# Patient Record
Sex: Female | Born: 1985 | Race: Black or African American | Hispanic: No | Marital: Married | Smoking: Never smoker
Health system: Southern US, Community
[De-identification: ages and names within clinical notes are randomized; demographics above are authoritative.]

## PROBLEM LIST (undated history)

## (undated) ENCOUNTER — Inpatient Hospital Stay (HOSPITAL_COMMUNITY): Payer: Self-pay

## (undated) DIAGNOSIS — D649 Anemia, unspecified: Secondary | ICD-10-CM

## (undated) DIAGNOSIS — E039 Hypothyroidism, unspecified: Secondary | ICD-10-CM

## (undated) DIAGNOSIS — E669 Obesity, unspecified: Secondary | ICD-10-CM

## (undated) DIAGNOSIS — Z832 Family history of diseases of the blood and blood-forming organs and certain disorders involving the immune mechanism: Secondary | ICD-10-CM

## (undated) DIAGNOSIS — Z531 Procedure and treatment not carried out because of patient's decision for reasons of belief and group pressure: Secondary | ICD-10-CM

## (undated) DIAGNOSIS — I10 Essential (primary) hypertension: Secondary | ICD-10-CM

## (undated) DIAGNOSIS — I2699 Other pulmonary embolism without acute cor pulmonale: Principal | ICD-10-CM

## (undated) DIAGNOSIS — R51 Headache: Secondary | ICD-10-CM

## (undated) DIAGNOSIS — Z8249 Family history of ischemic heart disease and other diseases of the circulatory system: Secondary | ICD-10-CM

## (undated) DIAGNOSIS — T7840XA Allergy, unspecified, initial encounter: Secondary | ICD-10-CM

## (undated) DIAGNOSIS — D689 Coagulation defect, unspecified: Secondary | ICD-10-CM

## (undated) DIAGNOSIS — O139 Gestational [pregnancy-induced] hypertension without significant proteinuria, unspecified trimester: Secondary | ICD-10-CM

## (undated) HISTORY — DX: Family history of ischemic heart disease and other diseases of the circulatory system: Z82.49

## (undated) HISTORY — DX: Procedure and treatment not carried out because of patient's decision for reasons of belief and group pressure: Z53.1

## (undated) HISTORY — DX: Other pulmonary embolism without acute cor pulmonale: I26.99

## (undated) HISTORY — PX: WRIST SURGERY: SHX841

## (undated) HISTORY — DX: Obesity, unspecified: E66.9

## (undated) HISTORY — DX: Allergy, unspecified, initial encounter: T78.40XA

## (undated) HISTORY — DX: Anemia, unspecified: D64.9

## (undated) HISTORY — DX: Coagulation defect, unspecified: D68.9

## (undated) HISTORY — PX: WISDOM TOOTH EXTRACTION: SHX21

## (undated) HISTORY — DX: Hypothyroidism, unspecified: E03.9

## (undated) HISTORY — DX: Family history of diseases of the blood and blood-forming organs and certain disorders involving the immune mechanism: Z83.2

---
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
## 2006-01-20 ENCOUNTER — Encounter: Admission: RE | Admit: 2006-01-20 | Discharge: 2006-01-20 | Payer: Self-pay | Admitting: Orthopedic Surgery

## 2006-08-10 ENCOUNTER — Other Ambulatory Visit: Admission: RE | Admit: 2006-08-10 | Discharge: 2006-08-10 | Payer: Self-pay | Admitting: Obstetrics & Gynecology

## 2006-10-19 ENCOUNTER — Other Ambulatory Visit: Admission: RE | Admit: 2006-10-19 | Discharge: 2006-10-19 | Payer: Self-pay | Admitting: Obstetrics & Gynecology

## 2008-01-14 ENCOUNTER — Other Ambulatory Visit: Admission: RE | Admit: 2008-01-14 | Discharge: 2008-01-14 | Payer: Self-pay | Admitting: Obstetrics & Gynecology

## 2008-04-04 ENCOUNTER — Emergency Department (HOSPITAL_COMMUNITY): Admission: EM | Admit: 2008-04-04 | Discharge: 2008-04-05 | Payer: Self-pay | Admitting: Emergency Medicine

## 2008-04-09 ENCOUNTER — Ambulatory Visit (HOSPITAL_COMMUNITY): Admission: RE | Admit: 2008-04-09 | Discharge: 2008-04-09 | Payer: Self-pay | Admitting: Family Medicine

## 2009-02-09 ENCOUNTER — Other Ambulatory Visit: Admission: RE | Admit: 2009-02-09 | Discharge: 2009-02-09 | Payer: Self-pay | Admitting: Obstetrics and Gynecology

## 2009-03-30 ENCOUNTER — Ambulatory Visit (HOSPITAL_COMMUNITY): Admission: RE | Admit: 2009-03-30 | Discharge: 2009-03-30 | Payer: Self-pay | Admitting: Family Medicine

## 2010-10-31 ENCOUNTER — Encounter: Payer: Self-pay | Admitting: Family Medicine

## 2011-02-02 ENCOUNTER — Inpatient Hospital Stay (HOSPITAL_COMMUNITY)
Admission: EM | Admit: 2011-02-02 | Discharge: 2011-02-05 | DRG: 078 | Disposition: A | Discharge status: Home / Self Care | Payer: BC Managed Care – PPO | Attending: Otolaryngology | Admitting: Otolaryngology

## 2011-02-02 ENCOUNTER — Emergency Department (HOSPITAL_COMMUNITY): Payer: BC Managed Care – PPO

## 2011-02-02 DIAGNOSIS — Z309 Encounter for contraceptive management, unspecified: Secondary | ICD-10-CM

## 2011-02-02 DIAGNOSIS — Z975 Presence of (intrauterine) contraceptive device: Secondary | ICD-10-CM

## 2011-02-02 DIAGNOSIS — E669 Obesity, unspecified: Secondary | ICD-10-CM | POA: Diagnosis present

## 2011-02-02 DIAGNOSIS — I2699 Other pulmonary embolism without acute cor pulmonale: Principal | ICD-10-CM | POA: Diagnosis present

## 2011-02-02 DIAGNOSIS — E039 Hypothyroidism, unspecified: Secondary | ICD-10-CM | POA: Diagnosis present

## 2011-02-02 LAB — BASIC METABOLIC PANEL
BUN: 8 mg/dL (ref 6–23)
CO2: 24 mEq/L (ref 19–32)
Calcium: 9.6 mg/dL (ref 8.4–10.5)
Chloride: 103 mEq/L (ref 96–112)
Creatinine, Ser: 0.81 mg/dL (ref 0.4–1.2)
GFR calc Af Amer: >60 mL/min (ref >60)
GFR calc non Af Amer: >60 mL/min (ref >60)
Glucose, Bld: 81 mg/dL (ref 70–99)
Potassium: 4 mEq/L (ref 3.5–5.1)
Sodium: 137 mEq/L (ref 135–145)

## 2011-02-02 LAB — DIFFERENTIAL
Basophils Absolute: 0 10*3/uL (ref 0.0–0.1)
Basophils Relative: 0 % (ref 0–1)
Eosinophils Absolute: 0.1 10*3/uL (ref 0.0–0.7)
Eosinophils Relative: 1 % (ref 0–5)
Lymphocytes Relative: 23 % (ref 12–46)
Lymphs Abs: 2.8 10*3/uL (ref 0.7–4.0)
Monocytes Absolute: 0.7 10*3/uL (ref 0.1–1.0)
Monocytes Relative: 6 % (ref 3–12)
Neutro Abs: 8.4 10*3/uL — ABNORMAL HIGH (ref 1.7–7.7)
Neutrophils Relative %: 70 % (ref 43–77)

## 2011-02-02 LAB — CARDIAC PANEL(CRET KIN+CKTOT+MB+TROPI)
CK, MB: 0.5 ng/mL (ref 0.3–4.0)
Relative Index: INVALID (ref 0.0–2.5)
Total CK: 53 U/L (ref 7–177)
Troponin I: 0.01 ng/mL (ref 0.00–0.06)

## 2011-02-02 LAB — CBC
HCT: 42.1 % (ref 36.0–46.0)
Hemoglobin: 13.7 g/dL (ref 12.0–15.0)
MCH: 27.2 pg (ref 26.0–34.0)
MCHC: 32.5 g/dL (ref 30.0–36.0)
MCV: 83.5 fL (ref 78.0–100.0)
Platelets: 192 10*3/uL (ref 150–400)
RBC: 5.04 MIL/uL (ref 3.87–5.11)
RDW: 14.4 % (ref 11.5–15.5)
WBC: 12 10*3/uL — ABNORMAL HIGH (ref 4.0–10.5)

## 2011-02-02 LAB — D-DIMER, QUANTITATIVE: D-Dimer, Quant: 8.65 ug/mL-FEU — ABNORMAL HIGH (ref 0.00–0.48)

## 2011-02-02 LAB — POCT PREGNANCY, URINE: Preg Test, Ur: NEGATIVE

## 2011-02-02 MED ORDER — IOHEXOL 350 MG/ML SOLN
100.0000 mL | Freq: Once | INTRAVENOUS | Status: AC | PRN
  Administered 2011-02-02: 100 mL via INTRAVENOUS

## 2011-02-03 DIAGNOSIS — I369 Nonrheumatic tricuspid valve disorder, unspecified: Secondary | ICD-10-CM

## 2011-02-03 LAB — TSH: TSH: 4.023 u[IU]/mL (ref 0.350–4.500)

## 2011-02-03 LAB — HEPATIC FUNCTION PANEL
ALT: 17 U/L (ref 0–35)
AST: 13 U/L (ref 0–37)
Albumin: 2.9 g/dL — ABNORMAL LOW (ref 3.5–5.2)
Alkaline Phosphatase: 66 U/L (ref 39–117)
Bilirubin, Direct: <0.1 mg/dL (ref 0.0–0.3)
Total Bilirubin: 0.2 mg/dL — ABNORMAL LOW (ref 0.3–1.2)
Total Protein: 6.8 g/dL (ref 6.0–8.3)

## 2011-02-03 LAB — CBC
HCT: 38.3 % (ref 36.0–46.0)
Hemoglobin: 12.4 g/dL (ref 12.0–15.0)
MCH: 26.8 pg (ref 26.0–34.0)
MCHC: 32.4 g/dL (ref 30.0–36.0)
MCV: 82.7 fL (ref 78.0–100.0)
Platelets: 184 10*3/uL (ref 150–400)
RBC: 4.63 MIL/uL (ref 3.87–5.11)
RDW: 14.1 % (ref 11.5–15.5)
WBC: 10.1 10*3/uL (ref 4.0–10.5)

## 2011-02-03 LAB — CARDIAC PANEL(CRET KIN+CKTOT+MB+TROPI)
CK, MB: 0.9 ng/mL (ref 0.3–4.0)
Relative Index: INVALID (ref 0.0–2.5)
Total CK: 44 U/L (ref 7–177)
Troponin I: 0.02 ng/mL (ref 0.00–0.06)

## 2011-02-03 LAB — PROTIME-INR
INR: 1.07 (ref 0.00–1.49)
Prothrombin Time: 14.1 seconds (ref 11.6–15.2)

## 2011-02-03 LAB — MRSA PCR SCREENING: MRSA by PCR: NEGATIVE

## 2011-02-03 LAB — DIFFERENTIAL
Basophils Absolute: 0 10*3/uL (ref 0.0–0.1)
Basophils Relative: 0 % (ref 0–1)
Eosinophils Absolute: 0.1 10*3/uL (ref 0.0–0.7)
Eosinophils Relative: 1 % (ref 0–5)
Lymphocytes Relative: 33 % (ref 12–46)
Lymphs Abs: 3.3 10*3/uL (ref 0.7–4.0)
Monocytes Absolute: 0.6 10*3/uL (ref 0.1–1.0)
Monocytes Relative: 6 % (ref 3–12)
Neutro Abs: 6.1 10*3/uL (ref 1.7–7.7)
Neutrophils Relative %: 60 % (ref 43–77)

## 2011-02-03 LAB — T4, FREE: Free T4: 1.18 ng/dL (ref 0.80–1.80)

## 2011-02-04 LAB — HOMOCYSTEINE: Homocysteine: 6.5 umol/L (ref 4.0–15.4)

## 2011-02-04 LAB — CBC
HCT: 37.8 % (ref 36.0–46.0)
Hemoglobin: 12.1 g/dL (ref 12.0–15.0)
MCH: 26.5 pg (ref 26.0–34.0)
MCHC: 32 g/dL (ref 30.0–36.0)
MCV: 82.9 fL (ref 78.0–100.0)
Platelets: 208 10*3/uL (ref 150–400)
RBC: 4.56 MIL/uL (ref 3.87–5.11)
RDW: 14.2 % (ref 11.5–15.5)
WBC: 9.6 10*3/uL (ref 4.0–10.5)

## 2011-02-04 LAB — DIFFERENTIAL
Basophils Absolute: 0 10*3/uL (ref 0.0–0.1)
Basophils Relative: 0 % (ref 0–1)
Eosinophils Absolute: 0.1 10*3/uL (ref 0.0–0.7)
Eosinophils Relative: 1 % (ref 0–5)
Lymphocytes Relative: 37 % (ref 12–46)
Lymphs Abs: 3.6 10*3/uL (ref 0.7–4.0)
Monocytes Absolute: 0.6 10*3/uL (ref 0.1–1.0)
Monocytes Relative: 6 % (ref 3–12)
Neutro Abs: 5.3 10*3/uL (ref 1.7–7.7)
Neutrophils Relative %: 55 % (ref 43–77)

## 2011-02-04 LAB — COMPREHENSIVE METABOLIC PANEL
ALT: 16 U/L (ref 0–35)
AST: 12 U/L (ref 0–37)
Albumin: 3 g/dL — ABNORMAL LOW (ref 3.5–5.2)
Alkaline Phosphatase: 63 U/L (ref 39–117)
BUN: 5 mg/dL — ABNORMAL LOW (ref 6–23)
CO2: 24 mEq/L (ref 19–32)
Calcium: 9.1 mg/dL (ref 8.4–10.5)
Chloride: 103 mEq/L (ref 96–112)
Creatinine, Ser: 0.71 mg/dL (ref 0.4–1.2)
GFR calc Af Amer: >60 mL/min (ref >60)
GFR calc non Af Amer: >60 mL/min (ref >60)
Glucose, Bld: 98 mg/dL (ref 70–99)
Potassium: 3.4 mEq/L — ABNORMAL LOW (ref 3.5–5.1)
Sodium: 135 mEq/L (ref 135–145)
Total Bilirubin: 0.2 mg/dL — ABNORMAL LOW (ref 0.3–1.2)
Total Protein: 7 g/dL (ref 6.0–8.3)

## 2011-02-04 LAB — LUPUS ANTICOAGULANT PANEL
DRVVT: 29.7 secs — ABNORMAL LOW (ref 36.2–44.3)
Lupus Anticoagulant: NOT DETECTED
PTT Lupus Anticoagulant: 40.6 secs (ref 30.0–45.6)

## 2011-02-04 LAB — CARDIAC PANEL(CRET KIN+CKTOT+MB+TROPI)
CK, MB: 0.3 ng/mL (ref 0.3–4.0)
Relative Index: INVALID (ref 0.0–2.5)
Total CK: 39 U/L (ref 7–177)
Troponin I: 0.01 ng/mL (ref 0.00–0.06)

## 2011-02-04 LAB — PROTIME-INR
INR: 1.23 (ref 0.00–1.49)
Prothrombin Time: 15.7 seconds — ABNORMAL HIGH (ref 11.6–15.2)

## 2011-02-05 LAB — PROTIME-INR
INR: 1.44 (ref 0.00–1.49)
Prothrombin Time: 17.7 seconds — ABNORMAL HIGH (ref 11.6–15.2)

## 2011-02-05 NOTE — H&P (Signed)
NAME:  ASTRID, VIDES             ACCOUNT NO.:  [id number]  MEDICAL RECORD NO.:  [id number]           PATIENT TYPE:  I  LOCATION:  IC07                          FACILITY:  [facility]  PHYSICIAN:  Tarry Kos, MD       DATE OF BIRTH:  1986-02-01  DATE OF ADMISSION:  02/02/2011 DATE OF DISCHARGE:  LH                             HISTORY & PHYSICAL   CHIEF COMPLAINT:  Right-sided pleuritic chest pain.  HISTORY OF PRESENT ILLNESS:  Ms. Maves is a pleasant 25 year old African American female who presented to the emergency department after starting to experience some right-sided pleuritic chest pain this morning when she woke up.  She said the chest pain is in the right side of the lower chest, radiates from the back to the front of the chest. She has been having some shortness of breath and came to the emergency department for further evaluation.  She herself does not have any history of blood clots or cardiac history in her past.  She denies any recent trauma, any recent traveling.  She is on NuraRing right now and she has been on estrogen birth control pills since she was around 40 years old.  Her mother does have a history of a blood clots in her 36s due to birth control pills.  Ms. Holzheimer came to the emergency department and was found to have bilateral pulmonary emboli.  She denies any fevers.  Again, she denies any recent trauma.  There is no provoking factors that I can find in her history.  REVIEW OF SYSTEMS:  Otherwise, negative.  PAST MEDICAL HISTORY:  Hypothyroidism.  MEDICATIONS:  NuraRing and Synthroid 88 mcg a day.  FAMILY HISTORY:  She does not smoke no, alcohol and no IV drug abuse. Her mother had a blood clot in her 61s.  She was told that it was due to birth control.  She also has 2 aunts that had blood clots in their 20s and 30s.  There is no coagulable disorders that she knows of in her family.  Her primary care physician when she was 25 years old did some sort  of hypercoagulable workup on her before starting her on a birth control and she says it was normal.  ALLERGIES:  None.  PHYSICAL EXAMINATION:  VITALS:  Temperature is 98.6, blood pressure 133/80, pulse 86, respirations 24 and 99% O2 sats on room air. GENERAL:  Alert and oriented x4.  No apparent distress, cooperative and friendly. HEART:  Regular rhythm without murmurs, rubs or gallops. CHEST:  Clear to auscultation bilaterally.  No wheeze, rhonchi, or rales. ABDOMEN:  Soft, nontender and nondistended.  Positive bowel sounds.  No hepatosplenomegaly. EXTREMITIES:  No clubbing, cyanosis, or edema. PSYCH:  Normal affect. NEUROLOGIC:  No focal neurologic deficits. SKIN:  No rashes.  LABORATORY DATA:  White count is 12, hemoglobin is normal.  Urine pregnancy test is negative.  D-dimer was 8.65.  BMP was normal.  CT of her chest shows extensive bilateral pulmonary emboli greater on the right.  Peripheral right lower lobe airspace disease is more worrisome for infarct.  She has been given Lovenox in the  emergency department.  ASSESSMENT AND PLAN:  This is a 25 year old female with acute bilateral pulmonary emboli. Acute bilateral pulmonary emboli extensive.  I am going to place her on Lovenox.  We will make subcu q.12 h. and load her with Coumadin 10 mg p.o. now and in the morning.  Obtain daily INRs and also check an echo for any right heart strain and also serial her cardiac enzymes.  I have asked her to remove her NuraRing and told her that she could be not be on estrogen again in the future.  Also, have gone over the risks and benefits of Coumadin treatment.  She will need at least 6 months to a year of Coumadin treatment to treat this pulmonary emboli and would certainly need followup with a hematologist once she is off of the Lovenox and Coumadin.  When they stop her treatment, I would definitely do an extensive hypercoagulable workup on her as she likely does have an underlying  disorder particularly with her strong family history.  I have also instructed whether she needs to use other forms of birth control as Coumadin does cause the birth defects in pregnancy, she is aware of that.  She has been placed in ICU for very close observation.  She is currently hemodynamically stable with normal vital signs.  Further recommendations depending on overall hospital course.                                           ______________________________ Tarry Kos, MD     RD/MEDQ  D:  02/02/2011  T:  02/03/2011  Job:  [id number]  Electronically Signed by Tarry Kos MD on 02/05/2011 09:47:54 PM

## 2011-02-07 LAB — FACTOR 5 LEIDEN

## 2011-02-08 LAB — CARDIOLIPIN ANTIBODIES, IGG, IGM, IGA
Anticardiolipin IgA: 3 APL U/mL — ABNORMAL LOW (ref <22)
Anticardiolipin IgG: 2 GPL U/mL — ABNORMAL LOW (ref <23)
Anticardiolipin IgM: 4 MPL U/mL — ABNORMAL LOW (ref <11)

## 2011-02-14 NOTE — Discharge Summary (Signed)
NAME:  Morgan Nielsen, Morgan Nielsen             ACCOUNT NO.:  [id number]  MEDICAL RECORD NO.:  [id number]           PATIENT TYPE:  I  LOCATION:  A211                          FACILITY:  [facility]  PHYSICIAN:  Kailiana Granquist L. Lendell Caprice, MDDATE OF BIRTH:  06/02/1986  DATE OF ADMISSION:  02/02/2011 DATE OF DISCHARGE:  LH                              DISCHARGE SUMMARY   DISCHARGE DIAGNOSES: 1. Bilateral pulmonary emboli. 2. Obesity. 3. Hypothyroidism.  DISCHARGE MEDICATIONS: 1. Stop NuvaRing. 2. Stop ibuprofen. 3. Coumadin 5 mg p.o. daily or as directed to keep INR between 2 and     3. 4. Dilaudid 2 mg 1 p.o. q.4 h. p.r.n. pain. 5. Lovenox 120 mg subcutaneously b.i.d. for a minimum of 4 more doses     or until INR is greater than or equal to 2.0. 6. Cetirizine 10 mg a day. 7. Synthroid 88 mcg a day. 8. Xopenex inhaler 2 puffs every 4 h. as needed for shortness of     breath. 9. Tylenol 500 mg p.o. q.8 h. p.r.n. pain. 10.Multivitamin a day.  CONDITION:  Stable.  ACTIVITY:  Increased slowly.  She may return to work next week and follow up with [facility] on Monday for INR check. Please adjust Coumadin as needed to keep INR between 2 and 3.  Follow up with [facility] next week, ward secretary to arrange her anticardiolipin antibody and factor V Leiden are pending.  After 6 months of Coumadin, she will need a full hypercoagulable workup including protein C, protein S and antithrombin III.  CONSULTATIONS:  None.  PROCEDURES:  None.  DIET:  Should be warfarin compatible.  LABORATORY DATA:  Homocystine is 6.  Lupus anticoagulant negative.  CBC on admission; significant for a white blood cell count of 12,000 which normalized.  D-dimer is 8.65, INR on admission was 1.07 at discharge 1.44.  Complete metabolic panel significant for an albumin of 2.9. Cardiac enzymes negative.  TSH 4.023.  Urine pregnancy negative.  DIAGNOSTICS:  CT angiogram of the chest showed  extensive bilateral pulmonary emboli, extensive air space opacity in the right lower lobe which could be infarct pneumonia or aspiration.  Rib films, no fracture. Echocardiogram showed no right heart strain and was normal.  HISTORY AND HOSPITAL COURSE:  Please see H and P for details.  Ms. Morgan Nielsen is a pleasant 25 year old white female with history of hypothyroidism who presented with pleuritic right-sided chest pain.  She was on a NuvaRing prior to admission.  She has no personal history of thromboembolism, but her mother and aunts had blood clots.  She was found to have pulmonary emboli and was started on Lovenox and Coumadin. Her hypercoagulable workup thus far is negative.  Protein C, protein S and antithrombin III were not done because she had already received anticoagulation.  Her pain improved.  Her shortness of breath improved. She was able to ambulate without hypoxia.  She did have some problems with nausea which were pain medication related.  After changing to Dilaudid, she had no further nausea.  She will stop her NuvaRing and use an alternate non hormonal form of birth control.  She also has a followup appointment with her OB/GYN next month..  Total time on the day of discharge is greater than 30 minutes.     Jayquan Bradsher L. Lendell Caprice, MD     CLS/MEDQ  D:  02/05/2011  T:  02/05/2011  Job:  [id number]  cc:   Corrie Mckusick, M.D. Fax: [phone]  Electronically Signed by Crista Curb MD on 02/14/2011 08:06:10 AM

## 2011-04-01 ENCOUNTER — Ambulatory Visit (HOSPITAL_COMMUNITY): Payer: BC Managed Care – PPO

## 2011-04-04 ENCOUNTER — Ambulatory Visit (HOSPITAL_COMMUNITY): Payer: BC Managed Care – PPO | Admitting: Oncology

## 2011-04-18 ENCOUNTER — Emergency Department: Payer: Self-pay | Admitting: Emergency Medicine

## 2011-05-09 ENCOUNTER — Encounter (HOSPITAL_COMMUNITY): Payer: Self-pay

## 2011-05-09 ENCOUNTER — Encounter (HOSPITAL_COMMUNITY): Payer: BC Managed Care – PPO | Attending: Oncology

## 2011-05-09 VITALS — BP 120/81 | HR 69 | Temp 99.3°F | Ht 69.5 in | Wt 252.6 lb

## 2011-05-09 DIAGNOSIS — D649 Anemia, unspecified: Secondary | ICD-10-CM

## 2011-05-09 DIAGNOSIS — I2699 Other pulmonary embolism without acute cor pulmonale: Secondary | ICD-10-CM

## 2011-05-09 HISTORY — DX: Other pulmonary embolism without acute cor pulmonale: I26.99

## 2011-05-09 NOTE — Patient Instructions (Signed)
[facility]  Discharge Instructions  RECOMMENDATIONS MADE BY THE CONSULTANT AND ANY TEST RESULTS WILL BE SENT TO YOUR REFERRING DOCTOR.         SPECIAL INSTRUCTIONS/FOLLOW-UP:Return to clinic Wed 8/1 at 9:20 for lab work. We will schedule CT Scans prior to MD appt on 8/13.   I acknowledge that I have been informed and understand all the instructions given to me and received a copy. I do not have any more questions at this time, but understand that I may call the Specialty Clinic at [facility] at [phone] during business hours should I have any further questions or need assistance in obtaining follow-up care.    __________________________________________  _____________  __________ Signature of Patient or Authorized Representative            Date                   Time    __________________________________________ Nurse's Signature

## 2011-05-09 NOTE — Progress Notes (Signed)
Notes dictated

## 2011-05-10 ENCOUNTER — Other Ambulatory Visit (HOSPITAL_COMMUNITY): Payer: Self-pay | Admitting: *Deleted

## 2011-05-10 DIAGNOSIS — I2699 Other pulmonary embolism without acute cor pulmonale: Secondary | ICD-10-CM

## 2011-05-10 NOTE — Progress Notes (Signed)
CC:   Morgan Nielsen, M.D.  REFERRING PHYSICIAN:  Dr. Phillips Odor  IDENTIFYING STATEMENT:  The patient is a 25 year old woman seen at the request of Dr. Phillips Odor with pulmonary embolism.  HISTORY OF PRESENT ILLNESS:  The patient came to medical attention on January 31 2009.  She had presented with a right-sided pleuritic-type chest pain of sudden onset, radiating from the back to the her chest. This was associated with shortness of breath.  She went on to receive a CT angio on 02/02/2011 that showed a pulmonary embolism in both main pulmonary arteries associated with cardiomegaly.  There was also extensive bilateral pulmonary embolus, greater on the right, and small bilateral pleural infusions.  The patient was admitted and anticoagulated with Lovenox which was eventually bridged to Coumadin. Her INR levels are monitored through at Dr. Lamar Blinks is office.  The patient was had been on oral contraceptive pill for 3 years prior to her diagnosis.  She has since discontinued.  The patient also reports that once she was anticoagulated with Coumadin she had noted a very heavy menses recent, so severe that she stopped her Coumadin for 2 weeks.  She does give a history of having menorrhagia in the past thus was placed on the oral contraceptive pill to control her bleeding better.  She recalls a gynecology evaluation and denies having a history of fibroids.  As  result of this she has anemia.  When she was 25 years old, her primary care physician had performed a hypercoagulable workup at that time prior to starting her on birth control.  The patient reports that that this was normal.  Her family history is pertinent in that she has several family members with blood clots.  Her mother has 6 siblings who have had a clotting disorder of some sort.  Her mother and herself has also had a blood clot.  However, no one carries a genetic diagnosis.  PAST MEDICAL HISTORY: 1. Anemia. 2.  Hypothyroidism.  ALLERGIES:  None.  MEDICATIONS:  Iron 66 mg daily, melatonin 1 mg daily, cetirizine 10 mg daily, Benadryl 25 mg daily, levothyroxine 88 mcg daily, multivitamins 1 tablet daily, and Coumadin 5 mg currently.  Grapeseed 50 mg daily.  SOCIAL HISTORY:  The patient is married and has no children.  She works as a Engineer, civil (consulting) in [employer].  She denies alcohol or tobacco use.  FAMILY HISTORY:  Noted above.  Her mother including all her 7 siblings have had blood clots found.  Denies family history for oncologic malignancies.  Denies family history for von Willebrand disease.  The patient's maternal grandfather had prostate cancer.  HEALTH MAINTENANCE:  Receives healthcare through Dr. Lamar Blinks office.  REVIEW OF SYSTEMS:  Notes shortness of breath on extreme exertion.  Also notes some mild abdominal pain.  Has positive history for heavy menses whilst on Coumadin.  Her last Pap smear, per patient, was unremarkable. Constitutional:  Denies fever, chills, night sweats, anorexia, or weight loss.  Cardiovascular:  Denies chest pain, PND, orthopnea, or ankle swelling.  Respirations:  Denies cough, hemoptysis, wheeze, or shortness of breath.  GI:  Denies nausea, vomiting, abdominal pain, diarrhea, melena, or hematochezia.  GU:  Denies dysuria, hematuria, nocturia, or frequency.  Skin:  No bruising or bleeding.  Neurologic:  Denies headaches, vision change, or extremity weakness.  PHYSICAL EXAMINATION:  General:  The patient is alert and oriented x3. Vitals:  Pulse 69, blood pressure 120/81, temperature 99.3, respirations 16, and weight 252 pounds.  HEENT:  Head is atraumatic,  normocephalic. Extraocular muscles intact.  Sclerae anicteric.  Pupils are equal, round, and reactive to light.  Mouth moist without ulcerations or thrush or lesions.  Neck:  Supple without adenopathy and trachea center. Chest:  Demonstrates good air entry bilaterally, clear to both auscultation.  Cardiovascular  exam:  Reveals first and heart sounds to be present.  No added sounds or murmurs.  Abdomen:  Obese, soft, and nontender.  Bowel sounds present.  Extremities:  No edema.  Pulses present and symmetrical.  CNS:  There is no focal neurological deficit.  IMPRESSION AND PLAN:  Mrs. Morgan Nielsen is a pleasant 25 year old woman who was initially diagnosed with bilateral pulmonary emboli and is currently Anticoagulated. Her blood clots were likely provoked by the oral contraceptive pill which she has since discontinued.  She also has risk factors that also includes a family history; a number of her aunts and Uncles, and her mother, had clotting disorders.  However, genetic testing had been negative thus far.  The patient's INR is currently monitored through Dr. Lamar Blinks office, and the patient states that her INRs have been therapeutic.  She notes in particular, very heavy menses whilst on Coumadin.  The patient was told that because she was on Coumadin hypercoagulable workup will be limited as some of the clotting factors can be effected by Coumadin.  However, we will proceed and perform a limited hypercoagulable panel.  We will have her obtain a factor V Leiden and prothrombin gene mutation, lupus anticoagulant, Cardiolipin, IgG, IgA, and IgM antibodies, beta 2 glycoprotein IgG, IgA and IgM antibodies, and a factor VIII level.  Because of heavy menses, we will also have her obtain von Willebrand panel.  She has history of anemia, and we will have her obtain iron studies.  She also notes shortness of breath and some chest discomfort, so will repeat a CT angiogram.  It also makes sense to obtain CT scans of the abdomen and pelvis.  The patient will return to discuss results.    ______________________________ Laurice Record, M.D. LIO/MEDQ  D:  05/09/2011  T:  05/09/2011  Job:  [id number]

## 2011-05-11 ENCOUNTER — Encounter (HOSPITAL_COMMUNITY): Payer: BC Managed Care – PPO | Attending: Oncology

## 2011-05-11 DIAGNOSIS — I2699 Other pulmonary embolism without acute cor pulmonale: Secondary | ICD-10-CM

## 2011-05-11 LAB — CBC
HCT: 39.7 % (ref 36.0–46.0)
Hemoglobin: 12.7 g/dL (ref 12.0–15.0)
MCH: 26.5 pg (ref 26.0–34.0)
MCHC: 32 g/dL (ref 30.0–36.0)
MCV: 82.9 fL (ref 78.0–100.0)
Platelets: 221 10*3/uL (ref 150–400)
RBC: 4.79 MIL/uL (ref 3.87–5.11)
RDW: 15.2 % (ref 11.5–15.5)
WBC: 6.3 10*3/uL (ref 4.0–10.5)

## 2011-05-11 LAB — COMPREHENSIVE METABOLIC PANEL
ALT: 15 U/L (ref 0–35)
AST: 16 U/L (ref 0–37)
Albumin: 3.6 g/dL (ref 3.5–5.2)
Alkaline Phosphatase: 78 U/L (ref 39–117)
BUN: 8 mg/dL (ref 6–23)
CO2: 23 mEq/L (ref 19–32)
Calcium: 9.8 mg/dL (ref 8.4–10.5)
Chloride: 104 mEq/L (ref 96–112)
Creatinine, Ser: 0.68 mg/dL (ref 0.50–1.10)
GFR calc Af Amer: >60 mL/min (ref >60)
GFR calc non Af Amer: >60 mL/min (ref >60)
Glucose, Bld: 85 mg/dL (ref 70–99)
Potassium: 3.8 mEq/L (ref 3.5–5.1)
Sodium: 139 mEq/L (ref 135–145)
Total Bilirubin: 0.2 mg/dL — ABNORMAL LOW (ref 0.3–1.2)
Total Protein: 7.8 g/dL (ref 6.0–8.3)

## 2011-05-11 LAB — IRON AND TIBC
Iron: 49 ug/dL (ref 42–135)
Saturation Ratios: 18 % — ABNORMAL LOW (ref 20–55)
TIBC: 271 ug/dL (ref 250–470)
UIBC: 222 ug/dL

## 2011-05-11 LAB — DIFFERENTIAL
Basophils Absolute: 0 10*3/uL (ref 0.0–0.1)
Basophils Relative: 1 % (ref 0–1)
Eosinophils Absolute: 0.1 10*3/uL (ref 0.0–0.7)
Eosinophils Relative: 1 % (ref 0–5)
Lymphocytes Relative: 47 % — ABNORMAL HIGH (ref 12–46)
Lymphs Abs: 2.9 10*3/uL (ref 0.7–4.0)
Monocytes Absolute: 0.3 10*3/uL (ref 0.1–1.0)
Monocytes Relative: 5 % (ref 3–12)
Neutro Abs: 3 10*3/uL (ref 1.7–7.7)
Neutrophils Relative %: 47 % (ref 43–77)

## 2011-05-11 LAB — FERRITIN: Ferritin: 51 ng/mL (ref 10–291)

## 2011-05-11 LAB — LACTATE DEHYDROGENASE: LDH: 102 U/L (ref 94–250)

## 2011-05-11 LAB — PROTIME-INR
INR: 1.27 (ref 0.00–1.49)
Prothrombin Time: 16.2 seconds — ABNORMAL HIGH (ref 11.6–15.2)

## 2011-05-11 NOTE — Progress Notes (Signed)
Labs drawn today for cbc/diff,pt,cmp,ldh,lupus anticoag,cardio. Antibody, Beta 2 glycoprotein, Factor 5 Leiden, Prothrombin Gene, Von Willebrand, Iron and IBC, Ferr

## 2011-05-12 LAB — BETA-2-GLYCOPROTEIN I ABS, IGG/M/A
Beta-2 Glyco I IgG: 0 G Units (ref <20)
Beta-2-Glycoprotein I IgA: 1 A Units (ref <20)
Beta-2-Glycoprotein I IgM: 0 M Units (ref <20)

## 2011-05-12 LAB — LUPUS ANTICOAGULANT PANEL
DRVVT: 38.5 secs (ref 36.2–44.3)
Lupus Anticoagulant: NOT DETECTED
PTT Lupus Anticoagulant: 46.3 secs — ABNORMAL HIGH (ref 30.0–45.6)
PTTLA 4:1 Mix: 40.7 secs (ref 30.0–45.6)

## 2011-05-12 LAB — CARDIOLIPIN ANTIBODIES, IGG, IGM, IGA
Anticardiolipin IgA: 1 APL U/mL — ABNORMAL LOW (ref <22)
Anticardiolipin IgG: 2 GPL U/mL — ABNORMAL LOW (ref <23)
Anticardiolipin IgM: 4 MPL U/mL — ABNORMAL LOW (ref <11)

## 2011-05-16 LAB — FACTOR 5 LEIDEN

## 2011-05-17 ENCOUNTER — Other Ambulatory Visit (HOSPITAL_COMMUNITY): Payer: Self-pay | Admitting: *Deleted

## 2011-05-17 ENCOUNTER — Other Ambulatory Visit (HOSPITAL_COMMUNITY): Payer: Self-pay | Admitting: Hematology and Oncology

## 2011-05-17 ENCOUNTER — Telehealth (HOSPITAL_COMMUNITY): Payer: Self-pay | Admitting: Oncology

## 2011-05-17 DIAGNOSIS — I2699 Other pulmonary embolism without acute cor pulmonale: Secondary | ICD-10-CM

## 2011-05-17 LAB — PROTHROMBIN GENE MUTATION

## 2011-05-18 ENCOUNTER — Ambulatory Visit (HOSPITAL_COMMUNITY): Payer: BC Managed Care – PPO

## 2011-05-18 ENCOUNTER — Ambulatory Visit (HOSPITAL_COMMUNITY)
Admission: RE | Admit: 2011-05-18 | Discharge: 2011-05-18 | Disposition: A | Discharge status: Home / Self Care | Payer: BC Managed Care – PPO | Source: Ambulatory Visit | Attending: Hematology and Oncology | Admitting: Hematology and Oncology

## 2011-05-18 ENCOUNTER — Telehealth (HOSPITAL_COMMUNITY): Payer: Self-pay | Admitting: *Deleted

## 2011-05-18 DIAGNOSIS — R109 Unspecified abdominal pain: Secondary | ICD-10-CM | POA: Insufficient documentation

## 2011-05-18 DIAGNOSIS — Z86711 Personal history of pulmonary embolism: Secondary | ICD-10-CM | POA: Insufficient documentation

## 2011-05-18 DIAGNOSIS — I2699 Other pulmonary embolism without acute cor pulmonale: Secondary | ICD-10-CM

## 2011-05-18 DIAGNOSIS — R0602 Shortness of breath: Secondary | ICD-10-CM | POA: Insufficient documentation

## 2011-05-18 LAB — VON WILLEBRAND FACTOR MULTIMER
Factor-VIII Activity: 173 % (ref 50–180)
Ristocetin Co-Factor: 197 % (ref 42–200)
Von Willebrand Factor Ag: 229 % — ABNORMAL HIGH (ref 50–217)

## 2011-05-18 MED ORDER — IOHEXOL 350 MG/ML SOLN
120.0000 mL | Freq: Once | INTRAVENOUS | Status: AC | PRN
  Administered 2011-05-18: 120 mL via INTRAVENOUS

## 2011-05-23 ENCOUNTER — Encounter (HOSPITAL_COMMUNITY): Payer: BC Managed Care – PPO

## 2011-05-23 ENCOUNTER — Ambulatory Visit (HOSPITAL_COMMUNITY): Payer: BC Managed Care – PPO

## 2011-05-26 ENCOUNTER — Encounter (HOSPITAL_COMMUNITY): Payer: Self-pay | Admitting: Oncology

## 2011-05-26 ENCOUNTER — Encounter (HOSPITAL_BASED_OUTPATIENT_CLINIC_OR_DEPARTMENT_OTHER): Payer: BC Managed Care – PPO | Admitting: Oncology

## 2011-05-26 VITALS — BP 120/76 | HR 76 | Temp 98.2°F | Wt 257.2 lb

## 2011-05-26 DIAGNOSIS — I2699 Other pulmonary embolism without acute cor pulmonale: Secondary | ICD-10-CM

## 2011-05-26 NOTE — Patient Instructions (Signed)
[facility]  Discharge Instructions  RECOMMENDATIONS MADE BY THE CONSULTANT AND ANY TEST RESULTS WILL BE SENT TO YOUR REFERRING DOCTOR.   EXAM FINDINGS BY MD TODAY AND SIGNS AND SYMPTOMS TO REPORT TO CLINIC OR PRIMARY MD:   Exam good. Return in 3 to 4 months to see PA. Please follow up with primary care md regarding the management of your coumadin. Please call us if you have any questions/problems.   I acknowledge that I have been informed and understand all the instructions given to me and received a copy. I do not have any more questions at this time, but understand that I may call the Specialty Clinic at [facility] at [phone] during business hours should I have any further questions or need assistance in obtaining follow-up care.    __________________________________________  _____________  __________ Signature of Patient or Authorized Representative            Date                   Time    __________________________________________ Nurse's Signature

## 2011-05-26 NOTE — Progress Notes (Signed)
Morgan Ribas, MD [address] [place] [address]  1. Pulmonary embolism     CURRENT THERAPY: On Coumadin anticoagulation  INTERVAL HISTORY: Morgan Nielsen 25 y.o. female returns for  regular  visit for followup of pulmonary embolism.  The patient is here for follow-up of the lab work and radiographic studies that were performed recently.  The patient was seen by Dr. Dalene Nielsen on 05/09/11.    The patient does not have any complaints presently.  The patient denies any shortness of breath, pleuritic chest pain, bowel or urinary difficulties.  The patient explains that her PCP, Dr. Genia Nielsen, is managing her Coumadin dose.  Her last INR in [facility] was not therapeutic.  However, that was from 05/11/2011.     The patient reports some short-term memory loss associated with the initiation of Coumadin.  I told her that that is not a typical side effect of coumadin.  She reports that sometimes she has a difficult time remembering someone's name that she should know.  She says that she forgets where she is going when she is driving.  I have asked her to follow-up with her PCP regarding this.  She denies any headaches, double vision, difficulty finding words, speech slurring, or other neurological symptoms.  Past Medical History  Diagnosis Date  . Allergy   . Anemia   . Asthma   . Clotting disorder   . Thyroid disease   . Pulmonary embolism 05/09/2011    has Pulmonary embolism on her problem list.     is allergic to shellfish allergy.  Morgan Nielsen does not currently have medications on file.  Past Surgical History  Procedure Date  . Wrist surgery     left torn cartilage 2007     denies any headaches, dizziness, double vision, fevers, chills, night sweats, nausea, vomiting, diarrhea, constipation, chest pain, heart palpitations, shortness of breath, blood in stool, black tarry stool, urinary pain, urinary burning, urinary frequency, hematuria.   PHYSICAL  EXAMINATION  Filed Vitals:   05/26/11 1535  BP: 120/76  Pulse: 76  Temp: 98.2 F (36.8 C)    GENERAL:alert, healthy, well nourished, well developed, comfortable, cooperative, obese and smiling SKIN: skin color, texture, turgor are normal, no rashes or significant lesions HEAD: Normocephalic, No masses, lesions, tenderness or abnormalities EYES: normal EARS: External ears normal OROPHARYNX:mucous membranes are moist  NECK: trachea midline LYMPH:  not examined BREAST:not examined LUNGS: clear to auscultation and percussion HEART: regular rate & rhythm, no murmurs, no gallops, S1 normal and S2 normal ABDOMEN:abdomen soft, non-tender and normal bowel sounds BACK: Back symmetric, no curvature., No CVA tenderness EXTREMITIES:less then 2 second capillary refill, no joint deformities, effusion, or inflammation, no edema, no skin discoloration, no clubbing, no cyanosis, negative homan's sign B/L.  No LE tenderness.  NEURO: alert & oriented x 3 with fluent speech, no focal motor/sensory deficits, gait normal    LABORATORY DATA: CBC    Component Value Date/Time   WBC 6.3 05/11/2011 1000   RBC 4.79 05/11/2011 1000   HGB 12.7 05/11/2011 1000   HCT 39.7 05/11/2011 1000   PLT 221 05/11/2011 1000   MCV 82.9 05/11/2011 1000   MCH 26.5 05/11/2011 1000   MCHC 32.0 05/11/2011 1000   RDW 15.2 05/11/2011 1000   LYMPHSABS 2.9 05/11/2011 1000   MONOABS 0.3 05/11/2011 1000   EOSABS 0.1 05/11/2011 1000   BASOSABS 0.0 05/11/2011 1000    Lab Results  Component Value Date   INR  1.27 05/11/2011   INR 1.44 02/05/2011   INR 1.23 02/04/2011   RADIOGRAPHIC STUDIES:  05/18/2011  *RADIOLOGY REPORT*  Clinical Data: Shortness of breath with abdominal pain and anemia.  History of pulmonary emboli with a clotting disorder.  CT ANGIOGRAPHY CHEST WITH CONTRAST  Technique: Multidetector CT imaging of the chest was performed  using the standard protocol during bolus administration of  intravenous contrast. Multiplanar CT image  reconstructions  including MIPs were obtained to evaluate the vascular anatomy.  Contrast: 120 ml Omnipaque 3 feet date.  Comparison: CT chest 02/02/2011.  Findings: No pulmonary embolus. Heart size normal. No pericardial  effusion. No pathologically enlarged mediastinal, hilar or  axillary lymph nodes. No pleural fluid.  Mild patchy ground-glass seen predominantly in both lower lobes  shows interval clearing on the the abdomen pelvis portion of the  exam. No pleural fluid. Airway is unremarkable.  Review of the MIP images confirm see above findings.  IMPRESSION:  No pulmonary embolus.  CT ABDOMEN AND PELVIS WITH CONTRAST  Technique: Multidetector CT imaging of the abdomen and pelvis was  performed using the standard protocol following bolus  administration of intravenous contrast.  Findings: Liver, gallbladder, adrenal glands, kidneys, spleen,  pancreas, stomach and small bowel are unremarkable. A fair amount  of stool is seen in the colon. Scattered lymph nodes are not  enlarged by CT size criteria. Uterus and ovaries are visualized.  No free fluid. No worrisome lytic or sclerotic lesions.  IMPRESSION:  1. No acute findings.  2. Suspect constipation.  Original Report Authenticated By: Reyes Ivan, M.D.    ASSESSMENT:  1. Pulmonary Embolism, on Coumadin anticoagulation managed by PCP 2. Hypothyroidism   PLAN:  1. Continue Coumadin therapy as directed by PCP.  The patient understands that she will be on anticoagulation for one years time. 2. I personally reviewed and went over laboratory results with the patient. 3. I personally reviewed and went over radiographic studies with the patient. 4. Return in 3-4 months time for follow-up. 5. A full discussion of the nature of anticoagulants has been carried out.  A benefit risk analysis has been presented to the patient, so that they understand the justification for choosing anticoagulation at this time. The need for frequent  and regular monitoring, precise dosage adjustment and compliance is stressed.  Side effects of potential bleeding are discussed.  The patient should avoid any OTC items containing aspirin or ibuprofen, and should avoid great swings in general diet.  Avoid alcohol consumption.  Call if any signs of abnormal bleeding, shortness of breath, or chest pain; or present to the ER if office is closed.   All questions were answered. The patient knows to call the clinic with any problems, questions or concerns. We can certainly see the patient much sooner if necessary.  The patient and plan discussed with Glenford Peers, MD and he is in agreement with the aforementioned.   KEFALAS,THOMAS

## 2011-07-26 NOTE — Telephone Encounter (Signed)
Just trying to close the encounter. 

## 2011-09-13 ENCOUNTER — Encounter (HOSPITAL_COMMUNITY): Payer: Self-pay | Admitting: Oncology

## 2011-09-13 ENCOUNTER — Encounter (HOSPITAL_COMMUNITY): Payer: BC Managed Care – PPO | Attending: Oncology | Admitting: Oncology

## 2011-09-13 VITALS — BP 124/72 | HR 79 | Temp 98.3°F | Ht 69.0 in | Wt 243.2 lb

## 2011-09-13 DIAGNOSIS — Z531 Procedure and treatment not carried out because of patient's decision for reasons of belief and group pressure: Secondary | ICD-10-CM

## 2011-09-13 DIAGNOSIS — Z8249 Family history of ischemic heart disease and other diseases of the circulatory system: Secondary | ICD-10-CM

## 2011-09-13 DIAGNOSIS — Z86718 Personal history of other venous thrombosis and embolism: Secondary | ICD-10-CM

## 2011-09-13 DIAGNOSIS — I2699 Other pulmonary embolism without acute cor pulmonale: Secondary | ICD-10-CM

## 2011-09-13 DIAGNOSIS — E039 Hypothyroidism, unspecified: Secondary | ICD-10-CM

## 2011-09-13 DIAGNOSIS — E669 Obesity, unspecified: Secondary | ICD-10-CM

## 2011-09-13 DIAGNOSIS — Z832 Family history of diseases of the blood and blood-forming organs and certain disorders involving the immune mechanism: Secondary | ICD-10-CM

## 2011-09-13 HISTORY — DX: Family history of diseases of the blood and blood-forming organs and certain disorders involving the immune mechanism: Z83.2

## 2011-09-13 HISTORY — DX: Obesity, unspecified: E66.9

## 2011-09-13 HISTORY — DX: Procedure and treatment not carried out because of patient's decision for reasons of belief and group pressure: Z53.1

## 2011-09-13 HISTORY — DX: Hypothyroidism, unspecified: E03.9

## 2011-09-13 HISTORY — DX: Family history of ischemic heart disease and other diseases of the circulatory system: Z82.49

## 2011-09-13 NOTE — Progress Notes (Signed)
Morgan Ribas, MD [address] [place] [address]  1. Pulmonary embolism  Antithrombin III, Protein C activity, Protein C, total, Protein S activity, Protein S, total, Lupus anticoagulant panel, Beta-2-glycoprotein i abs, IgG/M/A, Homocysteine, serum, Factor 5 leiden, Prothrombin gene mutation, Cardiolipin antibodies, IgG, IgM, IgA  2. Religious or spiritual beliefs affecting medical care    3. Hypothyroidism    4. Obese    5. Family history of blood clots    6. Family history of blood disorder      CURRENT THERAPY: Anticoagulation with Coumadin followed by PCP  INTERVAL HISTORY: Morgan Nielsen 25 y.o. female returns for  regular  visit for followup of H/O PE likely secondary to birth control.  She has stopped her birth control.  Interestingly, she does have a family history of blood clots/blood dyscrasias.  Her hypercoaguable panel was negative, however, she was on Coumadin therapy at the time.  A repeat will be performed in the future when Coumadin is discontinued.  She is very interested in discontinuing her Coumadin anticoagulation because she and her husband wish to start a family.   Her CT angio of chest performed in 05/2011 revealed resolution of blood clot.  She was initially diagnosed in April 2012 wit extensive PE.  She has completed 6 months worth of anticoagulation.  At this time, it was decided to stop Coumadin anticoagulation.  We will repeat her hypercoaguable panel after the Holiday season.  When I asked the patient if she has plan for Christmas, she reported that she does not celebrate Christmas.  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  I have asked the patient to refrain from unprotected sex until all results are in so she has all information that may affect her offspring.  She has agreed to this.    We spent some time going over signs and symptoms of PE and DVT.  These are medical emergencies and she is to report to the ER if she  experiences any unilateral edema, heat, pain, shortness of breath, chest pain.  She understands this.  Past Medical History  Diagnosis Date  . Allergy   . Anemia   . Asthma   . Clotting disorder   . Thyroid disease   . Pulmonary embolism 05/09/2011  . Religious or spiritual beliefs affecting medical care 09/13/2011  . Hypothyroidism 09/13/2011  . Obese 09/13/2011  . Family history of blood clots 09/13/2011  . Family history of blood disorder 09/13/2011    has Pulmonary embolism; Religious or spiritual beliefs affecting medical care; Hypothyroidism; Obese; Family history of blood clots; and Family history of blood disorder on her problem list.     is allergic to shellfish allergy.  [name] does not currently have medications on file.  Past Surgical History  Procedure Date  . Wrist surgery     left torn cartilage 2007    Denies any headaches, dizziness, double vision, fevers, chills, night sweats, nausea, vomiting, diarrhea, constipation, chest pain, heart palpitations, shortness of breath, blood in stool, black tarry stool, urinary pain, urinary burning, urinary frequency, hematuria.   PHYSICAL EXAMINATION  ECOG PERFORMANCE STATUS: 0 - Asymptomatic  Filed Vitals:   09/13/11 0947  BP: 124/72  Pulse: 79  Temp: 98.3 F (36.8 C)    GENERAL:alert, no distress, well nourished, well developed, comfortable, cooperative, obese and smiling SKIN: skin color, texture, turgor are normal HEAD: Normocephalic EYES: normal EARS: External ears normal OROPHARYNX:mucous membranes are moist  NECK: supple,  trachea midline LYMPH:  no palpable lymphadenopathy, no hepatosplenomegaly BREAST:not examined LUNGS: clear to auscultation and percussion HEART: regular rate & rhythm, no murmurs, no gallops, S1 normal and S2 normal ABDOMEN:abdomen soft, non-tender, obese, normal bowel sounds and no hepatosplenomegaly BACK: Back symmetric, no curvature., No CVA tenderness EXTREMITIES:less then 2  second capillary refill, no joint deformities, effusion, or inflammation, no edema, no skin discoloration, no clubbing, no cyanosis  NEURO: alert & oriented x 3 with fluent speech, no focal motor/sensory deficits, gait normal   RADIOGRAPHIC STUDIES:  05/18/2011  *RADIOLOGY REPORT*  Clinical Data: Shortness of breath with abdominal pain and anemia.  History of pulmonary emboli with a clotting disorder.  CT ANGIOGRAPHY CHEST WITH CONTRAST  Technique: Multidetector CT imaging of the chest was performed  using the standard protocol during bolus administration of  intravenous contrast. Multiplanar CT image reconstructions  including MIPs were obtained to evaluate the vascular anatomy.  Contrast: 120 ml Omnipaque 3 feet date.  Comparison: CT chest 02/02/2011.  Findings: No pulmonary embolus. Heart size normal. No pericardial  effusion. No pathologically enlarged mediastinal, hilar or  axillary lymph nodes. No pleural fluid.  Mild patchy ground-glass seen predominantly in both lower lobes  shows interval clearing on the the abdomen pelvis portion of the  exam. No pleural fluid. Airway is unremarkable.  Review of the MIP images confirm see above findings.  IMPRESSION:  No pulmonary embolus.  CT ABDOMEN AND PELVIS WITH CONTRAST  Technique: Multidetector CT imaging of the abdomen and pelvis was  performed using the standard protocol following bolus  administration of intravenous contrast.  Findings: Liver, gallbladder, adrenal glands, kidneys, spleen,  pancreas, stomach and small bowel are unremarkable. A fair amount  of stool is seen in the colon. Scattered lymph nodes are not  enlarged by CT size criteria. Uterus and ovaries are visualized.  No free fluid. No worrisome lytic or sclerotic lesions.  IMPRESSION:  1. No acute findings.  2. Suspect constipation.  Original Report Authenticated By: Morgan Nielsen, M.D.   02/02/2011  *RADIOLOGY REPORT*  Clinical Data: Right side chest  pain. Cough. Elevated D-dimer.  CT ANGIOGRAPHY CHEST WITH CONTRAST  Technique: Multidetector CT imaging of the chest was performed  using the standard protocol during bolus administration of  intravenous contrast. Multiplanar CT image reconstructions  including MIPs were obtained to evaluate the vascular anatomy.  Contrast: 100 ml Omnipaque-300.  Comparison: Plain film of the chest earlier this same date.  Findings: Study is positive for pulmonary embolus with clot  identified in both main pulmonary arteries. Large volume of clot  is seen in both descending interlobar arteries, greater on the  right. There is cardiomegaly. No axillary, hilar or mediastinal  lymphadenopathy. Small bilateral pleural effusions noted. No  pericardial effusion. Lungs demonstrate extensive airspace opacity  in the right lower lobe which could be due to infarct, pneumonia or  possibly aspiration. Mild dependent atelectasis is seen in the  left lung base.  Incidentally imaged upper abdomen is unremarkable. No focal bony  abnormality.  Review of the MIP images confirms the above findings.  IMPRESSION:  1. Extensive bilateral pulmonary embolus, greater on the right.  2. Peripheral right lower lobe airspace disease is most worrisome  for infarct given high clot burden on the right. Aspiration and  pneumonia are within the differential.  3. Critical test results telephoned to Dr. Bebe Shaggy at the time of  interpretation on 02/02/2011 at 6:40 p.m.  Original Report Authenticated By: [id number]    ASSESSMENT:  1. PE, S/P 6 months of Coumadin anticoagulation with CT angio of chest revealing resolution of PE in 05/2011.  This is likely secondary to birth control medication 2. Jehovah's Witness 3. Obese 4. Family history of blood clots/dyscrasias 5. Hypothyroidism   PLAN:  1. Patient has completed 6 months of anticoagulation.  The patient will stop anticoagulation at this time.  She had resolution of the blood clot  noted on follow-up CT angio of chest in August 2012.  She wishes to start a family.   2. Lab work in 1 month: Hypercoaguable panel. 3. Return end of Jan 2013 4. I have asked the patient to refrain from unprotected sex until she is seen at the end of Jan 2013. 5. Discussed signs and symptoms of DVT and PE so she can recognize the symptoms.  She is to report to ER if she experiences any of the symptoms. 6. I personally reviewed and went over radiographic studies with the patient. 7. I personally reviewed and went over laboratory results with the patient.  All questions were answered. The patient knows to call the clinic with any problems, questions or concerns. We can certainly see the patient much sooner if necessary.   Morgan Nielsen

## 2011-09-13 NOTE — Patient Instructions (Signed)
Morgan Nielsen  [id number] 02-May-1986 [facility]  Discharge Instructions  RECOMMENDATIONS MADE BY THE CONSULTANT AND ANY TEST RESULTS WILL BE SENT TO YOUR REFERRING DOCTOR.   EXAM FINDINGS BY MD TODAY AND SIGNS AND SYMPTOMS TO REPORT TO CLINIC OR PRIMARY MD: Will stop your coumadin, you have been on it longer than 6 months.  Need to check a hypercoaguable panel in January.  Use birth control until after your next visit here.  MEDICATIONS PRESCRIBED: none    INSTRUCTIONS GIVEN AND DISCUSSED: Other :  Report shortness of breath, extremity swelling, etc.  SPECIAL INSTRUCTIONS/FOLLOW-UP: Lab work Needed: in January  Other (Referral/Appointments) after labs in January.   I acknowledge that I have been informed and understand all the instructions given to me and received a copy. I do not have any more questions at this time, but understand that I may call the Specialty Clinic at [facility] at [phone] during business hours should I have any further questions or need assistance in obtaining follow-up care.    __________________________________________  _____________  __________ Signature of Patient or Authorized Representative            Date                   Time    __________________________________________ Nurse's Signature

## 2011-09-16 ENCOUNTER — Other Ambulatory Visit (HOSPITAL_COMMUNITY): Payer: Self-pay | Admitting: Family Medicine

## 2011-09-16 DIAGNOSIS — E039 Hypothyroidism, unspecified: Secondary | ICD-10-CM

## 2011-09-19 ENCOUNTER — Other Ambulatory Visit (HOSPITAL_COMMUNITY): Payer: BC Managed Care – PPO

## 2011-09-20 ENCOUNTER — Ambulatory Visit (HOSPITAL_COMMUNITY)
Admission: RE | Admit: 2011-09-20 | Discharge: 2011-09-20 | Disposition: A | Discharge status: Home / Self Care | Payer: BC Managed Care – PPO | Source: Ambulatory Visit | Attending: Family Medicine | Admitting: Family Medicine

## 2011-09-20 DIAGNOSIS — R599 Enlarged lymph nodes, unspecified: Secondary | ICD-10-CM | POA: Insufficient documentation

## 2011-09-20 DIAGNOSIS — E039 Hypothyroidism, unspecified: Secondary | ICD-10-CM | POA: Insufficient documentation

## 2011-10-17 ENCOUNTER — Other Ambulatory Visit (HOSPITAL_COMMUNITY): Payer: BC Managed Care – PPO

## 2011-10-18 ENCOUNTER — Encounter (HOSPITAL_COMMUNITY): Payer: BC Managed Care – PPO | Attending: Oncology

## 2011-10-18 DIAGNOSIS — I2699 Other pulmonary embolism without acute cor pulmonale: Secondary | ICD-10-CM

## 2011-10-18 LAB — ANTITHROMBIN III: AntiThromb III Func: 91 % (ref 75–120)

## 2011-10-18 NOTE — Progress Notes (Signed)
Labs drawn today for Hypercoag. panel

## 2011-10-19 LAB — HOMOCYSTEINE: Homocysteine: 6.7 umol/L (ref 4.0–15.4)

## 2011-10-19 LAB — LUPUS ANTICOAGULANT PANEL
DRVVT: 35.2 secs (ref 34.1–42.2)
Lupus Anticoagulant: NOT DETECTED
PTT Lupus Anticoagulant: 36.2 secs (ref 28.0–43.0)

## 2011-10-19 LAB — PROTEIN S, TOTAL: Protein S Ag, Total: 71 % (ref 60–150)

## 2011-10-19 LAB — PROTEIN C ACTIVITY: Protein C Activity: 134 % — ABNORMAL HIGH (ref 75–133)

## 2011-10-19 LAB — PROTEIN C, TOTAL: Protein C, Total: 68 % — ABNORMAL LOW (ref 72–160)

## 2011-10-19 LAB — PROTEIN S ACTIVITY: Protein S Activity: 58 % — ABNORMAL LOW (ref 69–129)

## 2011-10-20 LAB — CARDIOLIPIN ANTIBODIES, IGG, IGM, IGA
Anticardiolipin IgA: 2 APL U/mL — ABNORMAL LOW (ref <22)
Anticardiolipin IgG: 2 GPL U/mL — ABNORMAL LOW (ref <23)
Anticardiolipin IgM: 5 MPL U/mL — ABNORMAL LOW (ref <11)

## 2011-10-20 LAB — BETA-2-GLYCOPROTEIN I ABS, IGG/M/A
Beta-2 Glyco I IgG: 0 G Units (ref <20)
Beta-2-Glycoprotein I IgA: 0 A Units (ref <20)
Beta-2-Glycoprotein I IgM: 3 M Units (ref <20)

## 2011-10-20 LAB — FACTOR 5 LEIDEN

## 2011-10-21 ENCOUNTER — Other Ambulatory Visit (HOSPITAL_COMMUNITY): Payer: Self-pay | Admitting: Oncology

## 2011-10-21 DIAGNOSIS — I2699 Other pulmonary embolism without acute cor pulmonale: Secondary | ICD-10-CM

## 2011-10-21 LAB — PROTHROMBIN GENE MUTATION

## 2011-11-08 ENCOUNTER — Ambulatory Visit (HOSPITAL_COMMUNITY): Payer: BC Managed Care – PPO | Admitting: Oncology

## 2011-11-08 ENCOUNTER — Encounter (HOSPITAL_COMMUNITY): Payer: Self-pay | Admitting: Oncology

## 2011-11-08 ENCOUNTER — Encounter (HOSPITAL_BASED_OUTPATIENT_CLINIC_OR_DEPARTMENT_OTHER): Payer: BC Managed Care – PPO | Admitting: Oncology

## 2011-11-08 VITALS — BP 120/81 | HR 67 | Temp 98.0°F | Ht 69.0 in | Wt 239.0 lb

## 2011-11-08 DIAGNOSIS — Z8249 Family history of ischemic heart disease and other diseases of the circulatory system: Secondary | ICD-10-CM

## 2011-11-08 DIAGNOSIS — D6859 Other primary thrombophilia: Secondary | ICD-10-CM

## 2011-11-08 DIAGNOSIS — I2699 Other pulmonary embolism without acute cor pulmonale: Secondary | ICD-10-CM

## 2011-11-08 DIAGNOSIS — Z832 Family history of diseases of the blood and blood-forming organs and certain disorders involving the immune mechanism: Secondary | ICD-10-CM

## 2011-11-08 NOTE — Progress Notes (Signed)
Morgan Ribas, MD, MD [address] [place] [address]  1. Pulmonary embolism  CHASTE TREE PO, ECHINACEA PO, Protein S activity, Protein S, total, Protein C activity, Protein C, total, Protein C activity, Protein C activity, Protein C, total, Protein C, total, Protein S activity, Protein S activity, Protein S, total, Protein S, total  2. Protein S deficiency    3. Family history of blood clots      CURRENT THERAPY:S/P 6 months of Coumadin anticoagulation for extensive B/L PE on 02/02/2011 in the setting of birth control with NuvaRing.   INTERVAL HISTORY: Morgan Nielsen 26 y.o. female returns for  regular  visit for followup of PE in the setting of birth control (NuvaRing) and 6 months worth of anticoagulation.  Following completion of her 6 months worth of anticoagulation with Coumadin, a hypercoagulable panel was performed (one month from completion of Coumadin).  The results follow, but of note, the patient's Protein S activity was decreased, and Protein C total was low with an elevated activity.  We spent some time going over patient education regarding protein S deficiency and its implications. I personally reviewed and went over laboratory results with the patient.  The patient reports that when she presented with the PE she had severe shortness of breath, despite a normal pulse Oximetry level, and pleuritic chest pain.    It should be noted again that the patient has a strong family history of PE and DVT.  The patient's mother has 6 siblings and out of the 7 children total, 6 have had a DVT or PE.  One out of the two men had a thrombus.  The patient's mother had a PE while on birth control.  The patient is young and of course has aspirations to create a family.  We do not believe the patient requires anticoagulation at this time, but when she pursues the creation of family and pregnancy, she may require anticoagulation.  In light of the patient's age, we  will set-up a referral for the patient to be seen at [facility] for a second opinion.  Otherwise, the patient denies any complaints.  ROS questioning is negative.      Past Medical History  Diagnosis Date  . Allergy   . Anemia   . Asthma   . Clotting disorder   . Thyroid disease   . Pulmonary embolism 05/09/2011  . Religious or spiritual beliefs affecting medical care 09/13/2011  . Hypothyroidism 09/13/2011  . Obese 09/13/2011  . Family history of blood clots 09/13/2011  . Family history of blood disorder 09/13/2011  . Protein S deficiency 11/08/2011    has Pulmonary embolism; Religious or spiritual beliefs affecting medical care; Hypothyroidism; Obese; Family history of blood clots; Family history of blood disorder; and Protein S deficiency on her problem list.     is allergic to shellfish allergy.  Ms. Denson does not currently have medications on file.  Past Surgical History  Procedure Date  . Wrist surgery     left torn cartilage 2007    Denies any headaches, dizziness, double vision, fevers, chills, night sweats, nausea, vomiting, diarrhea, constipation, chest pain, heart palpitations, shortness of breath, blood in stool, black tarry stool, urinary pain, urinary burning, urinary frequency, hematuria.   PHYSICAL EXAMINATION  ECOG PERFORMANCE STATUS: 0 - Asymptomatic  Filed Vitals:   11/08/11 0900  BP: 120/81  Pulse: 67  Temp: 98 F (36.7 C)    GENERAL:alert, healthy, no distress, well  nourished, well developed, comfortable, cooperative, obese and smiling SKIN: skin color, texture, turgor are normal, no rashes or significant lesions HEAD: Normocephalic, No masses, lesions, tenderness or abnormalities EYES: normal EARS: External ears normal OROPHARYNX:mucous membranes are moist  NECK: supple, no adenopathy, no bruits, thyroid normal size, non-tender, without nodularity, no stridor, non-tender, trachea midline LYMPH:  no palpable lymphadenopathy, no  hepatosplenomegaly BREAST:not examined LUNGS: clear to auscultation and percussion HEART: regular rate & rhythm, no murmurs, no gallops, S1 normal and S2 normal ABDOMEN:abdomen soft, non-tender, obese, normal bowel sounds, no masses or organomegaly and no hepatosplenomegaly BACK: Back symmetric, no curvature., No CVA tenderness EXTREMITIES:less then 2 second capillary refill, no joint deformities, effusion, or inflammation, no edema, no skin discoloration, no clubbing, no cyanosis  NEURO: alert & oriented x 3 with fluent speech, no focal motor/sensory deficits, gait normal   LABORATORY DATA:  Results for ANEA, FODERA (MRN [id number]) as of 11/08/2011 12:57  Ref. Range 05/11/2011 10:00 05/18/2011 07:43 05/18/2011 08:47 09/20/2011 16:01 10/18/2011 10:26  Anticardiolipin IgA Latest Range: <22 APL U/mL 1 (L)    2 (L)  Anticardiolipin IgG Latest Range: <23 GPL U/mL 2 (L)    2 (L)  Anticardiolipin IgM Latest Range: <11 MPL U/mL 4 (L)    5 (L)  PTT Lupus Anticoagulant Latest Range: 28.0-43.0 secs 46.3 (H)    36.2  PTTLA Confirmation Latest Range: <8.0 secs NOT APPL    NOT APPL  PTTLA 4:1 Mix Latest Range: 28.0-43.0 secs 40.7    NOT APPL  DRVVT Latest Range: 34.1-42.2 secs 38.5    35.2  Drvvt confirmation Latest Range: <1.16 Ratio NOT APPL    NOT APPL  dRVVT Incubated 1:1 Mix Latest Range: 34.1-42.2 secs NOT APPL    NOT APPL  Lupus Anticoagulant Latest Range: NOT DETECTED  NOT DETECTED    NOT DETECTED  Beta-2 Glyco I IgG Latest Range: <20 G Units 0    0  Beta-2-Glycoprotein I IgA Latest Range: <20 A Units 1    0  Beta-2-Glycoprotein I IgM Latest Range: <20 M Units 0    3  Factor-VIII Activity Latest Range: 50-180 % 173      Von Willebrand Factor Ag Latest Range: 50-217 % 229 (H)      Von Willebrand Multimers No range found REPORT      Interpretation No range found REPORT      AntiThromb III Func Latest Range: 75-120 %     91  Ristocetin Co-Factor Latest Range: 42-200 % 197        Recommendations-F5LEID: No range found (NOTE)    (NOTE)  Recommendations-PTGENE: No range found (NOTE)    (NOTE)  Protein C Activity Latest Range: 75-133 %     134 (H)  Protein C, Total Latest Range: 72-160 %     68 (L)  Protein S Activity Latest Range: 69-129 %     58 (L)  Protein S Ag, Total Latest Range: 60-150 %     71     RADIOGRAPHIC STUDIES:  05/18/2011  *RADIOLOGY REPORT*  Clinical Data: Shortness of breath with abdominal pain and anemia.  History of pulmonary emboli with a clotting disorder.  CT ANGIOGRAPHY CHEST WITH CONTRAST  Technique: Multidetector CT imaging of the chest was performed  using the standard protocol during bolus administration of  intravenous contrast. Multiplanar CT image reconstructions  including MIPs were obtained to evaluate the vascular anatomy.  Contrast: 120 ml Omnipaque 3 feet date.  Comparison: CT chest 02/02/2011.  Findings:  No pulmonary embolus. Heart size normal. No pericardial  effusion. No pathologically enlarged mediastinal, hilar or  axillary lymph nodes. No pleural fluid.  Mild patchy ground-glass seen predominantly in both lower lobes  shows interval clearing on the the abdomen pelvis portion of the  exam. No pleural fluid. Airway is unremarkable.  Review of the MIP images confirm see above findings.  IMPRESSION:  No pulmonary embolus.  CT ABDOMEN AND PELVIS WITH CONTRAST  Technique: Multidetector CT imaging of the abdomen and pelvis was  performed using the standard protocol following bolus  administration of intravenous contrast.  Findings: Liver, gallbladder, adrenal glands, kidneys, spleen,  pancreas, stomach and small bowel are unremarkable. A fair amount  of stool is seen in the colon. Scattered lymph nodes are not  enlarged by CT size criteria. Uterus and ovaries are visualized.  No free fluid. No worrisome lytic or sclerotic lesions.  IMPRESSION:  1. No acute findings.  2. Suspect constipation.  Original Report  Authenticated By: Reyes Ivan, M.D.  02/02/2011  *RADIOLOGY REPORT*  Clinical Data: Right side chest pain. Cough. Elevated D-dimer.  CT ANGIOGRAPHY CHEST WITH CONTRAST  Technique: Multidetector CT imaging of the chest was performed  using the standard protocol during bolus administration of  intravenous contrast. Multiplanar CT image reconstructions  including MIPs were obtained to evaluate the vascular anatomy.  Contrast: 100 ml Omnipaque-300.  Comparison: Plain film of the chest earlier this same date.  Findings: Study is positive for pulmonary embolus with clot  identified in both main pulmonary arteries. Large volume of clot  is seen in both descending interlobar arteries, greater on the  right. There is cardiomegaly. No axillary, hilar or mediastinal  lymphadenopathy. Small bilateral pleural effusions noted. No  pericardial effusion. Lungs demonstrate extensive airspace opacity  in the right lower lobe which could be due to infarct, pneumonia or  possibly aspiration. Mild dependent atelectasis is seen in the  left lung base.  Incidentally imaged upper abdomen is unremarkable. No focal bony  abnormality.  Review of the MIP images confirms the above findings.  IMPRESSION:  1. Extensive bilateral pulmonary embolus, greater on the right.  2. Peripheral right lower lobe airspace disease is most worrisome  for infarct given high clot burden on the right. Aspiration and  pneumonia are within the differential.  3. Critical test results telephoned to Dr. Bebe Shaggy at the time of  interpretation on 02/02/2011 at 6:40 p.m.  Original Report Authenticated By: [id number]         ASSESSMENT:  1. PE in setting of birth control, namely NuvaRing, diagnosed on CT angio in April 2012 when she presented with severe SOB and pleuritic pain.  S/P 6 months of Coumadin anticoagulation with CT angio of chest revealing resolution of PE in 05/2011. This is likely secondary to birth control medication    2. Jehovah's Witness  3. Obesity  4. Family history of blood clots/dyscrasias on mother's side of family.  Mother had PE while on birth control and 5 out of 6 of mother's sibling's had DVT or PE, even one brother.   5. Hypothyroidism    PLAN:  1. Lab work today: Protein C total and activity, Protein S total and activity  2. Avoid birth control 3. Avoid food products with estrogen including soy products 4. Recommend protected intercourse at this time. 5. Will review CT angio of chest from April 2012 and August 2012 with Dr. Mariel Sleet. 6. Will ascertain a second opinion from [facility] in light of  her young age, her desire to have children, and recommendations regarding long-term management.  7. Return in 6 months for follow-up.    All questions were answered. The patient knows to call the clinic with any problems, questions or concerns. We can certainly see the patient much sooner if necessary.  The patient and plan discussed with Glenford Peers, MD and he is in agreement with the aforementioned. Patient seen and examined by Dr. Glenford Peers.   I spent 25 minutes counseling the patient face to face. The total time spent in the appointment was 30 minutes.  KEFALAS,THOMAS

## 2011-11-08 NOTE — Patient Instructions (Signed)
Morgan Nielsen  [id number] 1986/08/16   [facility]  Discharge Instructions  RECOMMENDATIONS MADE BY THE CONSULTANT AND ANY TEST RESULTS WILL BE SENT TO YOUR REFERRING DOCTOR.   EXAM FINDINGS BY MD TODAY AND SIGNS AND SYMPTOMS TO REPORT TO CLINIC OR PRIMARY MD: Will check some labs today and will make a referral to [facility].  If you have not heard anything by Friday call us Tobie Lords, RN [phone]).  Report any clots or other problems.  MEDICATIONS PRESCRIBED: none      SPECIAL INSTRUCTIONS/FOLLOW-UP: Return to Clinic:  In 6 months.   I acknowledge that I have been informed and understand all the instructions given to me and received a copy. I do not have any more questions at this time, but understand that I may call the Specialty Clinic at [facility] at [phone] during business hours should I have any further questions or need assistance in obtaining follow-up care.    __________________________________________  _____________  __________ Signature of Patient or Authorized Representative            Date                   Time    __________________________________________ Nurse's Signature

## 2011-11-09 ENCOUNTER — Other Ambulatory Visit (HOSPITAL_COMMUNITY): Payer: BC Managed Care – PPO

## 2011-11-09 LAB — PROTEIN C ACTIVITY: Protein C Activity: 162 % — ABNORMAL HIGH (ref 75–133)

## 2011-11-09 LAB — PROTEIN C, TOTAL: Protein C, Total: 135 % (ref 72–160)

## 2011-11-09 LAB — PROTEIN S ACTIVITY: Protein S Activity: 70 % (ref 69–129)

## 2011-11-09 LAB — PROTEIN S, TOTAL: Protein S Ag, Total: 109 % (ref 60–150)

## 2011-11-11 ENCOUNTER — Other Ambulatory Visit (HOSPITAL_COMMUNITY): Payer: Self-pay | Admitting: Oncology

## 2011-11-11 DIAGNOSIS — I2699 Other pulmonary embolism without acute cor pulmonale: Secondary | ICD-10-CM

## 2011-11-11 DIAGNOSIS — D6859 Other primary thrombophilia: Secondary | ICD-10-CM

## 2011-11-15 ENCOUNTER — Ambulatory Visit (HOSPITAL_COMMUNITY): Payer: BC Managed Care – PPO | Admitting: Oncology

## 2012-01-04 ENCOUNTER — Other Ambulatory Visit (HOSPITAL_COMMUNITY): Payer: BC Managed Care – PPO

## 2012-04-30 ENCOUNTER — Encounter (HOSPITAL_COMMUNITY): Payer: BC Managed Care – PPO | Attending: Oncology

## 2012-04-30 DIAGNOSIS — E669 Obesity, unspecified: Secondary | ICD-10-CM | POA: Insufficient documentation

## 2012-04-30 DIAGNOSIS — J45909 Unspecified asthma, uncomplicated: Secondary | ICD-10-CM | POA: Insufficient documentation

## 2012-04-30 DIAGNOSIS — E039 Hypothyroidism, unspecified: Secondary | ICD-10-CM | POA: Insufficient documentation

## 2012-04-30 DIAGNOSIS — D6859 Other primary thrombophilia: Secondary | ICD-10-CM | POA: Insufficient documentation

## 2012-04-30 DIAGNOSIS — Z09 Encounter for follow-up examination after completed treatment for conditions other than malignant neoplasm: Secondary | ICD-10-CM | POA: Insufficient documentation

## 2012-04-30 DIAGNOSIS — I2699 Other pulmonary embolism without acute cor pulmonale: Secondary | ICD-10-CM

## 2012-04-30 DIAGNOSIS — Z86711 Personal history of pulmonary embolism: Secondary | ICD-10-CM | POA: Insufficient documentation

## 2012-04-30 LAB — COMPREHENSIVE METABOLIC PANEL
ALT: 13 U/L (ref 0–35)
AST: 17 U/L (ref 0–37)
Albumin: 3.5 g/dL (ref 3.5–5.2)
Alkaline Phosphatase: 60 U/L (ref 39–117)
BUN: 14 mg/dL (ref 6–23)
CO2: 27 mEq/L (ref 19–32)
Calcium: 9.8 mg/dL (ref 8.4–10.5)
Chloride: 104 mEq/L (ref 96–112)
Creatinine, Ser: 0.7 mg/dL (ref 0.50–1.10)
GFR calc Af Amer: >90 mL/min (ref >90)
GFR calc non Af Amer: >90 mL/min (ref >90)
Glucose, Bld: 83 mg/dL (ref 70–99)
Potassium: 3.7 mEq/L (ref 3.5–5.1)
Sodium: 139 mEq/L (ref 135–145)
Total Bilirubin: 0.2 mg/dL — ABNORMAL LOW (ref 0.3–1.2)
Total Protein: 7.2 g/dL (ref 6.0–8.3)

## 2012-04-30 LAB — DIFFERENTIAL
Basophils Absolute: 0 10*3/uL (ref 0.0–0.1)
Basophils Relative: 0 % (ref 0–1)
Eosinophils Absolute: 0.1 10*3/uL (ref 0.0–0.7)
Eosinophils Relative: 2 % (ref 0–5)
Lymphocytes Relative: 32 % (ref 12–46)
Lymphs Abs: 2.1 10*3/uL (ref 0.7–4.0)
Monocytes Absolute: 0.3 10*3/uL (ref 0.1–1.0)
Monocytes Relative: 5 % (ref 3–12)
Neutro Abs: 4 10*3/uL (ref 1.7–7.7)
Neutrophils Relative %: 61 % (ref 43–77)

## 2012-04-30 LAB — CBC
HCT: 39.1 % (ref 36.0–46.0)
Hemoglobin: 12.7 g/dL (ref 12.0–15.0)
MCH: 27.4 pg (ref 26.0–34.0)
MCHC: 32.5 g/dL (ref 30.0–36.0)
MCV: 84.4 fL (ref 78.0–100.0)
Platelets: 217 10*3/uL (ref 150–400)
RBC: 4.63 MIL/uL (ref 3.87–5.11)
RDW: 14.2 % (ref 11.5–15.5)
WBC: 6.6 10*3/uL (ref 4.0–10.5)

## 2012-04-30 NOTE — Progress Notes (Signed)
Labs drawn today for cbc/diff,cmp Protein C and S

## 2012-05-01 LAB — PROTEIN S ACTIVITY: Protein S Activity: 89 % (ref 69–129)

## 2012-05-01 LAB — PROTEIN C ACTIVITY: Protein C Activity: 175 % — ABNORMAL HIGH (ref 75–133)

## 2012-05-02 LAB — PROTEIN S, TOTAL: Protein S Ag, Total: 83 % (ref 60–150)

## 2012-05-02 LAB — PROTEIN C, TOTAL: Protein C, Total: 93 % (ref 72–160)

## 2012-05-02 IMAGING — CR DG RIBS W/ CHEST 3+V*R*
5 series · 5 of 5 positions shown · non-contrast
Comparison: None

CLINICAL DATA: Chest pain and right rib pain.

RIGHT RIBS AND CHEST - 3+ VIEW

[view not recorded (1 of 5)]
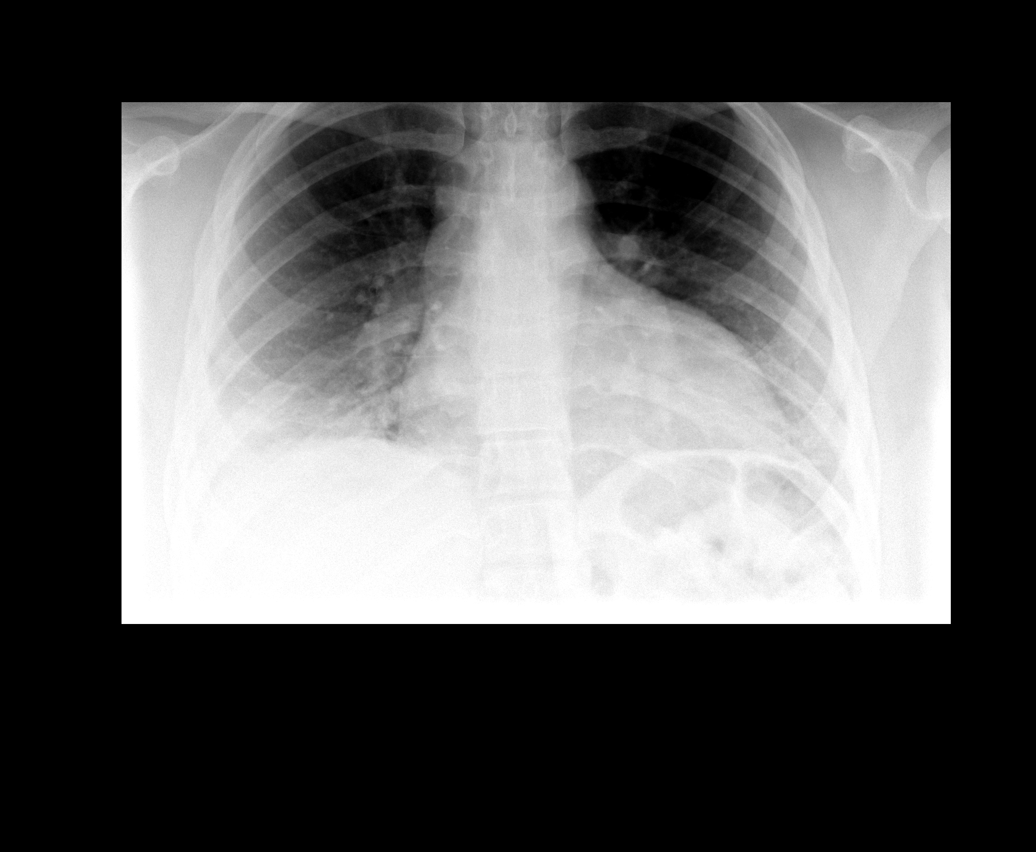

[view not recorded (2 of 5)]
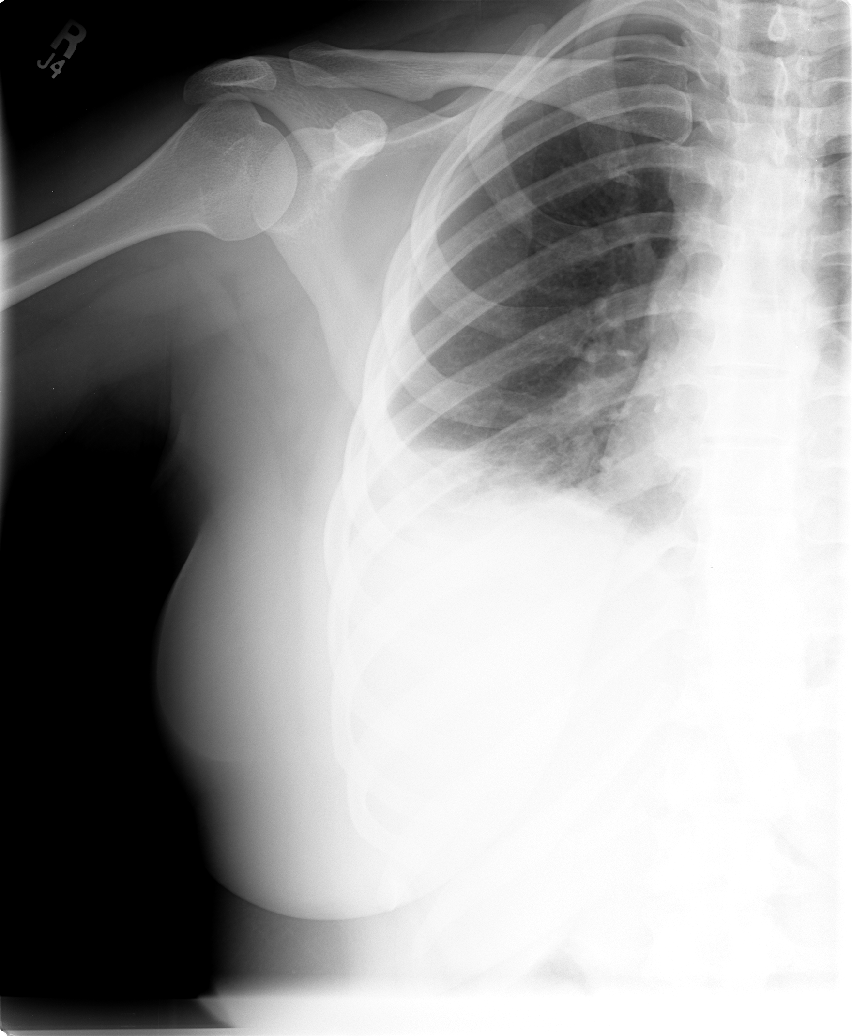

[view not recorded (3 of 5)]
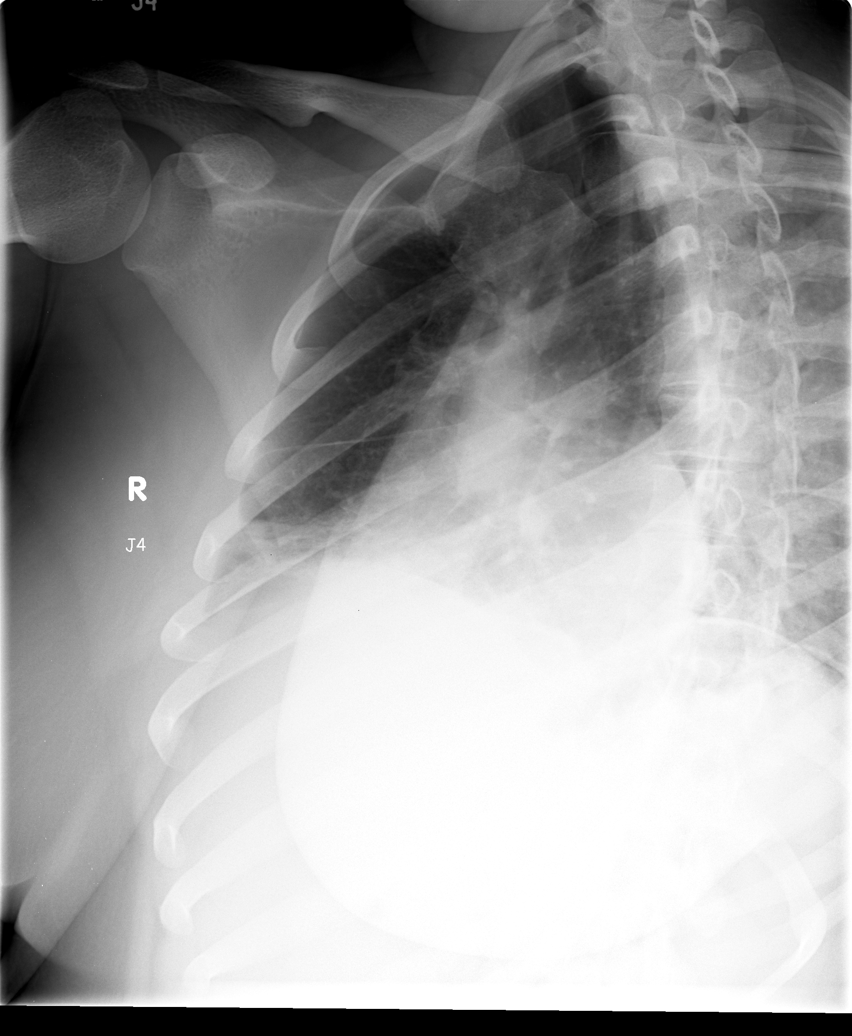

[view not recorded (4 of 5)]
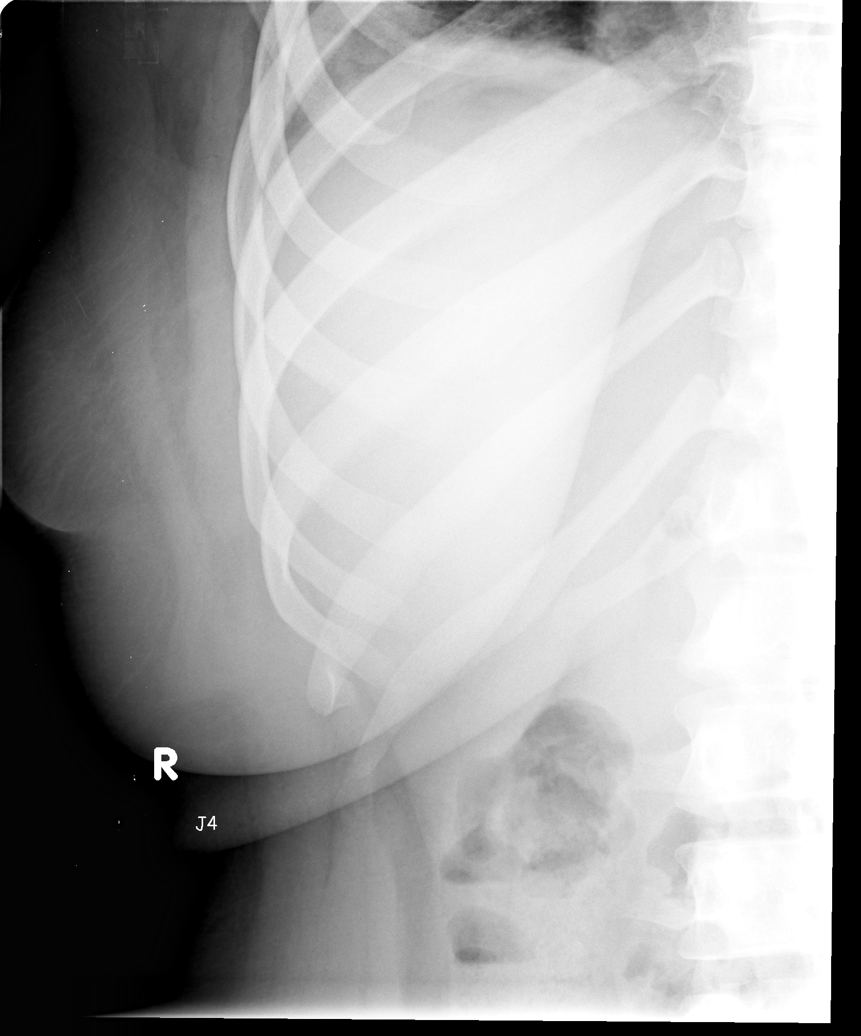

[view not recorded (5 of 5)]
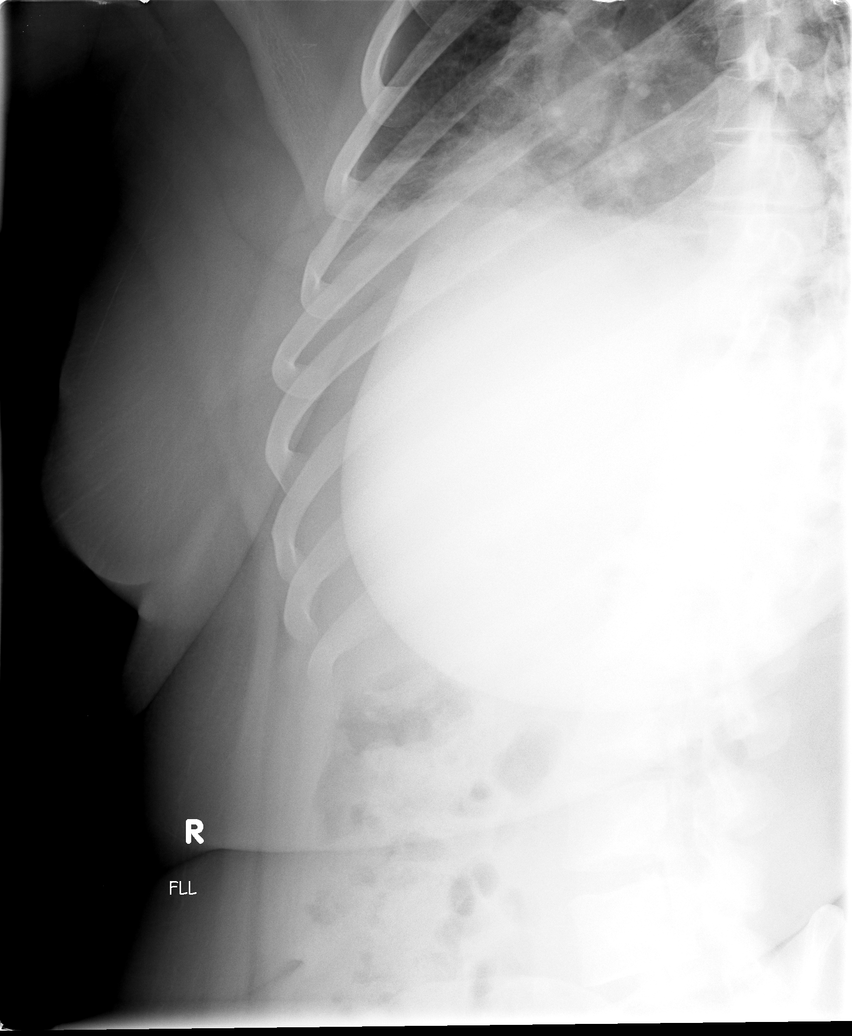

[5 of 5 positions shown; findings below may reference images not displayed]

FINDINGS: Low lung volumes are noted with vascular crowding and
bibasilar atelectasis, right greater than left.  Heart size is
accentuated.  There may be a small right pleural effusion also.  No
definite right-sided rib fractures are identified.  No
pneumothorax.
IMPRESSION: 1.  Low lung volumes with vascular crowding and bibasilar
atelectasis, right greater than left.
2.  No definite acute right-sided rib fractures.

## 2012-05-07 ENCOUNTER — Encounter (HOSPITAL_BASED_OUTPATIENT_CLINIC_OR_DEPARTMENT_OTHER): Payer: BC Managed Care – PPO | Admitting: Oncology

## 2012-05-07 VITALS — BP 138/87 | HR 76 | Temp 98.4°F | Wt 258.2 lb

## 2012-05-07 DIAGNOSIS — E039 Hypothyroidism, unspecified: Secondary | ICD-10-CM

## 2012-05-07 DIAGNOSIS — I2699 Other pulmonary embolism without acute cor pulmonale: Secondary | ICD-10-CM

## 2012-05-07 DIAGNOSIS — Z86718 Personal history of other venous thrombosis and embolism: Secondary | ICD-10-CM

## 2012-05-07 DIAGNOSIS — Z832 Family history of diseases of the blood and blood-forming organs and certain disorders involving the immune mechanism: Secondary | ICD-10-CM

## 2012-05-07 DIAGNOSIS — Z309 Encounter for contraceptive management, unspecified: Secondary | ICD-10-CM

## 2012-05-07 NOTE — Patient Instructions (Addendum)
BURMA KETCHER  [id number] 1986-06-05 Dr. Glenford Peers   [facility]  Discharge Instructions  RECOMMENDATIONS MADE BY THE CONSULTANT AND ANY TEST RESULTS WILL BE SENT TO YOUR REFERRING DOCTOR.   EXAM FINDINGS BY MD TODAY AND SIGNS AND SYMPTOMS TO REPORT TO CLINIC OR PRIMARY MD: Discussion per PA.  We will discharge you from the clinic.  We are available if needed in the future.  MEDICATIONS PRESCRIBED: none   INSTRUCTIONS GIVEN AND DISCUSSED: Report any unusual  Shortness of breath or chest pain.  Go to ED immediately.  SPECIAL INSTRUCTIONS/FOLLOW-UP: Return to Clinic only as needed.   I acknowledge that I have been informed and understand all the instructions given to me and received a copy. I do not have any more questions at this time, but understand that I may call the Specialty Clinic at [facility] at [phone] during business hours should I have any further questions or need assistance in obtaining follow-up care.    __________________________________________  _____________  __________ Signature of Patient or Authorized Representative            Date                   Time    __________________________________________ Nurse's Signature

## 2012-05-07 NOTE — Progress Notes (Signed)
Morgan Ribas, MD [address] [place] [address]  1. Pulmonary embolism     CURRENT THERAPY: Observation  INTERVAL HISTORY: Morgan Nielsen 26 y.o. female returns for  regular  visit for followup of PE in the setting of birth control (NuvaRing) and 6 months worth of anticoagulation. S/P 6 months of Coumadin anticoagulation for extensive B/L PE on 02/02/2011 in the setting of birth control with NuvaRing.   Now that she is out over 6 months of anticoagulation a repeat of her protein S and C was performed.  Both total and activity of both have normalized and are within normal limits except for the Protein C activity which is elevated.  I personally reviewed and went over laboratory results with the patient.  Morgan Nielsen denies any complaints today.  On our last encounter, the patient expressed her desire to start a family.  From our standpoint, she is now free to do so.  She was encouraged to follow-up closely with OB/GYN if she were to become pregnant.  Prophylactic anticoagulation is not indicated at this time.   Past Medical History  Diagnosis Date  . Allergy   . Anemia   . Asthma   . Clotting disorder   . Thyroid disease   . Pulmonary embolism 05/09/2011  . Religious or spiritual beliefs affecting medical care 09/13/2011  . Hypothyroidism 09/13/2011  . Obese 09/13/2011  . Family history of blood clots 09/13/2011  . Family history of blood disorder 09/13/2011  . 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     is allergic to shellfish allergy.  Ms. Nott had no medications administered during this visit.  Past Surgical History  Procedure Date  . Wrist surgery     left torn cartilage 2007    Denies any headaches, dizziness, double vision, fevers, chills, night sweats,  nausea, vomiting, diarrhea, constipation, chest pain, heart palpitations, shortness of breath, blood in stool, black tarry stool, urinary pain, urinary burning, urinary frequency, hematuria.   PHYSICAL EXAMINATION  ECOG PERFORMANCE STATUS: 0 - Asymptomatic  Filed Vitals:   05/07/12 1024  BP: 138/87  Pulse: 76  Temp: 98.4 F (36.9 C)    GENERAL:alert, no distress, well nourished, well developed, comfortable, cooperative, obese and smiling SKIN: skin color, texture, turgor are normal, no rashes or significant lesions HEAD: Normocephalic, No masses, lesions, tenderness or abnormalities EYES: normal, Conjunctiva are pink and non-injected EARS: External ears normal OROPHARYNX:lips, buccal mucosa, and tongue normal and mucous membranes are moist  NECK: supple, trachea midline LYMPH:  no palpable lymphadenopathy, not examined BREAST:not examined LUNGS: clear to auscultation and percussion HEART: regular rate & rhythm, no murmurs, no gallops, S1 normal and S2 normal ABDOMEN:abdomen soft, non-tender, obese and normal bowel sounds BACK: Back symmetric, no curvature. EXTREMITIES:less then 2 second capillary refill, no joint deformities, effusion, or inflammation, no edema, no skin discoloration, no clubbing, no cyanosis  NEURO: alert & oriented x 3 with fluent speech, no focal motor/sensory deficits, gait normal   LABORATORY DATA: CBC    Component Value Date/Time   WBC 6.6 04/30/2012 1033   RBC 4.63 04/30/2012 1033   HGB 12.7 04/30/2012 1033   HCT 39.1 04/30/2012 1033   PLT 217 04/30/2012 1033   MCV 84.4 04/30/2012 1033   MCH 27.4 04/30/2012 1033   MCHC 32.5 04/30/2012 1033  RDW 14.2 04/30/2012 1033   LYMPHSABS 2.1 04/30/2012 1033   MONOABS 0.3 04/30/2012 1033   EOSABS 0.1 04/30/2012 1033   BASOSABS 0.0 04/30/2012 1033      Chemistry      Component Value Date/Time   NA 139 04/30/2012 1033   K 3.7 04/30/2012 1033   CL 104 04/30/2012 1033   CO2 27 04/30/2012 1033   BUN 14 04/30/2012 1033     CREATININE 0.70 04/30/2012 1033      Component Value Date/Time   CALCIUM 9.8 04/30/2012 1033   ALKPHOS 60 04/30/2012 1033   AST 17 04/30/2012 1033   ALT 13 04/30/2012 1033   BILITOT 0.2* 04/30/2012 1033     Results for Morgan Nielsen, Morgan Nielsen (MRN [id number]) as of 05/07/2012 10:33  Ref. Range 10/18/2011 10:26 11/08/2011 10:54 04/30/2012 10:33  Protein C Activity Latest Range: 75-133 % 134 (H) 162 (H) 175 (H)  Protein C, Total Latest Range: 72-160 % 68 (L) 135 93  Protein S Activity Latest Range: 69-129 % 58 (L) 70 89  Protein S Ag, Total Latest Range: 60-150 % 71 109 83   Results for Morgan Nielsen, Morgan Nielsen (MRN [id number]) as of 05/07/2012 10:33  Ref. Range 10/18/2011 10:26  Anticardiolipin IgA Latest Range: <22 APL U/mL 2 (L)  Anticardiolipin IgG Latest Range: <23 GPL U/mL 2 (L)  Anticardiolipin IgM Latest Range: <11 MPL U/mL 5 (L)  PTT Lupus Anticoagulant Latest Range: 28.0-43.0 secs 36.2  PTTLA Confirmation Latest Range: <8.0 secs NOT APPL  PTTLA 4:1 Mix Latest Range: 28.0-43.0 secs NOT APPL  DRVVT Latest Range: 34.1-42.2 secs 35.2  Drvvt confirmation Latest Range: <1.16 Ratio NOT APPL  dRVVT Incubated 1:1 Mix Latest Range: 34.1-42.2 secs NOT APPL  Lupus Anticoagulant Latest Range: NOT DETECTED  NOT DETECTED  Beta-2 Glyco I IgG Latest Range: <20 G Units 0  Beta-2-Glycoprotein I IgA Latest Range: <20 A Units 0  Beta-2-Glycoprotein I IgM Latest Range: <20 M Units 3  AntiThromb III Func Latest Range: 75-120 % 91  Recommendations-F5LEID: No range found (NOTE)  Recommendations-PTGENE: No range found (NOTE)  Protein C Activity Latest Range: 75-133 % 134 (H)  Protein C, Total Latest Range: 72-160 % 68 (L)  Protein S Activity Latest Range: 69-129 % 58 (L)  Protein S Ag, Total Latest Range: 60-150 % 71      RADIOGRAPHIC STUDIES:  09/20/2011  *RADIOLOGY REPORT*  Clinical Data: Hypothyroidism  THYROID ULTRASOUND  Technique: Ultrasound examination of the thyroid gland and adjacent  soft tissues  was performed.  Comparison: None  Findings:  Right thyroid lobe: 4.4 x 1.1 x 1.6 cm  Left thyroid lobe: 4.6 x 1.2 x 1.1 cm  Isthmus: 3 mm thick, normal.  Focal nodules: Homogeneous parenchymal echogenicity within both  lobes. No discrete thyroid nodule, cyst, mass or calcification.  Lymphadenopathy: None identified  IMPRESSION:  Normal thyroid ultrasound.  Original Report Authenticated By: Lollie Marrow, M.D.    05/18/2011  *RADIOLOGY REPORT*  Clinical Data: Shortness of breath with abdominal pain and anemia.  History of pulmonary emboli with a clotting disorder.  CT ANGIOGRAPHY CHEST WITH CONTRAST  Technique: Multidetector CT imaging of the chest was performed  using the standard protocol during bolus administration of  intravenous contrast. Multiplanar CT image reconstructions  including MIPs were obtained to evaluate the vascular anatomy.  Contrast: 120 ml Omnipaque 3 feet date.  Comparison: CT chest 02/02/2011.  Findings: No pulmonary embolus. Heart size normal. No pericardial  effusion. No pathologically enlarged mediastinal, hilar or  axillary lymph nodes. No pleural fluid.  Mild patchy ground-glass seen predominantly in both lower lobes  shows interval clearing on the the abdomen pelvis portion of the  exam. No pleural fluid. Airway is unremarkable.  Review of the MIP images confirm see above findings.  IMPRESSION:  No pulmonary embolus.  CT ABDOMEN AND PELVIS WITH CONTRAST  Technique: Multidetector CT imaging of the abdomen and pelvis was  performed using the standard protocol following bolus  administration of intravenous contrast.  Findings: Liver, gallbladder, adrenal glands, kidneys, spleen,  pancreas, stomach and small bowel are unremarkable. A fair amount  of stool is seen in the colon. Scattered lymph nodes are not  enlarged by CT size criteria. Uterus and ovaries are visualized.  No free fluid. No worrisome lytic or sclerotic lesions.  IMPRESSION:  1. No  acute findings.  2. Suspect constipation.  Original Report Authenticated By: Reyes Ivan, M.D.   02/02/2011  *RADIOLOGY REPORT*  Clinical Data: Right side chest pain. Cough. Elevated D-dimer.  CT ANGIOGRAPHY CHEST WITH CONTRAST  Technique: Multidetector CT imaging of the chest was performed  using the standard protocol during bolus administration of  intravenous contrast. Multiplanar CT image reconstructions  including MIPs were obtained to evaluate the vascular anatomy.  Contrast: 100 ml Omnipaque-300.  Comparison: Plain film of the chest earlier this same date.  Findings: Study is positive for pulmonary embolus with clot  identified in both main pulmonary arteries. Large volume of clot  is seen in both descending interlobar arteries, greater on the  right. There is cardiomegaly. No axillary, hilar or mediastinal  lymphadenopathy. Small bilateral pleural effusions noted. No  pericardial effusion. Lungs demonstrate extensive airspace opacity  in the right lower lobe which could be due to infarct, pneumonia or  possibly aspiration. Mild dependent atelectasis is seen in the  left lung base.  Incidentally imaged upper abdomen is unremarkable. No focal bony  abnormality.  Review of the MIP images confirms the above findings.  IMPRESSION:  1. Extensive bilateral pulmonary embolus, greater on the right.  2. Peripheral right lower lobe airspace disease is most worrisome  for infarct given high clot burden on the right. Aspiration and  pneumonia are within the differential.  3. Critical test results telephoned to Dr. Bebe Shaggy at the time of  interpretation on 02/02/2011 at 6:40 p.m.  Original Report Authenticated By: [id number]     ASSESSMENT:  1. PE in setting of birth control, namely NuvaRing, diagnosed on CT angio in April 2012 when she presented with severe SOB and pleuritic pain. S/P 6 months of Coumadin anticoagulation with CT angio of chest revealing resolution of PE in  05/2011. This is likely secondary to birth control medication  2. Jehovah's Witness  3. Obesity  4. Family history of blood clots/dyscrasias on mother's side of family. Mother had PE while on birth control and 5 out of 6 of mother's sibling's had DVT or PE, even one brother.  5. Hypothyroidism    PLAN:  1. I personally reviewed and went over laboratory results with the patient. 2. The patient is free to start a family from our standpoint if she so desires.  3. Would not anticoagulate prophylactically at this time.  4. Patient should follow-up with OB/GYN closely if she were to become pregnant.  5. Patient should follow-up with PCP. 6. Release from clinic today.  We would be happy to see the patient in the future if needed.    All questions were answered. The patient knows to  call the clinic with any problems, questions or concerns. We can certainly see the patient much sooner if necessary.  The patient and plan discussed with Glenford Peers, MD and he is in agreement with the aforementioned.  Lorimer Tiberio

## 2013-01-16 ENCOUNTER — Emergency Department: Payer: Self-pay | Admitting: Emergency Medicine

## 2013-02-21 ENCOUNTER — Encounter: Payer: Self-pay | Admitting: *Deleted

## 2013-02-22 ENCOUNTER — Encounter: Payer: Self-pay | Admitting: Nurse Practitioner

## 2013-02-22 ENCOUNTER — Ambulatory Visit (INDEPENDENT_AMBULATORY_CARE_PROVIDER_SITE_OTHER): Payer: BC Managed Care – PPO | Admitting: Nurse Practitioner

## 2013-02-22 VITALS — BP 120/80 | Ht 69.25 in | Wt 282.0 lb

## 2013-02-22 DIAGNOSIS — Z0000 Encounter for general adult medical examination without abnormal findings: Secondary | ICD-10-CM

## 2013-02-22 DIAGNOSIS — Z01419 Encounter for gynecological examination (general) (routine) without abnormal findings: Secondary | ICD-10-CM

## 2013-02-22 LAB — HEMOGLOBIN, FINGERSTICK: Hemoglobin, fingerstick: 13.2 g/dL (ref 12.0–16.0)

## 2013-02-22 NOTE — Progress Notes (Signed)
27 y.o. G0P0 Married African American Fe here for annual exam.  No method including rhythm for birth control since February 2013.  Since going off Nuva Ring menses have been irregular 35 days to 93 days.  PCP sent her to endocrinologist  For further treatment of hypothyroid since not responding well to Synthroid with continued high TSH levels.  Current Synthroid dose at 100 mcg.   She was then diagnosed also as having PCOS.  To have fasting labs next week.  Maybe starting on medication therapy. No FMH of PCOS and denies hirsutism.   Normally  Menses are  5-7 days, some cramps, some PMS.  They have been married for 6 years and are now considering pregnancy. If occurs now that is OK.  Patient's last menstrual period was 02/07/2013.          Sexually active: yes  The current method of family planning is none.  History of PE on Nuva Ring 02/02/11  Exercising: yes  strength training Smoker:  no  Health Maintenance: Pap:  02-21-12 neg TDaP:  7/08 Labs: Hgb- 13.2 Self breast exam: not done   reports that she has never smoked. She has never used smokeless tobacco. She reports that she drinks about 0.6 ounces of alcohol per week. She reports that she does not use illicit drugs.  Past Medical History  Diagnosis Date  . Allergy   . Anemia   . Asthma   . Clotting disorder   . Thyroid disease   . Pulmonary embolism 05/09/2011  . Religious or spiritual beliefs affecting medical care 09/13/2011  . Hypothyroidism 09/13/2011  . Obese 09/13/2011  . Family history of blood clots 09/13/2011  . Family history of blood disorder 09/13/2011  . 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 Wrist surgery      left torn cartilage 2007    Current Outpatient Prescriptions  Medication Sig Dispense Refill  . acetaminophen (TYLENOL) 500 MG tablet Take 500 mg by mouth every 6 (six) hours as needed.        Marland Kitchen aspirin 81 MG tablet Take 81 mg by mouth daily.      Marland Kitchen b complex  vitamins tablet Take 1 tablet by mouth daily.      . cetirizine (ZYRTEC) 10 MG tablet Take 10 mg by mouth at bedtime.        . diphenhydrAMINE (BENADRYL) 25 MG tablet Take 25 mg by mouth at bedtime.        . fish oil-omega-3 fatty acids 1000 MG capsule Take 1,200 mg by mouth daily.      . Iron 66 MG TABS Take 65 mg by mouth daily. OTC Equivalent to FeSo4 325mg        . levothyroxine (SYNTHROID, LEVOTHROID) 88 MCG tablet Take 88 mcg by mouth daily.        . Melatonin 1 MG TABS Take 1 mg by mouth at bedtime as needed.        . multivitamin (THERAGRAN) per tablet Take 1 tablet by mouth daily.         No current facility-administered medications for this visit.    Family History  Problem Relation Age of Onset  . Hyperlipidemia Mother   . Diabetes Father   . Hyperlipidemia Father   . Cancer Maternal Grandfather     prostate    ROS:  Pertinent items are noted in HPI.  Otherwise, a comprehensive ROS was negative.  Exam:   Ht  5' 9.25" (1.759 m)  Wt 282 lb (127.914 kg)  BMI 41.34 kg/m2  LMP 02/07/2013 Height: 5' 9.25" (175.9 cm)  Ht Readings from Last 3 Encounters:  02/22/13 5' 9.25" (1.759 m)  11/08/11 5\' 9"  (1.753 m)  09/13/11 5\' 9"  (1.753 m)    General appearance: alert, cooperative and appears stated age Head: Normocephalic, without obvious abnormality, atraumatic Neck: no adenopathy, supple, symmetrical, trachea midline and thyroid normal to inspection and palpation.  She does have a darker discoloration around her neck that the endocrinologist says maybe related to PCOS and / thyroid dysfunction Lungs: clear to auscultation bilaterally Breasts: normal appearance, no masses or tenderness Heart: regular rate and rhythm Abdomen: soft, non-tender; no masses,  no organomegaly Extremities: extremities normal, atraumatic, no cyanosis or edema Skin: Skin color, texture, turgor normal. No rashes or lesions Lymph nodes: Cervical, supraclavicular, and axillary nodes normal. No abnormal  inguinal nodes palpated Neurologic: Grossly normal   Pelvic: External genitalia:  no lesions              Urethra:  normal appearing urethra with no masses, tenderness or lesions              Bartholin's and Skene's: normal                 Vagina: normal appearing vagina with normal color and discharge, no lesions              Cervix: anteverted              Pap taken: no Bimanual Exam:  Uterus:  normal size, contour, position, consistency, mobility, non-tender exam is limited  because of body habitus              Adnexa: no mass, fullness, tenderness               Rectovaginal: Confirms               Anus:  normal sphincter tone, no lesions  A:  Well Woman with normal exam  History of PE on Nuva Ring  No current method of Birth Control Ok if pregnancy  Hypothyroid with current elevated TSH  Irregular menses and most likely has PCOS  Now seeing Endocrinologist  P:   Pap smear as per guidelines  After Endocrinologist sees patients labs -he may want a PUS to evaluate ovaries - patient will let us know  After TSH is normal and still irregular cycles to let us know  If desires pregnancy and unable to conceive may be a candidate for Clomid  Advised to get prenatal MVI and start taking   counseled on adequate intake of calcium and vitamin D, diet and exercise  return annually or prn  An After Visit Summary was printed and given to the patient.

## 2013-02-22 NOTE — Patient Instructions (Addendum)
General topics  Next pap or exam is  due in 1 year Take a Women's multivitamin Take 1200 mg. of calcium daily - prefer dietary If any concerns in interim to call back  Breast Self-Awareness Practicing breast self-awareness may pick up problems early, prevent significant medical complications, and possibly save your life. By practicing breast self-awareness, you can become familiar with how your breasts look and feel and if your breasts are changing. This allows you to notice changes early. It can also offer you some reassurance that your breast health is good. One way to learn what is normal for your breasts and whether your breasts are changing is to do a breast self-exam. If you find a lump or something that was not present in the past, it is best to contact your caregiver right away. Other findings that should be evaluated by your caregiver include nipple discharge, especially if it is bloody; skin changes or reddening; areas where the skin seems to be pulled in (retracted); or new lumps and bumps. Breast pain is seldom associated with cancer (malignancy), but should also be evaluated by a caregiver. BREAST SELF-EXAM The best time to examine your breasts is 5 7 days after your menstrual period is over.  ExitCare Patient Information 2013 [employer].   Exercise to Stay Healthy Exercise helps you become and stay healthy. EXERCISE IDEAS AND TIPS Choose exercises that:  You enjoy.  Fit into your day. You do not need to exercise really hard to be healthy. You can do exercises at a slow or medium level and stay healthy. You can:  Stretch before and after working out.  Try yoga, Pilates, or tai chi.  Lift weights.  Walk fast, swim, jog, run, climb stairs, bicycle, dance, or rollerskate.  Take aerobic classes. Exercises that burn about 150 calories:  Running 1  miles in 15 minutes.  Playing volleyball for 45 to 60 minutes.  Washing and waxing a car for 45 to 60  minutes.  Playing touch football for 45 minutes.  Walking 1  miles in 35 minutes.  Pushing a stroller 1  miles in 30 minutes.  Playing basketball for 30 minutes.  Raking leaves for 30 minutes.  Bicycling 5 miles in 30 minutes.  Walking 2 miles in 30 minutes.  Dancing for 30 minutes.  Shoveling snow for 15 minutes.  Swimming laps for 20 minutes.  Walking up stairs for 15 minutes.  Bicycling 4 miles in 15 minutes.  Gardening for 30 to 45 minutes.  Jumping rope for 15 minutes.  Washing windows or floors for 45 to 60 minutes. Document Released: 10/29/2010 Document Revised: 12/19/2011 Document Reviewed: 10/29/2010 ExitCare Patient Information 2013 [employer].   Other topics ( that may be useful information):    Sexually Transmitted Disease Sexually transmitted disease (STD) refers to any infection that is passed from person to person during sexual activity. This may happen by way of saliva, semen, blood, vaginal mucus, or urine. Common STDs include:  Gonorrhea.  Chlamydia.  Syphilis.  HIV/AIDS.  Genital herpes.  Hepatitis B and C.  Trichomonas.  Human papillomavirus (HPV).  Pubic lice. CAUSES  An STD may be spread by bacteria, virus, or parasite. A person can get an STD by:  Sexual intercourse with an infected person.  Sharing sex toys with an infected person.  Sharing needles with an infected person.  Having intimate contact with the genitals, mouth, or rectal areas of an infected person. SYMPTOMS  Some people may not have any symptoms, but   they can still pass the infection to others. Different STDs have different symptoms. Symptoms include:  Painful or bloody urination.  Pain in the pelvis, abdomen, vagina, anus, throat, or eyes.  Skin rash, itching, irritation, growths, or sores (lesions). These usually occur in the genital or anal area.  Abnormal vaginal discharge.  Penile discharge in men.  Soft, flesh-colored skin growths in the  genital or anal area.  Fever.  Pain or bleeding during sexual intercourse.  Swollen glands in the groin area.  Yellow skin and eyes (jaundice). This is seen with hepatitis. DIAGNOSIS  To make a diagnosis, your caregiver may:  Take a medical history.  Perform a physical exam.  Take a specimen (culture) to be examined.  Examine a sample of discharge under a microscope.  Perform blood test TREATMENT   Chlamydia, gonorrhea, trichomonas, and syphilis can be cured with antibiotic medicine.  Genital herpes, hepatitis, and HIV can be treated, but not cured, with prescribed medicines. The medicines will lessen the symptoms.  Genital warts from HPV can be treated with medicine or by freezing, burning (electrocautery), or surgery. Warts may come back.  HPV is a virus and cannot be cured with medicine or surgery.However, abnormal areas may be followed very closely by your caregiver and may be removed from the cervix, vagina, or vulva through office procedures or surgery. If your diagnosis is confirmed, your recent sexual partners need treatment. This is true even if they are symptom-free or have a negative culture or evaluation. They should not have sex until their caregiver says it is okay. HOME CARE INSTRUCTIONS  All sexual partners should be informed, tested, and treated for all STDs.  Take your antibiotics as directed. Finish them even if you start to feel better.  Only take over-the-counter or prescription medicines for pain, discomfort, or fever as directed by your caregiver.  Rest.  Eat a balanced diet and drink enough fluids to keep your urine clear or pale yellow.  Do not have sex until treatment is completed and you have followed up with your caregiver. STDs should be checked after treatment.  Keep all follow-up appointments, Pap tests, and blood tests as directed by your caregiver.  Only use latex condoms and water-soluble lubricants during sexual activity. Do not use  petroleum jelly or oils.  Avoid alcohol and illegal drugs.  Get vaccinated for HPV and hepatitis. If you have not received these vaccines in the past, talk to your caregiver about whether one or both might be right for you.  Avoid risky sex practices that can break the skin. The only way to avoid getting an STD is to avoid all sexual activity.Latex condoms and dental dams (for oral sex) will help lessen the risk of getting an STD, but will not completely eliminate the risk. SEEK MEDICAL CARE IF:   You have a fever.  You have any new or worsening symptoms. Document Released: 12/17/2002 Document Revised: 12/19/2011 Document Reviewed: 12/24/2010 ExitCare Patient Information 2013 [employer]Care[employer].    Domestic Abuse You are being battered or abused if someone close to you hits, pushes, or physically hurts you in any way. You also are being abused if you are forced into activities. You are being sexually abused if you are forced to have sexual contact of any kind. You are being emotionally abused if you are made to feel worthless or if you are constantly threatened. It is important to remember that help is available. No one has the right to abuse you. PREVENTION OF FURTHER   ABUSE  Learn the warning signs of danger. This varies with situations but may include: the use of alcohol, threats, isolation from friends and family, or forced sexual contact. Leave if you feel that violence is going to occur.  If you are attacked or beaten, report it to the police so the abuse is documented. You do not have to press charges. The police can protect you while you or the attackers are leaving. Get the officer's name and badge number and a copy of the report.  Find someone you can trust and tell them what is happening to you: your caregiver, a nurse, clergy member, close friend or family member. Feeling ashamed is natural, but remember that you have done nothing wrong. No one deserves abuse. Document Released:  09/23/2000 Document Revised: 12/19/2011 Document Reviewed: 12/02/2010 ExitCare Patient Information 2013 [employer].    How Much is Too Much Alcohol? Drinking too much alcohol can cause injury, accidents, and health problems. These types of problems can include:   Car crashes.  Falls.  Family fighting (domestic violence).  Drowning.  Fights.  Injuries.  Burns.  Damage to certain organs.  Having a baby with birth defects. ONE DRINK CAN BE TOO MUCH WHEN YOU ARE:  Working.  Pregnant or breastfeeding.  Taking medicines. Ask your doctor.  Driving or planning to drive. If you or someone you know has a drinking problem, get help from a doctor.  Document Released: 07/23/2009 Document Revised: 12/19/2011 Document Reviewed: 07/23/2009 ExitCare Patient Information 2013 [employer].   Smoking Hazards Smoking cigarettes is extremely bad for your health. Tobacco smoke has over 200 known poisons in it. There are over 60 chemicals in tobacco smoke that cause cancer. Some of the chemicals found in cigarette smoke include:   Cyanide.  Benzene.  Formaldehyde.  Methanol (wood alcohol).  Acetylene (fuel used in welding torches).  Ammonia. Cigarette smoke also contains the poisonous gases nitrogen oxide and carbon monoxide.  Cigarette smokers have an increased risk of many serious medical problems and Smoking causes approximately:  90% of all lung cancer deaths in men.  80% of all lung cancer deaths in women.  90% of deaths from chronic obstructive lung disease. Compared with nonsmokers, smoking increases the risk of:  Coronary heart disease by 2 to 4 times.  Stroke by 2 to 4 times.  Men developing lung cancer by 23 times.  Women developing lung cancer by 13 times.  Dying from chronic obstructive lung diseases by 12 times.  . Smoking is the most preventable cause of death and disease in our society.  WHY IS SMOKING ADDICTIVE?  Nicotine is the chemical  agent in tobacco that is capable of causing addiction or dependence.  When you smoke and inhale, nicotine is absorbed rapidly into the bloodstream through your lungs. Nicotine absorbed through the lungs is capable of creating a powerful addiction. Both inhaled and non-inhaled nicotine may be addictive.  Addiction studies of cigarettes and spit tobacco show that addiction to nicotine occurs mainly during the teen years, when young people begin using tobacco products. WHAT ARE THE BENEFITS OF QUITTING?  There are many health benefits to quitting smoking.   Likelihood of developing cancer and heart disease decreases. Health improvements are seen almost immediately.  Blood pressure, pulse rate, and breathing patterns start returning to normal soon after quitting. QUITTING SMOKING   American Lung Association - 1-800-LUNGUSA  American Cancer Society - 1-800-ACS-2345 Document Released: 11/03/2004 Document Revised: 12/19/2011 Document Reviewed: 07/08/2009 ExitCare Patient Information 2013 ExitCare,   LLC.   Stress Management Stress is a state of physical or mental tension that often results from changes in your life or normal routine. Some common causes of stress are:  Death of a loved one.  Injuries or severe illnesses.  Getting fired or changing jobs.  Moving into a new home. Other causes may be:  Sexual problems.  Business or financial losses.  Taking on a large debt.  Regular conflict with someone at home or at work.  Constant tiredness from lack of sleep. It is not just bad things that are stressful. It may be stressful to:  Win the lottery.  Get married.  Buy a new car. The amount of stress that can be easily tolerated varies from person to person. Changes generally cause stress, regardless of the types of change. Too much stress can affect your health. It may lead to physical or emotional problems. Too little stress (boredom) may also become stressful. SUGGESTIONS TO  REDUCE STRESS:  Talk things over with your family and friends. It often is helpful to share your concerns and worries. If you feel your problem is serious, you may want to get help from a professional counselor.  Consider your problems one at a time instead of lumping them all together. Trying to take care of everything at once may seem impossible. List all the things you need to do and then start with the most important one. Set a goal to accomplish 2 or 3 things each day. If you expect to do too many in a single day you will naturally fail, causing you to feel even more stressed.  Do not use alcohol or drugs to relieve stress. Although you may feel better for a short time, they do not remove the problems that caused the stress. They can also be habit forming.  Exercise regularly - at least 3 times per week. Physical exercise can help to relieve that "uptight" feeling and will relax you.  The shortest distance between despair and hope is often a good night's sleep.  Go to bed and get up on time allowing yourself time for appointments without being rushed.  Take a short "time-out" period from any stressful situation that occurs during the day. Close your eyes and take some deep breaths. Starting with the muscles in your face, tense them, hold it for a few seconds, then relax. Repeat this with the muscles in your neck, shoulders, hand, stomach, back and legs.  Take good care of yourself. Eat a balanced diet and get plenty of rest.  Schedule time for having fun. Take a break from your daily routine to relax. HOME CARE INSTRUCTIONS   Call if you feel overwhelmed by your problems and feel you can no longer manage them on your own.  Return immediately if you feel like hurting yourself or someone else. Document Released: 03/22/2001 Document Revised: 12/19/2011 Document Reviewed: 11/12/2007 ExitCare Patient Information 2013 [employer]Care[employer].   

## 2013-02-27 NOTE — Progress Notes (Signed)
Encounter reviewed.  I recommend MFM consultation to discuss thromboembolic history and how this affects pregnancy.  Conley Simmonds, M.D.

## 2013-02-28 ENCOUNTER — Telehealth: Payer: Self-pay | Admitting: Nurse Practitioner

## 2013-02-28 DIAGNOSIS — I2699 Other pulmonary embolism without acute cor pulmonale: Secondary | ICD-10-CM

## 2013-02-28 NOTE — Telephone Encounter (Signed)
Patient has been notified that we do need to refer her to Maternal fetal medicine at [facility] with her history of PE on birth control before she gets pregnant in case she needs anticoagulations treatment.

## 2013-03-07 ENCOUNTER — Telehealth: Payer: Self-pay | Admitting: *Deleted

## 2013-03-07 NOTE — Telephone Encounter (Signed)
Left message on pateint CB# of need to call office for appointment date at [facility] Maternal Fetal Medicine for June 3rd @ 10;00am. sue

## 2013-03-07 NOTE — Telephone Encounter (Signed)
LEFT MESSAGE TO CALL OFFICE FOR APPOINTMENT DATE AT MATERNAL FETAL MEDICINE @ [facility].

## 2013-03-08 ENCOUNTER — Telehealth: Payer: Self-pay | Admitting: *Deleted

## 2013-03-08 NOTE — Telephone Encounter (Signed)
PATIENT CALLED AND NOTIFIED OF APPT. @ [facility] MATERNAL RETAL MEDICAINE FOR June 3RD,2014 @ 10:00am Morgan Nielsen

## 2013-03-12 ENCOUNTER — Ambulatory Visit (HOSPITAL_COMMUNITY)
Admission: RE | Admit: 2013-03-12 | Discharge: 2013-03-12 | Disposition: A | Discharge status: Home / Self Care | Payer: BC Managed Care – PPO | Source: Ambulatory Visit | Attending: Obstetrics and Gynecology | Admitting: Obstetrics and Gynecology

## 2013-03-12 DIAGNOSIS — Z86711 Personal history of pulmonary embolism: Secondary | ICD-10-CM | POA: Insufficient documentation

## 2013-03-12 DIAGNOSIS — Z3169 Encounter for other general counseling and advice on procreation: Secondary | ICD-10-CM | POA: Insufficient documentation

## 2013-03-12 DIAGNOSIS — E039 Hypothyroidism, unspecified: Secondary | ICD-10-CM | POA: Insufficient documentation

## 2013-03-12 DIAGNOSIS — D6859 Other primary thrombophilia: Secondary | ICD-10-CM | POA: Insufficient documentation

## 2013-03-12 DIAGNOSIS — E669 Obesity, unspecified: Secondary | ICD-10-CM

## 2013-03-12 NOTE — Consult Note (Signed)
MFM consult   HPI: 27 yr old G0 with history of pulmonary embolism on nuvaring presents for preconception counseling.  PMH: pulmonary emoblism 2012; on nuvaring; hypothyroidism  PSH: wrist surgery  Past Ob hx: none  Past Gyn hx: irregular menses  Social history: - tobacco, - illicit drug use; occasional alcohol  Meds: MVI; fish oil, low dose aspirin, synthroid, zyrtec  Allergies: shellfish  Family history: multiple family members on her mother's side with thromboembolic events; no diagnosis of thrombophilia   I counseled the patient as follows:  1. History of pulmonary emoblism: Patient has been treated by Hematology. She has tested negative for inherited and acquired thrombophilias. It is thought that the risk factor was hormonal contraceptives- patient was on nuvaring at the time.  Discussed increased risk of thromboembolic event during pregnancy as patient has a history and pregnancy is a prothrombotic state.  Would recommend the patient be started on prophylactic anticoagulation with lovenox 40mg  daily with a positive pregnancy test. Discussed increased risks of bleeding although risk of significant bleeding episode is low <3%. Discussed coumadin is contraindicated in pregnancy. Lovenox or heparin are the preferred choice and are safe to use in pregnancy.  Would recommend check anti-Xa level after 3 doses to ensure proper prophylactic dose- anti-Xa should be maintained at 0.3-0.5. May need to adjust dose throughout pregnancy.  Discussed can stay on lovenox and set up induction for 39 weeks; discontinue lovenox 24 hours prior to induction. Would recommend SCDs during labor and restart lovenox 6 hours after vaginal delivery or 12 hours after C section and continue for 6-8 weeks postpartum.  Alternatively can switch to heparin at 36 weeks and await spontaneous labor.  Would recommend continue prophylactic anticoagulation until 6-8 weeks postpartum as this is a high risk period as  well.  Would monitor closely for signs/symptoms of thromboembolism.  Discussed options for contraception are limited given she had a pulmonary embolism on hormonal contraceptives. Would not recommend oral contraceptives, or systemic hormonal contraceptives. Discussed IUD may be a good choice for her. Discussed mirena has progesterone but I feel risk of thromboembolic event would be low given minimal systemic hormonal effect. Alternatively paragard is hormone free and would be safe.  2. Hypothyroidism: I discussed that poorly controlled hypothyroidism is associated with the following risks in pregnancy: preeclampsia, abruption, preterm delivery, fetal growth restriction, and in severe cases neonatal neuropsychological and cognitive impairment Discussed that well controlled hypothyroidism is usually associated with good pregnancy outcomes. Would recommend increasing synthroid dose by 2 doses per week with positive pregnancy test and check TSH in 4 weeks. Follow TSH at least once a trimester during pregnancy and adjust synthroid as needed to keep TSH <2.5. Patient is followed by Endocrinology  3. Obesity: - discussed that losing weight prior to pregnancy could improve fertility and pregnancy outcomes - would recommend referral to a nutritionist - discussed the following increased risks associated with obesity in pregnancy: macrosomia, gestational diabetes, fetal growth restriction, preeclampsia, stillbirth, C section, wound complications, and venous thromboembolism - patient is considering bariatric surgery- discussed if she proceeds with this would recommend delaying pregnancy for 12-18 months after surgery and wait until she is at goal weight and ensure nutritional status is appropriate - recommend following fetal growth in pregnancy  I spent 30 minutes in face to face consultation with the patient discussing the above.  Please call with questions.  Eulis Foster, MD

## 2013-03-12 NOTE — ED Notes (Signed)
BP 139/88, P-77, 283.5lb

## 2013-03-13 NOTE — Addendum Note (Signed)
Encounter addended by: Ty Hilts, RN on: 03/13/2013  8:40 AM<BR>     Documentation filed: Charges VN

## 2013-04-16 ENCOUNTER — Ambulatory Visit: Payer: Self-pay | Admitting: Specialist

## 2013-04-16 LAB — PROTIME-INR
INR: 1
Prothrombin Time: 13.1 secs (ref 11.5–14.7)

## 2013-04-16 LAB — COMPREHENSIVE METABOLIC PANEL
Albumin: 3.2 g/dL — ABNORMAL LOW (ref 3.4–5.0)
Alkaline Phosphatase: 83 U/L (ref 50–136)
Anion Gap: 9 (ref 7–16)
BUN: 10 mg/dL (ref 7–18)
Bilirubin,Total: 0.2 mg/dL (ref 0.2–1.0)
Calcium, Total: 9 mg/dL (ref 8.5–10.1)
Chloride: 106 mmol/L (ref 98–107)
Co2: 22 mmol/L (ref 21–32)
Creatinine: 0.71 mg/dL (ref 0.60–1.30)
EGFR (African American): >60
EGFR (Non-African Amer.): >60
Glucose: 96 mg/dL (ref 65–99)
Osmolality: 273 (ref 275–301)
Potassium: 3.7 mmol/L (ref 3.5–5.1)
SGOT(AST): 9 U/L — ABNORMAL LOW (ref 15–37)
SGPT (ALT): 22 U/L (ref 12–78)
Sodium: 137 mmol/L (ref 136–145)
Total Protein: 7.4 g/dL (ref 6.4–8.2)

## 2013-04-16 LAB — HEPATIC FUNCTION PANEL A (ARMC): Bilirubin, Direct: <0.1 mg/dL (ref 0.00–0.20)

## 2013-04-16 LAB — CBC WITH DIFFERENTIAL/PLATELET
Basophil #: 0.1 10*3/uL (ref 0.0–0.1)
Basophil %: 0.7 %
Eosinophil #: 0.1 10*3/uL (ref 0.0–0.7)
Eosinophil %: 1 %
HCT: 31.7 % — ABNORMAL LOW (ref 35.0–47.0)
HGB: 10.5 g/dL — ABNORMAL LOW (ref 12.0–16.0)
Lymphocyte #: 2.5 10*3/uL (ref 1.0–3.6)
Lymphocyte %: 25 %
MCH: 26.7 pg (ref 26.0–34.0)
MCHC: 33 g/dL (ref 32.0–36.0)
MCV: 81 fL (ref 80–100)
Monocyte #: 0.5 x10 3/mm (ref 0.2–0.9)
Monocyte %: 4.6 %
Neutrophil #: 6.8 10*3/uL — ABNORMAL HIGH (ref 1.4–6.5)
Neutrophil %: 68.7 %
Platelet: 233 10*3/uL (ref 150–440)
RBC: 3.92 10*6/uL (ref 3.80–5.20)
RDW: 15.8 % — ABNORMAL HIGH (ref 11.5–14.5)
WBC: 9.8 10*3/uL (ref 3.6–11.0)

## 2013-04-16 LAB — APTT: Activated PTT: 28.1 secs (ref 23.6–35.9)

## 2013-04-16 LAB — IRON AND TIBC
Iron Bind.Cap.(Total): 300 ug/dL (ref 250–450)
Iron Saturation: 11 %
Iron: 33 ug/dL — ABNORMAL LOW (ref 50–170)
Unbound Iron-Bind.Cap.: 267 ug/dL

## 2013-04-16 LAB — AMYLASE: Amylase: 45 U/L (ref 25–115)

## 2013-04-16 LAB — HEMOGLOBIN A1C: Hemoglobin A1C: 6.1 % (ref 4.2–6.3)

## 2013-04-16 LAB — PHOSPHORUS: Phosphorus: 3.7 mg/dL (ref 2.5–4.9)

## 2013-04-16 LAB — FERRITIN: Ferritin (ARMC): 23 ng/mL (ref 8–388)

## 2013-04-16 LAB — MAGNESIUM: Magnesium: 1.8 mg/dL

## 2013-04-16 LAB — TSH: Thyroid Stimulating Horm: 3.46 u[IU]/mL

## 2013-04-16 LAB — LIPASE, BLOOD: Lipase: 123 U/L (ref 73–393)

## 2013-04-16 LAB — FOLATE: Folic Acid: 17.2 ng/mL (ref 3.1–100.0)

## 2013-05-09 ENCOUNTER — Telehealth: Payer: Self-pay | Admitting: Nurse Practitioner

## 2013-05-09 NOTE — Telephone Encounter (Signed)
Patient was seen at [facility]. Found out that she is pregnant and has been told in the past that she needs to take Lovenox  If she was to ever become pregnant. Due to history of PE.  Please contact Dwyane Luo . [phone]

## 2013-05-10 NOTE — Telephone Encounter (Signed)
Please see incoming phone note below. Patient LOV with P.Grubb,FNP See note / letter from [facility]  On visit 03/12/2013 Please advise.

## 2013-05-10 NOTE — Telephone Encounter (Signed)
Hi Morgan Nielsen,  We discussed having the PCP start the patient on Lovenox and refer her to an Obstetric office of choice.    If they are not comfortable starting the Lovenox, she can be seen by her Obstetric office next week and begin it then.  [employer]

## 2013-05-10 NOTE — Telephone Encounter (Signed)
Spoke with Dr. Dwyane Luo. Informed of response from Dr. Edward Jolly of patient need for Lovenox Tx. Dr. Loreta Ave appreciated the consult information and will notify patient of this.

## 2013-05-21 LAB — OB RESULTS CONSOLE RPR: RPR: NONREACTIVE

## 2013-05-21 LAB — OB RESULTS CONSOLE RUBELLA ANTIBODY, IGM: Rubella: NON-IMMUNE/NOT IMMUNE

## 2013-05-21 LAB — OB RESULTS CONSOLE ABO/RH: RH Type: NEGATIVE

## 2013-05-21 LAB — OB RESULTS CONSOLE HEPATITIS B SURFACE ANTIGEN: Hepatitis B Surface Ag: NEGATIVE

## 2013-05-21 LAB — OB RESULTS CONSOLE HIV ANTIBODY (ROUTINE TESTING): HIV: NONREACTIVE

## 2013-05-21 LAB — OB RESULTS CONSOLE GC/CHLAMYDIA
Chlamydia: NEGATIVE
Gonorrhea: NEGATIVE

## 2013-05-21 LAB — OB RESULTS CONSOLE ANTIBODY SCREEN: Antibody Screen: NEGATIVE

## 2013-05-28 ENCOUNTER — Ambulatory Visit: Payer: Self-pay | Admitting: Specialist

## 2013-06-10 ENCOUNTER — Ambulatory Visit: Payer: Self-pay | Admitting: Specialist

## 2013-10-02 ENCOUNTER — Other Ambulatory Visit: Payer: Self-pay | Admitting: Obstetrics and Gynecology

## 2013-10-02 DIAGNOSIS — O43109 Malformation of placenta, unspecified, unspecified trimester: Secondary | ICD-10-CM

## 2013-10-16 ENCOUNTER — Other Ambulatory Visit: Payer: Self-pay | Admitting: Obstetrics and Gynecology

## 2013-10-16 ENCOUNTER — Encounter (HOSPITAL_COMMUNITY): Payer: Self-pay

## 2013-10-16 ENCOUNTER — Ambulatory Visit (HOSPITAL_COMMUNITY)
Admission: RE | Admit: 2013-10-16 | Discharge: 2013-10-16 | Disposition: A | Discharge status: Home / Self Care | Payer: BC Managed Care – PPO | Source: Ambulatory Visit | Attending: Obstetrics and Gynecology | Admitting: Obstetrics and Gynecology

## 2013-10-16 DIAGNOSIS — O365990 Maternal care for other known or suspected poor fetal growth, unspecified trimester, not applicable or unspecified: Secondary | ICD-10-CM | POA: Insufficient documentation

## 2013-10-16 DIAGNOSIS — E039 Hypothyroidism, unspecified: Secondary | ICD-10-CM | POA: Insufficient documentation

## 2013-10-16 DIAGNOSIS — O43109 Malformation of placenta, unspecified, unspecified trimester: Secondary | ICD-10-CM

## 2013-10-16 DIAGNOSIS — O99280 Endocrine, nutritional and metabolic diseases complicating pregnancy, unspecified trimester: Secondary | ICD-10-CM

## 2013-10-16 DIAGNOSIS — E079 Disorder of thyroid, unspecified: Secondary | ICD-10-CM | POA: Insufficient documentation

## 2013-10-16 DIAGNOSIS — O43899 Other placental disorders, unspecified trimester: Secondary | ICD-10-CM | POA: Insufficient documentation

## 2013-10-16 DIAGNOSIS — O09299 Supervision of pregnancy with other poor reproductive or obstetric history, unspecified trimester: Secondary | ICD-10-CM | POA: Insufficient documentation

## 2013-11-27 ENCOUNTER — Other Ambulatory Visit: Payer: Self-pay | Admitting: Obstetrics and Gynecology

## 2013-11-28 ENCOUNTER — Other Ambulatory Visit: Payer: Self-pay | Admitting: Obstetrics and Gynecology

## 2013-12-03 ENCOUNTER — Other Ambulatory Visit: Payer: Self-pay | Admitting: Obstetrics and Gynecology

## 2013-12-03 DIAGNOSIS — Z86711 Personal history of pulmonary embolism: Secondary | ICD-10-CM

## 2013-12-06 ENCOUNTER — Other Ambulatory Visit: Payer: Self-pay | Admitting: Obstetrics and Gynecology

## 2013-12-06 ENCOUNTER — Ambulatory Visit (HOSPITAL_COMMUNITY)
Admission: RE | Admit: 2013-12-06 | Discharge: 2013-12-06 | Disposition: A | Discharge status: Home / Self Care | Payer: BC Managed Care – PPO | Source: Ambulatory Visit | Attending: Obstetrics and Gynecology | Admitting: Obstetrics and Gynecology

## 2013-12-06 ENCOUNTER — Encounter (HOSPITAL_COMMUNITY): Payer: Self-pay

## 2013-12-06 DIAGNOSIS — O99284 Endocrine, nutritional and metabolic diseases complicating childbirth: Secondary | ICD-10-CM

## 2013-12-06 DIAGNOSIS — E039 Hypothyroidism, unspecified: Secondary | ICD-10-CM | POA: Diagnosis present

## 2013-12-06 DIAGNOSIS — O139 Gestational [pregnancy-induced] hypertension without significant proteinuria, unspecified trimester: Secondary | ICD-10-CM | POA: Diagnosis present

## 2013-12-06 DIAGNOSIS — K219 Gastro-esophageal reflux disease without esophagitis: Secondary | ICD-10-CM | POA: Diagnosis present

## 2013-12-06 DIAGNOSIS — E669 Obesity, unspecified: Secondary | ICD-10-CM | POA: Diagnosis present

## 2013-12-06 DIAGNOSIS — E079 Disorder of thyroid, unspecified: Secondary | ICD-10-CM | POA: Diagnosis present

## 2013-12-06 DIAGNOSIS — O99214 Obesity complicating childbirth: Secondary | ICD-10-CM

## 2013-12-06 DIAGNOSIS — O360990 Maternal care for other rhesus isoimmunization, unspecified trimester, not applicable or unspecified: Secondary | ICD-10-CM | POA: Diagnosis present

## 2013-12-06 DIAGNOSIS — Z86711 Personal history of pulmonary embolism: Secondary | ICD-10-CM

## 2013-12-06 DIAGNOSIS — Z6841 Body Mass Index (BMI) 40.0 and over, adult: Secondary | ICD-10-CM

## 2013-12-06 DIAGNOSIS — O321XX0 Maternal care for breech presentation, not applicable or unspecified: Principal | ICD-10-CM | POA: Diagnosis present

## 2013-12-06 NOTE — H&P (Addendum)
Morgan CruiseJasmine S Nielsen is a 28 y.o. female, G1P0 at 7538 4/7 weeks, scheduled on 12/07/13 for primary cesarean section due to breech presentation and hx PE, on Lovenox for pregnancy--changed to heparin in last 24 hours pre-op, day of surgery's heparin dose held.  Patient Active Problem List   Diagnosis Date Noted  . Religious or spiritual beliefs affecting medical care 09/13/2011  . Hypothyroidism 09/13/2011  . Obese 09/13/2011  . Family history of blood clots 09/13/2011  . Family history of blood disorder 09/13/2011  . Pulmonary embolism 05/09/2011  BMI 43+ Jehovah's witness, declines blood/blood products Hx PE 2002--on Lovenox during pregnancy, changed to heparin 10,000 u in last 24 hours--LD the am of surgery On Synthroid 112 mcg daily Gestational hypertension--on Labetatol 200 mg po daily Rh negative--received Rhophylac at 30 weeks Thickened placenta noted on US at 29 weeks Lagging growth of BPD--last US showed EFW 16%ile.  History of present pregnancy: Patient entered care at 7 weeks.   EDC of 12/17/13 was established by US at 10 3/7 weeks, due to uncertain LMP.   Anatomy scan:  20 weeks, with limited anatomy and an anterior placenta.   Additional US evaluations:  23 weeks, to complete anatomy:  All WNL 28 weeks:  Thickened placenta noted, with BPD < 2%ile, EFW 42%ile, transverse presentation.  Weekly BPPs implemented. 33 1/7 weeks:  EFW 4+5, 1948 gm, 20%ile, HC and BPD lagging 34 2/7 weeks:  EFW 4+5, 13.7%ile, BPD < 2.3%ile, breech, normal fluid 36 2/7 weeks:  EFW 16%ile, normal fluid, breech Significant prenatal events: Had early US at 13 weeks with dating, due to uncertain LMP.  Received Rhophylac in early pregnancy for bleeding, and again at 32 weeks.  Was on Lovenox from early pregnancy, due to hx PE on Nuvaring.  Switched to Heparin at 36 weeks. Declined genetic testing.  Had normal early glucola and normal testing at 28 weeks.  Had thickened placenta noted at 29 weeks--3 hour GTT done  to completely r/o diabetes.  Lag in head growth noted at 29 weeks, with growth ranging over next several USs between 13 and 20%ile (down from 42%ile).  Fluid remained normal.  Gestational hypertension noted at 34 weeks, with patient placed on Labetalol 200 mg po BID at 36 weeks.  PIH w/u was negative at that time.  Received TDaP 10/01/13 and flu vaccine 07/30/13.  Was followed by endocrinologist during pregnancy for hypothyroidism--maintained on 112 mcg daily throughout pregnancy.  At 36 weeks, when fetus was persistently breech, she elected to schedule a C/S--ECV was not recommended due to anti-coagulant therapy and small HC/BPD. Last evaluation: 11/21/13--Still breech, growth at 16%ile, normal fluid and BPP.  Scheduled for C/S.  OB History   Grav Para Term Preterm Abortions TAB SAB Ect Mult Living   1 0 0 0 0 0 0 0 0 0      Past Medical History  Diagnosis Date  . Allergy   . Anemia   . Asthma   . Thyroid disease   . Pulmonary embolism 05/09/2011  . Religious or spiritual beliefs affecting medical care 09/13/2011    doesnt accept blood  . Hypothyroidism 09/13/2011  . Obese 09/13/2011  . Family history of blood clots 09/13/2011  . Family history of blood disorder 09/13/2011  . 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 Clotting disorder     per bloodwork not clotting disorder  . Hypertension   . Headache(784.0)     migraines   Past Surgical History  Procedure Laterality Date  . Wrist  surgery      left torn cartilage 2007  . Wisdom tooth extraction     Family History: family history includes Cancer in her maternal grandfather and paternal aunt; Deep vein thrombosis in her maternal uncle; Diabetes in her father; Hyperlipidemia in her father and mother; Hypertension in her mother; Pulmonary embolism in her maternal aunt, maternal aunt, maternal aunt, and mother.  Social History:  reports that she has never smoked. She has never used smokeless tobacco. She reports that she drinks about 0.5 ounces of  alcohol per week. She reports that she does not use illicit drugs.  Husband, Ily Denno, is involved and supportive.  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   Prenatal Transfer Tool  Maternal Diabetes: No Genetic Screening: Declined Maternal Ultrasounds/Referrals: Abnormal:  Findings:   Other:  Lag in HC/BPD, growth at 16%ile on last [place] 11/21/13, normal fluid.  Thickened placenta noted on 29 week [place]. Fetal Ultrasounds or other Referrals:  None Maternal Substance Abuse:  No Significant Maternal Medications:  Meds include: Syntroid Other:  112 mcg daily;  Labetalol 200 mg po BID; Lovenox BID during pregnancy, change to Heparin 10,000 daily at 36 weeks due to hx of PE in past.   Significant Maternal Lab Results: Lab values include: Group B Strep negative, Rh negative    ROS:  +FM, occasional contractions.  Allergies  Allergen Reactions  . Shellfish Allergy Swelling       Last menstrual period 04/05/2013.  Chest clear Heart RRR without murmur Abd gravid, NT, FH 38 cm Pelvic:  Deferred Ext: WNL  FHR: 155 on office evaluation UCs:  Occ per patient  Prenatal labs: ABO, Rh:  B- Antibody:  Neg Rubella:   Immune RPR:   NR HBsAg:   Neg HIV:   NR GBS:  Neg Sickle cell/Hgb electrophoresis:  Normal Pap:  02/2013, WNL GC:  Neg 05/21/13 Chlamydia:  05/21/13 Genetic screenings:  Declined Glucola:  Normal early screen, normal at 28 weeks, and normal 3 hour GTT Other:  Normal PIH evaluation at 35 weeks    Assessment/Plan: IUP at 38 4/7 weeks Breech presentation Hx PE--on Lovenox during pregnancy, converted to Heparin at 36 weeks, LD heparin 12/05/13. Gestational hypertension--on Labetalol 200 mg po BID Hypothyroidism--on Synthroid 112 mcg daily Rh negative Morbid obesity Thickened placenta Growth lag  Plan: Admit to [facility] on 12/07/13 for scheduled primary LTCS. Routine [facility] pre-op orders Anticipate restarting Lovenox pp 12 hours after C/S, and continuing  6-8 weeks pp per MFM recommendations. R&B of C/S have been reviewed with the patient in the office and will be reviewed again by Dr. Estanislado Pandy prior to surgery, including risk of bleeding in light of patient as Jehovah's Witness.  Nyra Capes, [place] 12/07/2013, 11:16 AM    Patient was initially scheduled for  C-section on 12/10/13 but call from Dr Otho Perl yesterday with EFW at 10% and all other measurements at 3%. BPP 8/8 and AFI normal. Dopplers upper limit of normal. MFM recommended we proceed with delivery but patient had taken 60 mg Lovenox at 10:00 am that morning. Will proceed with cesarean today > 24 hours after last dose of Lovenox.

## 2013-12-07 ENCOUNTER — Inpatient Hospital Stay (HOSPITAL_COMMUNITY): Payer: BC Managed Care – PPO | Admitting: Anesthesiology

## 2013-12-07 ENCOUNTER — Encounter (HOSPITAL_COMMUNITY)
Admission: AD | Disposition: A | Discharge status: Home / Self Care | Payer: Self-pay | Source: Ambulatory Visit | Attending: Obstetrics and Gynecology

## 2013-12-07 ENCOUNTER — Inpatient Hospital Stay (HOSPITAL_COMMUNITY): Admit: 2013-12-07 | Payer: Self-pay

## 2013-12-07 ENCOUNTER — Inpatient Hospital Stay (HOSPITAL_COMMUNITY)
Admission: AD | Admit: 2013-12-07 | Discharge: 2013-12-07 | Disposition: A | Discharge status: Home / Self Care | Payer: BC Managed Care – PPO | Source: Ambulatory Visit | Attending: Obstetrics and Gynecology | Admitting: Obstetrics and Gynecology

## 2013-12-07 ENCOUNTER — Encounter (HOSPITAL_COMMUNITY): Payer: BC Managed Care – PPO | Admitting: Anesthesiology

## 2013-12-07 ENCOUNTER — Inpatient Hospital Stay (HOSPITAL_COMMUNITY)
Admission: AD | Admit: 2013-12-07 | Discharge: 2013-12-10 | DRG: 765 | Disposition: A | Discharge status: Home / Self Care | Payer: BC Managed Care – PPO | Source: Ambulatory Visit | Attending: Obstetrics and Gynecology | Admitting: Obstetrics and Gynecology

## 2013-12-07 ENCOUNTER — Encounter (HOSPITAL_COMMUNITY): Payer: Self-pay | Admitting: *Deleted

## 2013-12-07 DIAGNOSIS — O321XX0 Maternal care for breech presentation, not applicable or unspecified: Secondary | ICD-10-CM

## 2013-12-07 DIAGNOSIS — Z8759 Personal history of other complications of pregnancy, childbirth and the puerperium: Secondary | ICD-10-CM | POA: Diagnosis present

## 2013-12-07 DIAGNOSIS — O139 Gestational [pregnancy-induced] hypertension without significant proteinuria, unspecified trimester: Secondary | ICD-10-CM

## 2013-12-07 HISTORY — DX: Essential (primary) hypertension: I10

## 2013-12-07 HISTORY — DX: Headache: R51

## 2013-12-07 LAB — RPR: RPR Ser Ql: NONREACTIVE

## 2013-12-07 LAB — CBC
HCT: 36.1 % (ref 36.0–46.0)
Hemoglobin: 12.3 g/dL (ref 12.0–15.0)
MCH: 28 pg (ref 26.0–34.0)
MCHC: 34.1 g/dL (ref 30.0–36.0)
MCV: 82 fL (ref 78.0–100.0)
Platelets: 166 10*3/uL (ref 150–400)
RBC: 4.4 MIL/uL (ref 3.87–5.11)
RDW: 16.2 % — ABNORMAL HIGH (ref 11.5–15.5)
WBC: 11.7 10*3/uL — ABNORMAL HIGH (ref 4.0–10.5)

## 2013-12-07 SURGERY — Surgical Case
Anesthesia: Regional

## 2013-12-07 SURGERY — Surgical Case
Anesthesia: Spinal | Site: Abdomen

## 2013-12-07 MED ORDER — DIPHENHYDRAMINE HCL 50 MG/ML IJ SOLN: 25.0000 mg | INTRAMUSCULAR | Status: DC | PRN

## 2013-12-07 MED ORDER — DIPHENHYDRAMINE HCL 50 MG/ML IJ SOLN: 12.5000 mg | INTRAMUSCULAR | Status: DC | PRN

## 2013-12-07 MED ORDER — SENNOSIDES-DOCUSATE SODIUM 8.6-50 MG PO TABS
2.0000 | ORAL_TABLET | ORAL | Status: DC
  Administered 2013-12-07 – 2013-12-09 (×3): 2 via ORAL
  Filled 2013-12-07 (×3): qty 2

## 2013-12-07 MED ORDER — ONDANSETRON HCL 4 MG/2ML IJ SOLN
INTRAMUSCULAR | Status: DC | PRN
  Administered 2013-12-07: 4 mg via INTRAVENOUS

## 2013-12-07 MED ORDER — PRENATAL MULTIVITAMIN CH
1.0000 | ORAL_TABLET | Freq: Every day | ORAL | Status: DC
  Administered 2013-12-08 – 2013-12-10 (×3): 1 via ORAL
  Filled 2013-12-07 (×3): qty 1

## 2013-12-07 MED ORDER — METHYLERGONOVINE MALEATE 0.2 MG/ML IJ SOLN: 0.2000 mg | INTRAMUSCULAR | Status: DC | PRN

## 2013-12-07 MED ORDER — LANOLIN HYDROUS EX OINT: 1.0000 "application " | TOPICAL_OINTMENT | CUTANEOUS | Status: DC | PRN

## 2013-12-07 MED ORDER — ONDANSETRON HCL 4 MG/2ML IJ SOLN: 4.0000 mg | INTRAMUSCULAR | Status: DC | PRN

## 2013-12-07 MED ORDER — MORPHINE SULFATE (PF) 0.5 MG/ML IJ SOLN
INTRAMUSCULAR | Status: DC | PRN
  Administered 2013-12-07: .15 mg via INTRATHECAL

## 2013-12-07 MED ORDER — NALOXONE HCL 0.4 MG/ML IJ SOLN: 0.4000 mg | INTRAMUSCULAR | Status: DC | PRN

## 2013-12-07 MED ORDER — SIMETHICONE 80 MG PO CHEW
80.0000 mg | CHEWABLE_TABLET | ORAL | Status: DC
  Administered 2013-12-07 – 2013-12-09 (×3): 80 mg via ORAL
  Filled 2013-12-07 (×3): qty 1

## 2013-12-07 MED ORDER — MEPERIDINE HCL 25 MG/ML IJ SOLN: 6.2500 mg | INTRAMUSCULAR | Status: DC | PRN

## 2013-12-07 MED ORDER — PHENYLEPHRINE HCL 10 MG/ML IJ SOLN
INTRAMUSCULAR | Status: AC
  Filled 2013-12-07: qty 1

## 2013-12-07 MED ORDER — OXYTOCIN 10 UNIT/ML IJ SOLN
INTRAMUSCULAR | Status: AC
  Filled 2013-12-07: qty 4

## 2013-12-07 MED ORDER — SCOPOLAMINE 1 MG/3DAYS TD PT72
1.0000 | MEDICATED_PATCH | Freq: Once | TRANSDERMAL | Status: AC
  Administered 2013-12-07: 1.5 mg via TRANSDERMAL

## 2013-12-07 MED ORDER — MEASLES, MUMPS & RUBELLA VAC ~~LOC~~ INJ
0.5000 mL | INJECTION | Freq: Once | SUBCUTANEOUS | Status: DC
  Filled 2013-12-07: qty 0.5

## 2013-12-07 MED ORDER — CEFAZOLIN SODIUM 10 G IJ SOLR
3.0000 g | INTRAMUSCULAR | Status: AC
  Administered 2013-12-07: 3 g via INTRAVENOUS
  Filled 2013-12-07: qty 3000

## 2013-12-07 MED ORDER — BUPIVACAINE HCL (PF) 0.25 % IJ SOLN
INTRAMUSCULAR | Status: AC
  Filled 2013-12-07: qty 20

## 2013-12-07 MED ORDER — LACTATED RINGERS IV SOLN
INTRAVENOUS | Status: DC
  Administered 2013-12-07: 1000 mL/h via INTRAVENOUS

## 2013-12-07 MED ORDER — FENTANYL CITRATE 0.05 MG/ML IJ SOLN
INTRAMUSCULAR | Status: DC | PRN
  Administered 2013-12-07: 25 ug via INTRATHECAL

## 2013-12-07 MED ORDER — ONDANSETRON HCL 4 MG PO TABS: 4.0000 mg | ORAL_TABLET | ORAL | Status: DC | PRN

## 2013-12-07 MED ORDER — TETANUS-DIPHTH-ACELL PERTUSSIS 5-2.5-18.5 LF-MCG/0.5 IM SUSP: 0.5000 mL | Freq: Once | INTRAMUSCULAR | Status: DC

## 2013-12-07 MED ORDER — ONDANSETRON HCL 4 MG/2ML IJ SOLN
INTRAMUSCULAR | Status: AC
  Filled 2013-12-07: qty 2

## 2013-12-07 MED ORDER — FENTANYL CITRATE 0.05 MG/ML IJ SOLN: 25.0000 ug | INTRAMUSCULAR | Status: DC | PRN

## 2013-12-07 MED ORDER — PHENYLEPHRINE 8 MG IN D5W 100 ML (0.08MG/ML) PREMIX OPTIME
INJECTION | INTRAVENOUS | Status: DC | PRN
  Administered 2013-12-07: 60 ug/min via INTRAVENOUS

## 2013-12-07 MED ORDER — LABETALOL HCL 200 MG PO TABS
200.0000 mg | ORAL_TABLET | Freq: Two times a day (BID) | ORAL | Status: DC
  Filled 2013-12-07 (×2): qty 1

## 2013-12-07 MED ORDER — BUPIVACAINE IN DEXTROSE 0.75-8.25 % IT SOLN
INTRATHECAL | Status: DC | PRN
  Administered 2013-12-07: 1.8 mL via INTRATHECAL

## 2013-12-07 MED ORDER — ZOLPIDEM TARTRATE 5 MG PO TABS: 5.0000 mg | ORAL_TABLET | Freq: Every evening | ORAL | Status: DC | PRN

## 2013-12-07 MED ORDER — ASPIRIN 81 MG PO TABS: 81.0000 mg | ORAL_TABLET | Freq: Every day | ORAL | Status: DC

## 2013-12-07 MED ORDER — FENTANYL CITRATE 0.05 MG/ML IJ SOLN
INTRAMUSCULAR | Status: AC
  Filled 2013-12-07: qty 2

## 2013-12-07 MED ORDER — FERROUS SULFATE 325 (65 FE) MG PO TABS
325.0000 mg | ORAL_TABLET | Freq: Two times a day (BID) | ORAL | Status: DC
  Administered 2013-12-08 – 2013-12-10 (×5): 325 mg via ORAL
  Filled 2013-12-07 (×5): qty 1

## 2013-12-07 MED ORDER — DIPHENHYDRAMINE HCL 25 MG PO CAPS
25.0000 mg | ORAL_CAPSULE | ORAL | Status: DC | PRN
  Filled 2013-12-07: qty 1

## 2013-12-07 MED ORDER — WITCH HAZEL-GLYCERIN EX PADS: 1.0000 "application " | MEDICATED_PAD | CUTANEOUS | Status: DC | PRN

## 2013-12-07 MED ORDER — OXYTOCIN 40 UNITS IN LACTATED RINGERS INFUSION - SIMPLE MED: 62.5000 mL/h | INTRAVENOUS | Status: AC

## 2013-12-07 MED ORDER — SODIUM CHLORIDE 0.9 % IJ SOLN: 3.0000 mL | INTRAMUSCULAR | Status: DC | PRN

## 2013-12-07 MED ORDER — LACTATED RINGERS IV SOLN
INTRAVENOUS | Status: DC | PRN
  Administered 2013-12-07 (×3): via INTRAVENOUS

## 2013-12-07 MED ORDER — DIPHENHYDRAMINE HCL 25 MG PO CAPS
25.0000 mg | ORAL_CAPSULE | Freq: Four times a day (QID) | ORAL | Status: DC | PRN
  Administered 2013-12-08 (×2): 25 mg via ORAL
  Filled 2013-12-07: qty 1

## 2013-12-07 MED ORDER — OXYCODONE-ACETAMINOPHEN 5-325 MG PO TABS
1.0000 | ORAL_TABLET | ORAL | Status: DC | PRN
  Administered 2013-12-08 – 2013-12-10 (×4): 1 via ORAL
  Filled 2013-12-07 (×4): qty 1

## 2013-12-07 MED ORDER — DIBUCAINE 1 % RE OINT: 1.0000 "application " | TOPICAL_OINTMENT | RECTAL | Status: DC | PRN

## 2013-12-07 MED ORDER — ONDANSETRON HCL 4 MG/2ML IJ SOLN: 4.0000 mg | Freq: Three times a day (TID) | INTRAMUSCULAR | Status: DC | PRN

## 2013-12-07 MED ORDER — BUPIVACAINE HCL (PF) 0.25 % IJ SOLN
INTRAMUSCULAR | Status: DC | PRN
  Administered 2013-12-07: 20 mL

## 2013-12-07 MED ORDER — LACTATED RINGERS IV SOLN
INTRAVENOUS | Status: DC
  Administered 2013-12-07: 19:00:00 via INTRAVENOUS

## 2013-12-07 MED ORDER — NALBUPHINE HCL 10 MG/ML IJ SOLN: 5.0000 mg | INTRAMUSCULAR | Status: DC | PRN

## 2013-12-07 MED ORDER — METHYLERGONOVINE MALEATE 0.2 MG PO TABS: 0.2000 mg | ORAL_TABLET | ORAL | Status: DC | PRN

## 2013-12-07 MED ORDER — SCOPOLAMINE 1 MG/3DAYS TD PT72
MEDICATED_PATCH | TRANSDERMAL | Status: AC
  Administered 2013-12-07: 1.5 mg via TRANSDERMAL
  Filled 2013-12-07: qty 1

## 2013-12-07 MED ORDER — SIMETHICONE 80 MG PO CHEW
80.0000 mg | CHEWABLE_TABLET | Freq: Three times a day (TID) | ORAL | Status: DC
  Administered 2013-12-08 – 2013-12-10 (×7): 80 mg via ORAL
  Filled 2013-12-07 (×9): qty 1

## 2013-12-07 MED ORDER — LEVOTHYROXINE SODIUM 112 MCG PO TABS
112.0000 ug | ORAL_TABLET | Freq: Every day | ORAL | Status: DC
  Administered 2013-12-08 – 2013-12-10 (×3): 112 ug via ORAL
  Filled 2013-12-07 (×4): qty 1

## 2013-12-07 MED ORDER — SIMETHICONE 80 MG PO CHEW: 80.0000 mg | CHEWABLE_TABLET | ORAL | Status: DC | PRN

## 2013-12-07 MED ORDER — MENTHOL 3 MG MT LOZG: 1.0000 | LOZENGE | OROMUCOSAL | Status: DC | PRN

## 2013-12-07 MED ORDER — ASPIRIN EC 81 MG PO TBEC
81.0000 mg | DELAYED_RELEASE_TABLET | Freq: Every day | ORAL | Status: DC
  Administered 2013-12-07 – 2013-12-10 (×4): 81 mg via ORAL
  Filled 2013-12-07 (×5): qty 1

## 2013-12-07 MED ORDER — MORPHINE SULFATE 0.5 MG/ML IJ SOLN
INTRAMUSCULAR | Status: AC
  Filled 2013-12-07: qty 10

## 2013-12-07 MED ORDER — IBUPROFEN 600 MG PO TABS
600.0000 mg | ORAL_TABLET | Freq: Four times a day (QID) | ORAL | Status: DC
  Administered 2013-12-07 – 2013-12-10 (×13): 600 mg via ORAL
  Filled 2013-12-07 (×13): qty 1

## 2013-12-07 MED ORDER — ENOXAPARIN SODIUM 60 MG/0.6ML ~~LOC~~ SOLN
60.0000 mg | Freq: Two times a day (BID) | SUBCUTANEOUS | Status: DC
  Administered 2013-12-08 – 2013-12-10 (×5): 60 mg via SUBCUTANEOUS
  Filled 2013-12-07 (×7): qty 0.6

## 2013-12-07 MED ORDER — NALOXONE HCL 1 MG/ML IJ SOLN
1.0000 ug/kg/h | INTRAVENOUS | Status: DC | PRN
  Filled 2013-12-07: qty 2

## 2013-12-07 MED ORDER — METOCLOPRAMIDE HCL 5 MG/ML IJ SOLN: 10.0000 mg | Freq: Three times a day (TID) | INTRAMUSCULAR | Status: DC | PRN

## 2013-12-07 SURGICAL SUPPLY — 38 items
APL SKNCLS STERI-STRIP NONHPOA (GAUZE/BANDAGES/DRESSINGS) ×1
BENZOIN TINCTURE PRP APPL 2/3 (GAUZE/BANDAGES/DRESSINGS) ×2 IMPLANT
BOOTIES KNEE HIGH SLOAN (MISCELLANEOUS) ×4 IMPLANT
CLAMP CORD UMBIL (MISCELLANEOUS) IMPLANT
CLOTH BEACON ORANGE TIMEOUT ST (SAFETY) ×2 IMPLANT
DRAIN JACKSON PRT FLT 10 (DRAIN) ×1 IMPLANT
DRAPE LG THREE QUARTER DISP (DRAPES) IMPLANT
DRSG OPSITE POSTOP 4X10 (GAUZE/BANDAGES/DRESSINGS) ×2 IMPLANT
DURAPREP 26ML APPLICATOR (WOUND CARE) ×2 IMPLANT
ELECT REM PT RETURN 9FT ADLT (ELECTROSURGICAL) ×2
ELECTRODE REM PT RTRN 9FT ADLT (ELECTROSURGICAL) ×1 IMPLANT
EVACUATOR SILICONE 100CC (DRAIN) ×1 IMPLANT
EXTRACTOR VACUUM M CUP 4 TUBE (SUCTIONS) IMPLANT
GAUZE SPONGE 4X4 12PLY STRL LF (GAUZE/BANDAGES/DRESSINGS) ×1 IMPLANT
GLOVE BIOGEL PI IND STRL 7.0 (GLOVE) ×1 IMPLANT
GLOVE BIOGEL PI INDICATOR 7.0 (GLOVE) ×1
GLOVE ECLIPSE 6.5 STRL STRAW (GLOVE) ×2 IMPLANT
GOWN STRL REUS W/TWL LRG LVL3 (GOWN DISPOSABLE) ×4 IMPLANT
KIT ABG SYR 3ML LUER SLIP (SYRINGE) IMPLANT
NDL HYPO 25X5/8 SAFETYGLIDE (NEEDLE) IMPLANT
NEEDLE HYPO 22GX1.5 SAFETY (NEEDLE) ×2 IMPLANT
NEEDLE HYPO 25X5/8 SAFETYGLIDE (NEEDLE) IMPLANT
NS IRRIG 1000ML POUR BTL (IV SOLUTION) ×4 IMPLANT
PACK C SECTION WH (CUSTOM PROCEDURE TRAY) ×2 IMPLANT
PAD ABD 7.5X8 STRL (GAUZE/BANDAGES/DRESSINGS) ×1 IMPLANT
PAD OB MATERNITY 4.3X12.25 (PERSONAL CARE ITEMS) ×2 IMPLANT
RTRCTR C-SECT PINK 25CM LRG (MISCELLANEOUS) ×2 IMPLANT
STRIP CLOSURE SKIN 1/2X4 (GAUZE/BANDAGES/DRESSINGS) ×2 IMPLANT
SUT CHROMIC GUT AB #0 18 (SUTURE) IMPLANT
SUT MNCRL AB 3-0 PS2 27 (SUTURE) ×2 IMPLANT
SUT SILK 2 0 FSL 18 (SUTURE) IMPLANT
SUT VIC AB 0 CTX 36 (SUTURE) ×4
SUT VIC AB 0 CTX36XBRD ANBCTRL (SUTURE) ×2 IMPLANT
SUT VIC AB 1 CT1 36 (SUTURE) ×4 IMPLANT
SYR 20CC LL (SYRINGE) ×2 IMPLANT
TOWEL OR 17X24 6PK STRL BLUE (TOWEL DISPOSABLE) ×2 IMPLANT
TRAY FOLEY CATH 14FR (SET/KITS/TRAYS/PACK) ×2 IMPLANT
WATER STERILE IRR 1000ML POUR (IV SOLUTION) ×2 IMPLANT

## 2013-12-07 NOTE — Anesthesia Procedure Notes (Signed)
Spinal  Patient location during procedure: OR Start time: 12/07/2013 12:50 PM Staffing Anesthesiologist: Breyon Blass A. Performed by: anesthesiologist  Preanesthetic Checklist Completed: patient identified, site marked, surgical consent, pre-op evaluation, timeout performed, IV checked, risks and benefits discussed and monitors and equipment checked Spinal Block Patient position: sitting Prep: site prepped and draped and DuraPrep Patient monitoring: heart rate, cardiac monitor, continuous pulse ox and blood pressure Approach: midline Location: L3-4 Injection technique: single-shot Needle Needle type: Sprotte  Needle gauge: 24 G Needle length: 9 cm Needle insertion depth: 7 cm Assessment Sensory level: T4 Additional Notes Patient tolerated procedure well. Adequate sensory level.

## 2013-12-07 NOTE — Anesthesia Postprocedure Evaluation (Signed)
  Anesthesia Post-op Note  Patient: Morgan Nielsen  Procedure(s) Performed: Procedure(s): CESAREAN SECTION (N/A)  Patient Location: PACU  Anesthesia Type:Spinal  Level of Consciousness: awake, alert  and oriented  Airway and Oxygen Therapy: Patient Spontanous Breathing  Post-op Pain: none  Post-op Assessment: Post-op Vital signs reviewed, Patient's Cardiovascular Status Stable, Respiratory Function Stable, Patent Airway, No signs of Nausea or vomiting, Pain level controlled, No headache and No backache  Post-op Vital Signs: Reviewed and stable  Complications: No apparent anesthesia complications

## 2013-12-07 NOTE — Interval H&P Note (Signed)
History and Physical Interval Note:  12/07/2013 12:45 PM  Morgan CruiseJasmine S Botsford  has presented today for surgery, with the diagnosis of breech presentation, primary c/c, history of PE  The various methods of treatment have been discussed with the patient and family. After consideration of risks, benefits and other options for treatment, the patient has consented to  Procedure(s): CESAREAN SECTION (N/A) as a surgical intervention .  The patient's history has been reviewed, patient examined, no change in status, stable for surgery.  I have reviewed the patient's chart and labs.  Patient again confirms that she refuses blood products under all circumstances.Questions were answered to the patient's satisfaction.     Alexa Blish A

## 2013-12-07 NOTE — Transfer of Care (Signed)
Immediate Anesthesia Transfer of Care Note  Patient: Morgan CruiseJasmine S Dunbar  Procedure(s) Performed: Procedure(s): CESAREAN SECTION (N/A)  Patient Location: PACU  Anesthesia Type:Spinal  Level of Consciousness: awake  Airway & Oxygen Therapy: Patient Spontanous Breathing  Post-op Assessment: Report given to PACU RN  Post vital signs: Reviewed and stable  Complications: No apparent anesthesia complications

## 2013-12-07 NOTE — Op Note (Signed)
Preoperative diagnosis: Intrauterine pregnancy at 38 weeks and 4 days, IUGR, breech, CHTN, anticoagulotherapy for previous pulmonary embolism   Post operative diagnosis: Same and partial placenta accreta  Anesthesia: Spinal  Anesthesiologist: Dr. Malen GauzeFoster  Procedure: Primary low transverse cesarean section  Surgeon: Dr. Dois DavenportSandra Shakema Surita  Assistant: Nigel BridgemanVicki Latham CNM  Estimated blood loss: 800 cc  Procedure:  After being informed of the planned procedure and possible complications including bleeding, infection, injury to other organs, informed consent is obtained. The patient is taken to OR #2 and given spinal anesthesia without complication. She is placed in the dorsal decubitus position with the pelvis tilted to the left. She is then prepped and draped in a sterile fashion. A Foley catheter is inserted in her bladder.  After assessing adequate level of anesthesia, we infiltrate the suprapubic area with 20 cc of Marcaine 0.25 and perform a Pfannenstiel incision which is brought down sharply to the fascia. The fascia is entered in a low transverse fashion. Linea alba is dissected. Peritoneum is entered in a midline fashion. An Alexis retractor is easily positioned.   The myometrium is then entered in a low transverse fashion, 2 cm above the vesico-uterine junction ; first with knife and then extended bluntly. Amniotic fluid is clear. We assist the birth of a female  infant in footling breech presentation.The baby is delivered. Mouth and nose are suctioned.  The cord is clamped and sectioned. A nuchal cord as well as a body loop are noted.The baby is given to the neonatologist present in the room.  10cc of blood is drawn from the umbilical vein and 2 cc of blood is drawn from the umbilical artery for cord gas.The placenta is delivered manually and has a 3x3 cm area of partial accreta. It is complete and the cord has 3 vessels. We proceed with curettage of the uterine cavity Uterine revision is now  negative.  We proceed with closure of the myometrium in 2 layers: First with a running locked suture of 0 Vicryl, then with a Lembert suture of 0 Vicryl imbricating the first one. Hemostasis is completed with cauterization on peritoneal edges.  Both paracolic gutters are cleaned. Both tubes and ovaries are assessed and normal. The pelvis is profusely irrigated with warm saline to confirm a satisfactory hemostasis.  Retractors and sponges are removed. Under fascia hemostasis is completed with cauterization. The fascia is then closed with 2 running sutures of 0 Vicryl meeting midline. The wound is irrigated with warm saline and hemostasis is completed with cauterization. The skin is closed with a subcuticular suture of 3-0 Monocryl and Steri-Strips.  Instrument and sponge count is complete x2. Estimated blood loss is 800 cc.  The procedure is well tolerated by the patient who is taken to recovery room in a well and stable condition.  female baby  was born at 13:24 and received an Apgar of 8  at 1 minute and 8 at 5 minutes.    Specimen: Placenta sent to [facility] & [facility]   Azarel Banner A MD 2/28/20152:30 PM

## 2013-12-07 NOTE — Anesthesia Preprocedure Evaluation (Addendum)
Anesthesia Evaluation  Patient identified by MRN, date of birth, ID band Patient awake    Reviewed: Allergy & Precautions, H&P , NPO status , Patient's Chart, lab work & pertinent test results  Airway Mallampati: III TM Distance: >3 FB Neck ROM: Full    Dental no notable dental hx. (+) Teeth Intact   Pulmonary asthma ,  breath sounds clear to auscultation  Pulmonary exam normal       Cardiovascular hypertension, Pt. on medications and Pt. on home beta blockers Rhythm:Regular Rate:Normal  Hx/o PTE  Was on lovenox 60mg  Bid. 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   Anesthesia Other Findings   Reproductive/Obstetrics (+) Pregnancy Breech presentation                          Anesthesia Physical Anesthesia Plan  ASA: III  Anesthesia Plan: Spinal   Post-op Pain Management:    Induction:   Airway Management Planned: Natural Airway  Additional Equipment:   Intra-op Plan:   Post-operative Plan:   Informed Consent: I have reviewed the patients History and Physical, chart, labs and discussed the procedure including the risks, benefits and alternatives for the proposed anesthesia with the patient or authorized representative who has indicated his/her understanding and acceptance.     Plan Discussed with: Anesthesiologist, CRNA and Surgeon  Anesthesia Plan Comments:         Anesthesia Quick Evaluation

## 2013-12-08 LAB — CBC
HCT: 34.9 % — ABNORMAL LOW (ref 36.0–46.0)
Hemoglobin: 11.4 g/dL — ABNORMAL LOW (ref 12.0–15.0)
MCH: 27.1 pg (ref 26.0–34.0)
MCHC: 32.7 g/dL (ref 30.0–36.0)
MCV: 83.1 fL (ref 78.0–100.0)
Platelets: 167 10*3/uL (ref 150–400)
RBC: 4.2 MIL/uL (ref 3.87–5.11)
RDW: 16.5 % — ABNORMAL HIGH (ref 11.5–15.5)
WBC: 11.7 10*3/uL — ABNORMAL HIGH (ref 4.0–10.5)

## 2013-12-08 MED ORDER — LABETALOL HCL 200 MG PO TABS
200.0000 mg | ORAL_TABLET | Freq: Two times a day (BID) | ORAL | Status: DC
  Administered 2013-12-09 – 2013-12-10 (×3): 200 mg via ORAL
  Filled 2013-12-08 (×6): qty 1

## 2013-12-08 NOTE — Progress Notes (Signed)
Subjective: Postpartum Day 1: Cesarean Delivery due to scheduled primary due to breech, on Lovenox for hx DVT, gestational hypertension. Patient up ad lib, reports no syncope or dizziness.  Foley removed around 3am, no void yet Feeding:  Breast Contraceptive plan:  Undecided  Objective: Vital signs in last 24 hours: Temp:  [96.9 F (36.1 C)-98.7 F (37.1 C)] 98.7 F (37.1 C) (03/01 0630) Pulse Rate:  [69-90] 84 (03/01 0630) Resp:  [16-32] 16 (03/01 0630) BP: (102-144)/(59-89) 102/63 mmHg (03/01 0630) SpO2:  [94 %-100 %] 97 % (03/01 0630) Weight:  [298 lb (135.172 kg)] 298 lb (135.172 kg) (02/28 1158)  Filed Vitals:   12/08/13 0030 12/08/13 0230 12/08/13 0630 12/08/13 1013  BP: 115/68 115/71 102/63 117/76  Pulse: 87 90 84 83  Temp: 98.7 F (37.1 C) 98.1 F (36.7 C) 98.7 F (37.1 C) 98.8 F (37.1 C)  TempSrc: Oral Oral Oral Oral  Resp: 16 16 16 16   Height:      Weight:      SpO2: 94% 95% 97% 98%    Physical Exam:  General: alert Lochia: appropriate Uterine Fundus: firm Incision: Honeycomb dressing CDI DVT Evaluation: No evidence of DVT seen on physical exam. Negative Homan's sign. JP drain:   Output overnight not documented, has approx 5 cc this am.   Recent Labs  12/07/13 1140 12/08/13 0549  HGB 12.3 11.4*  HCT 36.1 34.9*    Assessment/Plan: Status post Cesarean section day 1, with partial placenta accreta Hx DVT 2002--to restart Lovenox 60 mg BID today. Gestational hypertension--on Labetalol 200 mg po BID, with decreased BP. Hypothyroidism--on Synthroid 112 mcg daily Rh negative--newborn blood type pending.  Plan: Lovenox 60 mg SQ BID--starting at 2pm Establish parameters for holding Labetalol Encourage voiding attempts q 2 hours.  Nekita Pita 12/08/2013, 10:12 AM

## 2013-12-08 NOTE — Addendum Note (Signed)
Addendum created 12/08/13 0755 by Algis GreenhouseLinda A Havanah Nelms, CRNA   Modules edited: Notes Section   Notes Section:  File: [id number]

## 2013-12-08 NOTE — Anesthesia Postprocedure Evaluation (Signed)
Anesthesia Post Note  Patient: Morgan Nielsen  Procedure(s) Performed: Procedure(s) (LRB): CESAREAN SECTION (N/A)  Anesthesia type: Spinal  Patient location: Mother/Baby  Post pain: Pain level controlled  Post assessment: Post-op Vital signs reviewed  Last Vitals:  Filed Vitals:   12/08/13 0630  BP: 102/63  Pulse: 84  Temp: 37.1 C  Resp: 16    Post vital signs: Reviewed  Level of consciousness: awake  Complications: No apparent anesthesia complications

## 2013-12-09 ENCOUNTER — Inpatient Hospital Stay (HOSPITAL_COMMUNITY): Admission: RE | Admit: 2013-12-09 | Payer: BC Managed Care – PPO | Source: Ambulatory Visit

## 2013-12-09 ENCOUNTER — Encounter (HOSPITAL_COMMUNITY): Payer: Self-pay | Admitting: Obstetrics and Gynecology

## 2013-12-09 NOTE — Lactation Note (Signed)
This note was copied from the chart of Morgan Garfield CorneaJasmine Corporan. Lactation Consultation Note Follow up visit at 50 hours of age.  Baby is latched on left breast with #20 nipple shield with good rhythm and few swallows observed.  Hand expression taught with return demonstration with little colostrum visible.  Reviewed basics including feeding frequency.  8 feedings over the past 24 hours with 4 voids and 4 stools.    Patient Name: Morgan Nielsen: 12/09/2013 Reason for consult: Follow-up assessment   Maternal Data Has patient been taught Hand Expression?: Yes Does the patient have breastfeeding experience prior to this delivery?: No  Feeding Feeding Type: Breast Milk Length of feed: 12 min  LATCH Score/Interventions Latch: Grasps breast easily, tongue down, lips flanged, rhythmical sucking. Intervention(s): Skin to skin;Teach feeding cues;Waking techniques  Audible Swallowing: A few with stimulation Intervention(s): Skin to skin;Hand expression  Type of Nipple: Everted at rest and after stimulation  Comfort (Breast/Nipple): Soft / non-tender     Hold (Positioning): No assistance needed to correctly position infant at breast. Intervention(s): Breastfeeding basics reviewed;Support Pillows;Skin to skin  LATCH Score: 9  Lactation Tools Discussed/Used Tools: Nipple Shields Nipple shield size: 20   Consult Status Consult Status: Follow-up Nielsen: 12/10/13 Follow-up type: In-patient    Jannifer RodneyShoptaw, Jana Lynn 12/09/2013, 4:46 PM

## 2013-12-09 NOTE — Progress Notes (Signed)
Subjective: Postpartum Day 2: Cesarean Delivery Patient reports tolerating PO, + flatus and no problems voiding.  No Cp or SOB  Objective: Vital signs in last 24 hours: Temp:  [98.2 F (36.8 C)-98.6 F (37 C)] 98.2 F (36.8 C) (03/02 0615) Pulse Rate:  [82-93] 88 (03/02 1108) Resp:  [16-18] 18 (03/02 0615) BP: (116-142)/(58-86) 142/78 mmHg (03/02 1108) JP 30 cc seroud drainage Physical Exam:  General: alert and cooperative Lochia: appropriate Uterine Fundus: firm Incision: healing well DVT Evaluation: No evidence of DVT seen on physical exam. CV RRR Lungs CTA B   Recent Labs  12/07/13 1140 12/08/13 0549  HGB 12.3 11.4*  HCT 36.1 34.9*   Recent Results (from the past 2160 hour(s))  CBC     Status: Abnormal   Collection Time    12/07/13 11:40 AM      Result Value Ref Range   WBC 11.7 (*) 4.0 - 10.5 K/uL   RBC 4.40  3.87 - 5.11 MIL/uL   Hemoglobin 12.3  12.0 - 15.0 g/dL   HCT 16.136.1  09.636.0 - 04.546.0 %   MCV 82.0  78.0 - 100.0 fL   MCH 28.0  26.0 - 34.0 pg   MCHC 34.1  30.0 - 36.0 g/dL   RDW 40.916.2 (*) 81.111.5 - 91.415.5 %   Platelets 166  150 - 400 K/uL  RPR     Status: None   Collection Time    12/07/13 11:40 AM      Result Value Ref Range   RPR NON REACTIVE  NON REACTIVE   Comment: Performed at [facility]  CBC     Status: Abnormal   Collection Time    12/08/13  5:49 AM      Result Value Ref Range   WBC 11.7 (*) 4.0 - 10.5 K/uL   RBC 4.20  3.87 - 5.11 MIL/uL   Hemoglobin 11.4 (*) 12.0 - 15.0 g/dL   HCT 78.234.9 (*) 95.636.0 - 21.346.0 %   MCV 83.1  78.0 - 100.0 fL   MCH 27.1  26.0 - 34.0 pg   MCHC 32.7  30.0 - 36.0 g/dL   RDW 08.616.5 (*) 57.811.5 - 46.915.5 %   Platelets 167  150 - 400 K/uL    Assessment/Plan: Status post Cesarean section. Doing well postoperatively. blood pressures  Continue current care.continue lovenox for 6 weeks.  She has H/o of PE and protein S deficiency Will do paragard or barrier method [facility].  Pt is BF Blood pressure controlled on  labetalol  Shanay Woolman A 12/09/2013, 12:28 PM

## 2013-12-10 MED ORDER — OXYCODONE-ACETAMINOPHEN 5-325 MG PO TABS: 1.0000 | ORAL_TABLET | Freq: Four times a day (QID) | ORAL | Status: DC | PRN

## 2013-12-10 MED ORDER — IBUPROFEN 600 MG PO TABS: 600.0000 mg | ORAL_TABLET | Freq: Four times a day (QID) | ORAL | Status: DC | PRN

## 2013-12-10 NOTE — Lactation Note (Signed)
This note was copied from the chart of Morgan Garfield CorneaJasmine Nielsen. Lactation Consultation Note  Patient Name: Morgan Nielsen ZOXWR'UToday's Date: 12/10/2013 Reason for consult: Follow-up assessment;Infant weight loss;Infant < 6lbs Mom has been using 20 nipple shield to Lt. Breast, not using nipple shield to Rt. Breast, states sore, noted positional stripe. Baby at 12% weight loss. Suck exam noted chewing, slight palate, lips and tongue dry and peeling skin. Discussed w/mom baby may not be transfering milk well d/t poor sucking pattern. Advised to supplement breast colostrum then formula to equal 20ml today per guidelines  of Hrs. Of age. DEBP set up. Mom pumped w/5910ml colostrum received, given via bottle w/slow flow nipple w/suck training. Followed w/8610ml 22 cal.Similac to equal 20 ml. Written plan given to mom. 1. Breastfeed whenever the baby is hungry but at least every 3 hrs. 2. Pre-pump for 3-5 min. To get milk flow going. 3. Latch baby using nipple shield. 4. Keep baby nursing for up to 30 min. 5. Dad to give supplement of breast milk then formula, using slow flow nipple in bottle. Do suck training 6. Mom to post pump for 15 min.  Gave nipple shield to 24, left phone # for mom to call for next feeding assessment and fitting of nipple shield.  Maternal Data    Feeding Feeding Type: Formula Nipple Type: Slow - flow Length of feed: 30 min  LATCH Score/Interventions                      Lactation Tools Discussed/Used Tools: Pump;Nipple Shields Nipple shield size: 20;24 Breast pump type: Double-Electric Breast Pump   Consult Status Consult Status: Follow-up Date: 12/10/13 Follow-up type: In-patient    Morgan Nielsen, Morgan NickelLAURA Nielsen 12/10/2013, 10:26 AM

## 2013-12-10 NOTE — Lactation Note (Signed)
This note was copied from the chart of Morgan Nielsen. Lactation Consultation Note  Patient Name: Morgan Garfield CorneaJasmine Lubbers HQION'GToday's Date: 12/10/2013 Reason for consult: Follow-up assessment;Infant weight loss;Infant < 6lbs Assisted w/20 nipple shield. Demonstrated to mom how to pre-load nipple shield w/curve tip syring using colostrum from pre-pump. Baby sleepy at breast using nipple shield, SNS w/formula given to stimulate baby to feed. Noted sucessful feeding transfer of milk. Demonstrated different feeding options and stimulating baby. Mom very receptive to education of feeding options.  Maternal Data    Feeding Feeding Type: Breast Milk with Formula added Length of feed: 30 min  LATCH Score/Interventions Latch: Repeated attempts needed to sustain latch, nipple held in mouth throughout feeding, stimulation needed to elicit sucking reflex. Intervention(s): Skin to skin;Waking techniques Intervention(s): Adjust position;Assist with latch;Breast massage;Breast compression  Audible Swallowing: Spontaneous and intermittent (w/formula SNS breast) Intervention(s): Skin to skin  Type of Nipple: Everted at rest and after stimulation  Comfort (Breast/Nipple): Engorged, cracked, bleeding, large blisters, severe discomfort     Hold (Positioning): Assistance needed to correctly position infant at breast and maintain latch. Intervention(s): Support Pillows;Position options;Skin to skin  LATCH Score: 6  Lactation Tools Discussed/Used Tools: Nipple Shields;Pump;Supplemental Nutrition System;Other (comment) (curve tip syring) Nipple shield size: 20 Breast pump type: Double-Electric Breast Pump   Consult Status Consult Status: Follow-up Date: 12/11/13 Follow-up type: In-patient    Charyl DancerCARVER, Mehak Roskelley G 12/10/2013, 2:31 PM

## 2013-12-10 NOTE — Discharge Summary (Signed)
Obstetric Discharge Summary Reason for Admission: cesarean section due to IUGR and Breech with h/o PE on therapeutic lovenox Prenatal Procedures: ultrasound Intrapartum Procedures: 1 LTCS Postpartum Procedures: none except continuation of lovenox and labetalol Complications-Operative and Postpartum: none Hemoglobin  Date Value Ref Range Status  12/08/2013 11.4* 12.0 - 15.0 g/dL Final     HCT  Date Value Ref Range Status  12/08/2013 34.9* 36.0 - 46.0 % Final   7.5 cc drainage from JP over last shift.  Ambulating without difficult, voiding and tolerating po. Will d/c JP drain prior to d/c home.  Physical Exam:  General: alert and no distress Lochia: appropriate Uterine Fundus: firm Incision: healing well with dressing in place as well as JP drain that will removed prior to discharge DVT Evaluation: No evidence of DVT seen on physical exam.  Discharge Diagnoses: Term Pregnancy-delivered  Discharge Information: Date: 12/10/2013 Activity: pelvic rest Diet: routine Medications: Ibuprofen, Percocet and lovenox, labetalol and synthroid (she has all her meds except the pain meds and will cont therapeutic lovenox for 6wks) Condition: stable Instructions: refer to practice specific booklet Discharge to: home Follow-up Information   [facility] 6 weeks. (for post partum visit)    Specialty:  Obstetrics and Gynecology   Contact information:   [address] [place] [id number] [phone]      Newborn Data: Live born female  Birth Weight: 6 lb 7.9 oz (2945 g) APGAR: 8, 8  Home with mother but pt not being discharged today and pt will room in tonight.  Morgan Nielsen,Morgan Nielsen 12/10/2013, 5:46 PM

## 2013-12-11 ENCOUNTER — Ambulatory Visit: Payer: Self-pay

## 2013-12-11 ENCOUNTER — Encounter (HOSPITAL_COMMUNITY)
Admission: RE | Admit: 2013-12-11 | Discharge: 2013-12-11 | Disposition: A | Discharge status: Home / Self Care | Payer: BC Managed Care – PPO | Source: Ambulatory Visit | Attending: Obstetrics and Gynecology | Admitting: Obstetrics and Gynecology

## 2013-12-11 DIAGNOSIS — O923 Agalactia: Secondary | ICD-10-CM | POA: Insufficient documentation

## 2013-12-11 NOTE — Lactation Note (Signed)
This note was copied from the chart of Morgan Nielsen. Lactation Consultation Note Follow up consult:  Baby Morgan 5094 hours old. Weighed baby in clean diaper before feeding.  2.640 kg Mother massaged breasts and hand expressed slight amount of colostrum prior.  Mother placed baby in football hold on right breast.   Prefilled #20 NS with 5 ml of breastmilk.  Baby sucked intermittently, some with stimulation for 25 min.   Weighed baby in diaper after feeding 2.645 kg, breastmilk.     Mother then gave baby 20 ml of pumped breastmilk in slow flow bottle.   Mother will continue to post pump during daytime hours and supplement baby after breastfeeding.  Outpatient appointment made 3/12 1:00 pm.  Will call if questions or problems and try to get an earlier appt.     Patient Name: Morgan Garfield CorneaJasmine Traweek NWGNF'AToday's Date: 12/11/2013 Reason for consult: Follow-up assessment   Maternal Data    Feeding Feeding Type: Breast Fed Length of feed: 25 min  LATCH Score/Interventions Latch: Grasps breast easily, tongue down, lips flanged, rhythmical sucking. Intervention(s): Skin to skin;Waking techniques  Audible Swallowing: A few with stimulation  Type of Nipple: Everted at rest and after stimulation  Comfort (Breast/Nipple): Soft / non-tender  Problem noted: Mild/Moderate discomfort Interventions  (Cracked/bleeding/bruising/blister): Expressed breast milk to nipple;Double electric pump Interventions (Mild/moderate discomfort): Post-pump  Hold (Positioning): No assistance needed to correctly position infant at breast.  LATCH Score: 9  Lactation Tools Discussed/Used Tools: Nipple Shields Nipple shield size: 20 Breast pump type: Double-Electric Breast Pump   Consult Status Consult Status: Follow-up Date: 12/19/13 Follow-up type: Out-patient    Dahlia ByesBerkelhammer, Ruth [facility] 12/11/2013, 12:55 PM

## 2013-12-11 NOTE — Lactation Note (Signed)
This note was copied from the chart of Morgan Garfield CorneaJasmine Mcnair. Lactation Consultation Note Follow up consult:  Baby 4893 hours old.  Baby is sleeping, mother will call LC with next feeding and we will do a pre and post feeding weight to check the volume baby is getting.  Mother is post pumping after breastfeeding and getting good volume.  Last amount pumped 30 ml and gave to baby after breastfeeding.  Reviewed engorgement care, lactation support services and brochure.   Patient Name: Morgan Nielsen ZOXWR'UToday's Date: 12/11/2013 Reason for consult: Follow-up assessment   Maternal Data    Feeding Feeding Type: Breast Fed Length of feed: 20 min  LATCH Score/Interventions Latch: Grasps breast easily, tongue down, lips flanged, rhythmical sucking.  Audible Swallowing: Spontaneous and intermittent  Type of Nipple: Everted at rest and after stimulation  Comfort (Breast/Nipple): Filling, red/small blisters or bruises, mild/mod discomfort  Problem noted: Mild/Moderate discomfort Interventions  (Cracked/bleeding/bruising/blister): Expressed breast milk to nipple  Hold (Positioning): No assistance needed to correctly position infant at breast.  LATCH Score: 9  Lactation Tools Discussed/Used     Consult Status Consult Status: Follow-up Date: 12/11/13 Follow-up type: In-patient    Dahlia ByesBerkelhammer, Ruth [facility] 12/11/2013, 10:34 AM

## 2013-12-19 ENCOUNTER — Ambulatory Visit (HOSPITAL_COMMUNITY)
Admission: RE | Admit: 2013-12-19 | Discharge: 2013-12-19 | Disposition: A | Discharge status: Home / Self Care | Payer: BC Managed Care – PPO | Source: Ambulatory Visit | Attending: Obstetrics and Gynecology | Admitting: Obstetrics and Gynecology

## 2013-12-19 NOTE — Lactation Note (Signed)
Adult Lactation Consultation Outpatient Visit Note  Patient Name: Morgan Nielsen                                  " Morgan Nielsen" Date of Birth: 20-Oct-1985                                                    DOB, [birth year],BW: 6-7 Gestational Age at Delivery: 6590w4d                                  Todays weight: 6-8.2, 2954 Type of Delivery: C-Section                                               2312 Days old today  Breastfeeding History: Frequency of Breastfeeding: 3-4 times daily Length of Feeding: 20 mins Voids: 7 Stools: 8 yellow seedy  Supplementing / Method: Pumping:  Type of Pump:Symphony   Frequency:2 times daily  Volume:  240 ml  Comments: Mother has history of Hypothyroid and is taking Levothyroxine 112 mcg. (L-1). Mother is also hypertensive and is taking Labetalol  200mg  Bid. (L-2). Mother has a history of a blood clot (pulmonary embolism) in  2012. Mother is taking Lovenox Bid for 6 weeks. (L-3). Mother was scheduled an LC follow on day of discharge. Infant had a 12% weight loss on day of discharge. Baby was a difficult. Mother was fit with a #24 NS while in the hospital. Mother complaints of slight nipple tenderness when infant is latched. She states that she is very tired and has had her mother to do a lot of bottle feeding for her. She states she really wants to exclusively breastfeed infant .    Consultation Evaluation: Mother request to try and breastfeed infant without the nipple shield. Infant latched but was became sleepy and uninterested in the feeding. Infant transferred 10 ml.   Initial Feeding Assessment: Pre-feed [id number]:2954 Post-feed [id number]:2964 Amount Transferred:10 ml Comments:  Additional Feeding Assessment:Placed #24 nipple shield on the (L) breast. Assist mother with latching on using nipple to nose technique. Mother was taught breast compression. Infant sustained latch for 20 mins and transferred 32 ml. Pre-feed [id number]:2964 Post-feed [id number]:2595Weight:2996 Amount  Transferred:32 ml Comments:recommend supplementing infant with SNS or a bottle. Mother brought formula . Infant was given 30 ml.   Total Breast milk Transferred this Visit: 42 ml Total Supplement Given: 30 ml was given with a bottle by [facility]  Additional Interventions:  Recommend that mother begin to offer breast on cue before bottle feeding Use #24 nipple shield . Make sure infant has a wide open gape.  Advised to give infant supplement with SNS or a bottle ,  offer 30 ml, more as needed.  Advised mother to post pump at least 4-6 times daily for 20 mins.  Recommend that mother nap frequently  [facility] for feeding assessment Discussed using a breastfeeding supplement to increase milk supply.     Follow-Up  March 24 at 1 pm    Stevan BornKendrick, Lilie Vezina [facility] 12/19/2013,  1:17 PM

## 2013-12-31 ENCOUNTER — Ambulatory Visit (HOSPITAL_COMMUNITY)
Admission: RE | Admit: 2013-12-31 | Discharge: 2013-12-31 | Disposition: A | Discharge status: Home / Self Care | Payer: BC Managed Care – PPO | Source: Ambulatory Visit | Attending: Obstetrics and Gynecology | Admitting: Obstetrics and Gynecology

## 2013-12-31 NOTE — Lactation Note (Signed)
Adult Lactation Consultation Outpatient Visit Note:   Patient Name: Morgan Nielsen                             " Morgan Nielsen" Date of Birth: Feb 27, 1986                                                Weight today : 7.0,3174 Gestational Age at Delivery: Unknown                         3 weeks and 3 days  Type of Delivery: C-Section  Breastfeeding History: Frequency of Breastfeeding: piror to 2 days ago. Infant was breastfeeding for 20-30 mins on each breast Length of Feeding: Once in last 48 hours Voids: QS Stools: QS  Supplementing / Method: Pumping:  Type of Pump: Pump N Style   Frequency:4-6 times daily  Volume:  100-150 ml   Comments: Infant has continued to be a very difficult latch. Mother has been using the nipple shield until 2 days ago. She began having difficulty getting the nipple shield to stay in place . Mother states she can get infant latched to (R) breast but not to the (L) breast. Mother has been bottle feeding infant every 2-3 hours giving her at least 60-70 ml .    Consultation Evaluation: Mothers nipple tissue much more compressible this week. Attempt to use the #20 and #24 nipple shield. Difficulty with keeping the nipple shield in place. After several attempts infant was latched to the bare breast. Infant sustained latch for several mins before popping off breast. Observed that infant looses suction and slides to the tip of the breast. Repeatedly latched infant to breast . Infant was able to sustain latch for 10 mins with good breast compression preformed. She transferred only 6 ml  Oral exam: infants tongue cups like a bowl, she has difficulty keeping lips well flanged at breast. She has a high palate.  She was observed frequently chewing at breast. Mother states that milk leaks from the sides of infants mouth with each bottle.   Initial Feeding Assessment: (L) Pre-feed [id number] Post-feed Weight:3180 Amount Transferred:6 ml Comments:  Additional Feeding  Assessment:(R) infant place in cross cradle hold. Lots of assistance to widen infants gape. Observed that infant has a tight labial tie. Very difficult to roll lip upward . She sustained latch for 20-25 mins and transferred 26 ml  Pre-feed Weight:3180 Post-feed Weight:3206 Amount Transferred:26 ml Comments:Mothers breast very full and infant was placed on (R) breast 2 more times for additional feedings.   Additional Feeding Assessment:(R) Pre-feed Weight:3206 Post-feed [id number]:1478 Amount Transferred:12 ml  Comments:Additonal feeding: on (R) breast Pre feeding weight: 3218 Post feeding weight: 3252 Amt transferred  : 44 ml                                                     Total Breast milk Transferred this Visit: 88 ml Total Supplement Given:   Additional Interventions:  Advised mother to breastfeed every 2-3 hours and on cue, without the nipple shield. Recommend that she supplement infant with 1-1 1/2 ounces after each breastfeeding  with a bottle Encouraged mother to latch infant on with good depth and do frequent breast compression Continue to post pump 4-6 times daily for 20 mins.  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 Mother unable to come next week Mother to follow up in 2 weeks.    Follow-Up  April 7 at 2:30    Stevan BornKendrick, Yetzali Weld [facility] 12/31/2013, 1:13 PM

## 2014-01-01 ENCOUNTER — Encounter: Payer: Self-pay | Admitting: *Deleted

## 2014-01-11 ENCOUNTER — Encounter (HOSPITAL_COMMUNITY)
Admission: RE | Admit: 2014-01-11 | Discharge: 2014-01-11 | Disposition: A | Discharge status: Home / Self Care | Payer: BC Managed Care – PPO | Source: Ambulatory Visit | Attending: Obstetrics and Gynecology | Admitting: Obstetrics and Gynecology

## 2014-01-11 DIAGNOSIS — O923 Agalactia: Secondary | ICD-10-CM | POA: Insufficient documentation

## 2014-01-14 ENCOUNTER — Ambulatory Visit (HOSPITAL_COMMUNITY): Payer: BC Managed Care – PPO

## 2014-02-25 ENCOUNTER — Ambulatory Visit: Payer: BC Managed Care – PPO | Admitting: Nurse Practitioner

## 2014-08-11 ENCOUNTER — Encounter (HOSPITAL_COMMUNITY): Payer: Self-pay | Admitting: Obstetrics and Gynecology

## 2014-10-10 HISTORY — PX: BARIATRIC SURGERY: SHX1103

## 2017-05-12 ENCOUNTER — Ambulatory Visit (INDEPENDENT_AMBULATORY_CARE_PROVIDER_SITE_OTHER): Payer: Medicaid Other | Admitting: Obstetrics & Gynecology

## 2017-05-12 ENCOUNTER — Encounter: Payer: Self-pay | Admitting: Obstetrics & Gynecology

## 2017-05-12 VITALS — BP 124/72 | HR 62

## 2017-05-12 DIAGNOSIS — O0991 Supervision of high risk pregnancy, unspecified, first trimester: Secondary | ICD-10-CM | POA: Diagnosis not present

## 2017-05-12 DIAGNOSIS — O26899 Other specified pregnancy related conditions, unspecified trimester: Secondary | ICD-10-CM

## 2017-05-12 DIAGNOSIS — O99281 Endocrine, nutritional and metabolic diseases complicating pregnancy, first trimester: Secondary | ICD-10-CM | POA: Diagnosis not present

## 2017-05-12 DIAGNOSIS — O0990 Supervision of high risk pregnancy, unspecified, unspecified trimester: Secondary | ICD-10-CM

## 2017-05-12 DIAGNOSIS — I2699 Other pulmonary embolism without acute cor pulmonale: Secondary | ICD-10-CM | POA: Diagnosis not present

## 2017-05-12 DIAGNOSIS — E039 Hypothyroidism, unspecified: Secondary | ICD-10-CM

## 2017-05-12 DIAGNOSIS — Z6791 Unspecified blood type, Rh negative: Secondary | ICD-10-CM

## 2017-05-12 DIAGNOSIS — O09891 Supervision of other high risk pregnancies, first trimester: Secondary | ICD-10-CM

## 2017-05-12 DIAGNOSIS — O99280 Endocrine, nutritional and metabolic diseases complicating pregnancy, unspecified trimester: Principal | ICD-10-CM

## 2017-05-12 LAB — POCT URINALYSIS DIP (DEVICE)
Bilirubin Urine: NEGATIVE
Glucose, UA: NEGATIVE mg/dL
Hgb urine dipstick: NEGATIVE
Leukocytes, UA: NEGATIVE
Nitrite: NEGATIVE
Protein, ur: 30 mg/dL — AB
Specific Gravity, Urine: 1.02 (ref 1.005–1.030)
Urobilinogen, UA: 0.2 mg/dL (ref 0.0–1.0)
pH: 7 (ref 5.0–8.0)

## 2017-05-12 MED ORDER — ENOXAPARIN SODIUM 40 MG/0.4ML ~~LOC~~ SOLN: 40.0000 mg | SUBCUTANEOUS | 6 refills | Status: DC

## 2017-05-12 NOTE — Patient Instructions (Signed)
First Trimester of Pregnancy The first trimester of pregnancy is from week 1 until the end of week 13 (months 1 through 3). A week after a sperm fertilizes an egg, the egg will implant on the wall of the uterus. This embryo will begin to develop into a baby. Genes from you and your partner will form the baby. The female genes will determine whether the baby will be a boy or a girl. At 6-8 weeks, the eyes and face will be formed, and the heartbeat can be seen on ultrasound. At the end of 12 weeks, all the baby's organs will be formed. Now that you are pregnant, you will want to do everything you can to have a healthy baby. Two of the most important things are to get good prenatal care and to follow your health care provider's instructions. Prenatal care is all the medical care you receive before the baby's birth. This care will help prevent, find, and treat any problems during the pregnancy and childbirth. Body changes during your first trimester Your body goes through many changes during pregnancy. The changes vary from woman to woman.  You may gain or lose a couple of pounds at first.  You may feel sick to your stomach (nauseous) and you may throw up (vomit). If the vomiting is uncontrollable, call your health care provider.  You may tire easily.  You may develop headaches that can be relieved by medicines. All medicines should be approved by your health care provider.  You may urinate more often. Painful urination may mean you have a bladder infection.  You may develop heartburn as a result of your pregnancy.  You may develop constipation because certain hormones are causing the muscles that push stool through your intestines to slow down.  You may develop hemorrhoids or swollen veins (varicose veins).  Your breasts may begin to grow larger and become tender. Your nipples may stick out more, and the tissue that surrounds them (areola) may become darker.  Your gums may bleed and may be  sensitive to brushing and flossing.  Dark spots or blotches (chloasma, mask of pregnancy) may develop on your face. This will likely fade after the baby is born.  Your menstrual periods will stop.  You may have a loss of appetite.  You may develop cravings for certain kinds of food.  You may have changes in your emotions from day to day, such as being excited to be pregnant or being concerned that something may go wrong with the pregnancy and baby.  You may have more vivid and strange dreams.  You may have changes in your hair. These can include thickening of your hair, rapid growth, and changes in texture. Some women also have hair loss during or after pregnancy, or hair that feels dry or thin. Your hair will most likely return to normal after your baby is born.  What to expect at prenatal visits During a routine prenatal visit:  You will be weighed to make sure you and the baby are growing normally.  Your blood pressure will be taken.  Your abdomen will be measured to track your baby's growth.  The fetal heartbeat will be listened to between weeks 10 and 14 of your pregnancy.  Test results from any previous visits will be discussed.  Your health care provider may ask you:  How you are feeling.  If you are feeling the baby move.  If you have had any abnormal symptoms, such as leaking fluid, bleeding, severe headaches,   or abdominal cramping.  If you are using any tobacco products, including cigarettes, chewing tobacco, and electronic cigarettes.  If you have any questions.  Other tests that may be performed during your first trimester include:  Blood tests to find your blood type and to check for the presence of any previous infections. The tests will also be used to check for low iron levels (anemia) and protein on red blood cells (Rh antibodies). Depending on your risk factors, or if you previously had diabetes during pregnancy, you may have tests to check for high blood  sugar that affects pregnant women (gestational diabetes).  Urine tests to check for infections, diabetes, or protein in the urine.  An ultrasound to confirm the proper growth and development of the baby.  Fetal screens for spinal cord problems (spina bifida) and Down syndrome.  HIV (human immunodeficiency virus) testing. Routine prenatal testing includes screening for HIV, unless you choose not to have this test.  You may need other tests to make sure you and the baby are doing well.  Follow these instructions at home: Medicines  Follow your health care provider's instructions regarding medicine use. Specific medicines may be either safe or unsafe to take during pregnancy.  Take a prenatal vitamin that contains at least 600 micrograms (mcg) of folic acid.  If you develop constipation, try taking a stool softener if your health care provider approves. Eating and drinking  Eat a balanced diet that includes fresh fruits and vegetables, whole grains, good sources of protein such as meat, eggs, or tofu, and low-fat dairy. Your health care provider will help you determine the amount of weight gain that is right for you.  Avoid raw meat and uncooked cheese. These carry germs that can cause birth defects in the baby.  Eating four or five small meals rather than three large meals a day may help relieve nausea and vomiting. If you start to feel nauseous, eating a few soda crackers can be helpful. Drinking liquids between meals, instead of during meals, also seems to help ease nausea and vomiting.  Limit foods that are high in fat and processed sugars, such as fried and sweet foods.  To prevent constipation: ? Eat foods that are high in fiber, such as fresh fruits and vegetables, whole grains, and beans. ? Drink enough fluid to keep your urine clear or pale yellow. Activity  Exercise only as directed by your health care provider. Most women can continue their usual exercise routine during  pregnancy. Try to exercise for 30 minutes at least 5 days a week. Exercising will help you: ? Control your weight. ? Stay in shape. ? Be prepared for labor and delivery.  Experiencing pain or cramping in the lower abdomen or lower back is a good sign that you should stop exercising. Check with your health care provider before continuing with normal exercises.  Try to avoid standing for long periods of time. Move your legs often if you must stand in one place for a long time.  Avoid heavy lifting.  Wear low-heeled shoes and practice good posture.  You may continue to have sex unless your health care provider tells you not to. Relieving pain and discomfort  Wear a good support bra to relieve breast tenderness.  Take warm sitz baths to soothe any pain or discomfort caused by hemorrhoids. Use hemorrhoid cream if your health care provider approves.  Rest with your legs elevated if you have leg cramps or low back pain.  If you develop   varicose veins in your legs, wear support hose. Elevate your feet for 15 minutes, 3-4 times a day. Limit salt in your diet. Prenatal care  Schedule your prenatal visits by the twelfth week of pregnancy. They are usually scheduled monthly at first, then more often in the last 2 months before delivery.  Write down your questions. Take them to your prenatal visits.  Keep all your prenatal visits as told by your health care provider. This is important. Safety  Wear your seat belt at all times when driving.  Make a list of emergency phone numbers, including numbers for family, friends, the hospital, and police and fire departments. General instructions  Ask your health care provider for a referral to a local prenatal education class. Begin classes no later than the beginning of month 6 of your pregnancy.  Ask for help if you have counseling or nutritional needs during pregnancy. Your health care provider can offer advice or refer you to specialists for help  with various needs.  Do not use hot tubs, steam rooms, or saunas.  Do not douche or use tampons or scented sanitary pads.  Do not cross your legs for long periods of time.  Avoid cat litter boxes and soil used by cats. These carry germs that can cause birth defects in the baby and possibly loss of the fetus by miscarriage or stillbirth.  Avoid all smoking, herbs, alcohol, and medicines not prescribed by your health care provider. Chemicals in these products affect the formation and growth of the baby.  Do not use any products that contain nicotine or tobacco, such as cigarettes and e-cigarettes. If you need help quitting, ask your health care provider. You may receive counseling support and other resources to help you quit.  Schedule a dentist appointment. At home, brush your teeth with a soft toothbrush and be gentle when you floss. Contact a health care provider if:  You have dizziness.  You have mild pelvic cramps, pelvic pressure, or nagging pain in the abdominal area.  You have persistent nausea, vomiting, or diarrhea.  You have a bad smelling vaginal discharge.  You have pain when you urinate.  You notice increased swelling in your face, hands, legs, or ankles.  You are exposed to fifth disease or chickenpox.  You are exposed to German measles (rubella) and have never had it. Get help right away if:  You have a fever.  You are leaking fluid from your vagina.  You have spotting or bleeding from your vagina.  You have severe abdominal cramping or pain.  You have rapid weight gain or loss.  You vomit blood or material that looks like coffee grounds.  You develop a severe headache.  You have shortness of breath.  You have any kind of trauma, such as from a fall or a car accident. Summary  The first trimester of pregnancy is from week 1 until the end of week 13 (months 1 through 3).  Your body goes through many changes during pregnancy. The changes vary from  woman to woman.  You will have routine prenatal visits. During those visits, your health care provider will examine you, discuss any test results you may have, and talk with you about how you are feeling. This information is not intended to replace advice given to you by your health care provider. Make sure you discuss any questions you have with your health care provider. Document Released: 09/20/2001 Document Revised: 09/07/2016 Document Reviewed: 09/07/2016 Elsevier Interactive Patient Education  2017 [employer]   [employer]  

## 2017-05-12 NOTE — Progress Notes (Addendum)
OB US for dating and viability scheduled for August 17th @ 0800.  First Trimester Screen scheduled for September 12th @ 0800.  Pt notified.  I reviewed the patient history and discussed her Lovenox therapy. Dr Vincenza HewsQuinn consulted and prophylactic dose was recommended during her last pregnancy. Lovenox 40 mg [place] daily was ordered. She has tested negative for genetic or acquired thrombophylia.  Adam PhenixArnold, James G, MD

## 2017-05-14 LAB — CULTURE, OB URINE

## 2017-05-14 LAB — URINE CULTURE, OB REFLEX

## 2017-05-15 LAB — OBSTETRIC PANEL, INCLUDING HIV
Antibody Screen: NEGATIVE
Basophils Absolute: 0 10*3/uL (ref 0.0–0.2)
Basos: 0 %
EOS (ABSOLUTE): 0.1 10*3/uL (ref 0.0–0.4)
Eos: 2 %
HIV Screen 4th Generation wRfx: NONREACTIVE
Hematocrit: 36.4 % (ref 34.0–46.6)
Hemoglobin: 12 g/dL (ref 11.1–15.9)
Hepatitis B Surface Ag: NEGATIVE
Immature Grans (Abs): 0 10*3/uL (ref 0.0–0.1)
Immature Granulocytes: 0 %
Lymphocytes Absolute: 1.8 10*3/uL (ref 0.7–3.1)
Lymphs: 28 %
MCH: 27.5 pg (ref 26.6–33.0)
MCHC: 33 g/dL (ref 31.5–35.7)
MCV: 83 fL (ref 79–97)
Monocytes Absolute: 0.2 10*3/uL (ref 0.1–0.9)
Monocytes: 4 %
Neutrophils Absolute: 4.3 10*3/uL (ref 1.4–7.0)
Neutrophils: 66 %
Platelets: 161 10*3/uL (ref 150–379)
RBC: 4.37 x10E6/uL (ref 3.77–5.28)
RDW: 14.3 % (ref 12.3–15.4)
RPR Ser Ql: NONREACTIVE
Rh Factor: NEGATIVE
Rubella Antibodies, IGG: 4.14 index (ref >0.99)
WBC: 6.5 10*3/uL (ref 3.4–10.8)

## 2017-05-15 LAB — HEMOGLOBINOPATHY EVALUATION
HGB C: 0 %
HGB S: 0 %
HGB VARIANT: 0 %
Hemoglobin A2 Quantitation: 2.5 % (ref 1.8–3.2)
Hemoglobin F Quantitation: 0 % (ref 0.0–2.0)
Hgb A: 97.5 % (ref 96.4–98.8)

## 2017-05-15 LAB — HEMOGLOBIN A1C
Est. average glucose Bld gHb Est-mCnc: 103 mg/dL
Hgb A1c MFr Bld: 5.2 % (ref 4.8–5.6)

## 2017-05-16 ENCOUNTER — Encounter: Payer: Self-pay | Admitting: Family Medicine

## 2017-05-16 DIAGNOSIS — Z6791 Unspecified blood type, Rh negative: Secondary | ICD-10-CM | POA: Insufficient documentation

## 2017-05-16 DIAGNOSIS — O26899 Other specified pregnancy related conditions, unspecified trimester: Secondary | ICD-10-CM | POA: Insufficient documentation

## 2017-05-16 DIAGNOSIS — O0990 Supervision of high risk pregnancy, unspecified, unspecified trimester: Secondary | ICD-10-CM | POA: Insufficient documentation

## 2017-05-26 ENCOUNTER — Ambulatory Visit: Payer: Medicaid Other

## 2017-05-26 ENCOUNTER — Ambulatory Visit (HOSPITAL_COMMUNITY)
Admission: RE | Admit: 2017-05-26 | Discharge: 2017-05-26 | Disposition: A | Discharge status: Home / Self Care | Payer: Medicaid Other | Source: Ambulatory Visit | Attending: Obstetrics & Gynecology | Admitting: Obstetrics & Gynecology

## 2017-05-26 DIAGNOSIS — I2699 Other pulmonary embolism without acute cor pulmonale: Secondary | ICD-10-CM | POA: Diagnosis not present

## 2017-05-26 DIAGNOSIS — O99280 Endocrine, nutritional and metabolic diseases complicating pregnancy, unspecified trimester: Secondary | ICD-10-CM

## 2017-05-26 DIAGNOSIS — E039 Hypothyroidism, unspecified: Secondary | ICD-10-CM | POA: Diagnosis not present

## 2017-05-26 DIAGNOSIS — O99281 Endocrine, nutritional and metabolic diseases complicating pregnancy, first trimester: Secondary | ICD-10-CM | POA: Insufficient documentation

## 2017-05-26 DIAGNOSIS — Z3A08 8 weeks gestation of pregnancy: Secondary | ICD-10-CM | POA: Diagnosis not present

## 2017-05-26 DIAGNOSIS — Z712 Person consulting for explanation of examination or test findings: Secondary | ICD-10-CM

## 2017-05-26 NOTE — Progress Notes (Signed)
Patient presented to the office for U/S results. Patient is 8 w 1 d EDD 01/04/17. Patient verbalized understanding and had no questions.

## 2017-06-06 ENCOUNTER — Encounter: Payer: Self-pay | Admitting: Obstetrics & Gynecology

## 2017-06-16 ENCOUNTER — Encounter: Payer: Self-pay | Admitting: Obstetrics & Gynecology

## 2017-06-16 ENCOUNTER — Other Ambulatory Visit (HOSPITAL_COMMUNITY)
Admission: RE | Admit: 2017-06-16 | Discharge: 2017-06-16 | Disposition: A | Discharge status: Home / Self Care | Payer: Medicaid Other | Source: Ambulatory Visit | Attending: Obstetrics & Gynecology | Admitting: Obstetrics & Gynecology

## 2017-06-16 ENCOUNTER — Ambulatory Visit (INDEPENDENT_AMBULATORY_CARE_PROVIDER_SITE_OTHER): Payer: Medicaid Other | Admitting: Obstetrics & Gynecology

## 2017-06-16 VITALS — BP 118/71 | HR 66 | Wt 209.6 lb

## 2017-06-16 DIAGNOSIS — F329 Major depressive disorder, single episode, unspecified: Secondary | ICD-10-CM | POA: Insufficient documentation

## 2017-06-16 DIAGNOSIS — O99341 Other mental disorders complicating pregnancy, first trimester: Secondary | ICD-10-CM

## 2017-06-16 DIAGNOSIS — O34219 Maternal care for unspecified type scar from previous cesarean delivery: Secondary | ICD-10-CM | POA: Diagnosis not present

## 2017-06-16 DIAGNOSIS — O0991 Supervision of high risk pregnancy, unspecified, first trimester: Secondary | ICD-10-CM

## 2017-06-16 DIAGNOSIS — Z8759 Personal history of other complications of pregnancy, childbirth and the puerperium: Secondary | ICD-10-CM

## 2017-06-16 DIAGNOSIS — O0990 Supervision of high risk pregnancy, unspecified, unspecified trimester: Secondary | ICD-10-CM | POA: Insufficient documentation

## 2017-06-16 DIAGNOSIS — O10911 Unspecified pre-existing hypertension complicating pregnancy, first trimester: Secondary | ICD-10-CM | POA: Diagnosis not present

## 2017-06-16 DIAGNOSIS — O10919 Unspecified pre-existing hypertension complicating pregnancy, unspecified trimester: Secondary | ICD-10-CM

## 2017-06-16 DIAGNOSIS — O99340 Other mental disorders complicating pregnancy, unspecified trimester: Secondary | ICD-10-CM | POA: Insufficient documentation

## 2017-06-16 DIAGNOSIS — F32A Depression, unspecified: Secondary | ICD-10-CM

## 2017-06-16 LAB — POCT URINALYSIS DIP (DEVICE)
Bilirubin Urine: NEGATIVE
Glucose, UA: NEGATIVE mg/dL
Hgb urine dipstick: NEGATIVE
Ketones, ur: NEGATIVE mg/dL
Leukocytes, UA: NEGATIVE
Nitrite: NEGATIVE
Protein, ur: 30 mg/dL — AB
Specific Gravity, Urine: 1.02 (ref 1.005–1.030)
Urobilinogen, UA: 0.2 mg/dL (ref 0.0–1.0)
pH: 7.5 (ref 5.0–8.0)

## 2017-06-16 MED ORDER — VITAMIN B-6 50 MG PO TABS: 50.0000 mg | ORAL_TABLET | Freq: Two times a day (BID) | ORAL | 1 refills | Status: DC

## 2017-06-16 NOTE — Patient Instructions (Signed)
First Trimester of Pregnancy The first trimester of pregnancy is from week 1 until the end of week 13 (months 1 through 3). A week after a sperm fertilizes an egg, the egg will implant on the wall of the uterus. This embryo will begin to develop into a baby. Genes from you and your partner will form the baby. The female genes will determine whether the baby will be a boy or a girl. At 6-8 weeks, the eyes and face will be formed, and the heartbeat can be seen on ultrasound. At the end of 12 weeks, all the baby's organs will be formed. Now that you are pregnant, you will want to do everything you can to have a healthy baby. Two of the most important things are to get good prenatal care and to follow your health care provider's instructions. Prenatal care is all the medical care you receive before the baby's birth. This care will help prevent, find, and treat any problems during the pregnancy and childbirth. Body changes during your first trimester Your body goes through many changes during pregnancy. The changes vary from woman to woman.  You may gain or lose a couple of pounds at first.  You may feel sick to your stomach (nauseous) and you may throw up (vomit). If the vomiting is uncontrollable, call your health care provider.  You may tire easily.  You may develop headaches that can be relieved by medicines. All medicines should be approved by your health care provider.  You may urinate more often. Painful urination may mean you have a bladder infection.  You may develop heartburn as a result of your pregnancy.  You may develop constipation because certain hormones are causing the muscles that push stool through your intestines to slow down.  You may develop hemorrhoids or swollen veins (varicose veins).  Your breasts may begin to grow larger and become tender. Your nipples may stick out more, and the tissue that surrounds them (areola) may become darker.  Your gums may bleed and may be  sensitive to brushing and flossing.  Dark spots or blotches (chloasma, mask of pregnancy) may develop on your face. This will likely fade after the baby is born.  Your menstrual periods will stop.  You may have a loss of appetite.  You may develop cravings for certain kinds of food.  You may have changes in your emotions from day to day, such as being excited to be pregnant or being concerned that something may go wrong with the pregnancy and baby.  You may have more vivid and strange dreams.  You may have changes in your hair. These can include thickening of your hair, rapid growth, and changes in texture. Some women also have hair loss during or after pregnancy, or hair that feels dry or thin. Your hair will most likely return to normal after your baby is born.  What to expect at prenatal visits During a routine prenatal visit:  You will be weighed to make sure you and the baby are growing normally.  Your blood pressure will be taken.  Your abdomen will be measured to track your baby's growth.  The fetal heartbeat will be listened to between weeks 10 and 14 of your pregnancy.  Test results from any previous visits will be discussed.  Your health care provider may ask you:  How you are feeling.  If you are feeling the baby move.  If you have had any abnormal symptoms, such as leaking fluid, bleeding, severe headaches,   or abdominal cramping.  If you are using any tobacco products, including cigarettes, chewing tobacco, and electronic cigarettes.  If you have any questions.  Other tests that may be performed during your first trimester include:  Blood tests to find your blood type and to check for the presence of any previous infections. The tests will also be used to check for low iron levels (anemia) and protein on red blood cells (Rh antibodies). Depending on your risk factors, or if you previously had diabetes during pregnancy, you may have tests to check for high blood  sugar that affects pregnant women (gestational diabetes).  Urine tests to check for infections, diabetes, or protein in the urine.  An ultrasound to confirm the proper growth and development of the baby.  Fetal screens for spinal cord problems (spina bifida) and Down syndrome.  HIV (human immunodeficiency virus) testing. Routine prenatal testing includes screening for HIV, unless you choose not to have this test.  You may need other tests to make sure you and the baby are doing well.  Follow these instructions at home: Medicines  Follow your health care provider's instructions regarding medicine use. Specific medicines may be either safe or unsafe to take during pregnancy.  Take a prenatal vitamin that contains at least 600 micrograms (mcg) of folic acid.  If you develop constipation, try taking a stool softener if your health care provider approves. Eating and drinking  Eat a balanced diet that includes fresh fruits and vegetables, whole grains, good sources of protein such as meat, eggs, or tofu, and low-fat dairy. Your health care provider will help you determine the amount of weight gain that is right for you.  Avoid raw meat and uncooked cheese. These carry germs that can cause birth defects in the baby.  Eating four or five small meals rather than three large meals a day may help relieve nausea and vomiting. If you start to feel nauseous, eating a few soda crackers can be helpful. Drinking liquids between meals, instead of during meals, also seems to help ease nausea and vomiting.  Limit foods that are high in fat and processed sugars, such as fried and sweet foods.  To prevent constipation: ? Eat foods that are high in fiber, such as fresh fruits and vegetables, whole grains, and beans. ? Drink enough fluid to keep your urine clear or pale yellow. Activity  Exercise only as directed by your health care provider. Most women can continue their usual exercise routine during  pregnancy. Try to exercise for 30 minutes at least 5 days a week. Exercising will help you: ? Control your weight. ? Stay in shape. ? Be prepared for labor and delivery.  Experiencing pain or cramping in the lower abdomen or lower back is a good sign that you should stop exercising. Check with your health care provider before continuing with normal exercises.  Try to avoid standing for long periods of time. Move your legs often if you must stand in one place for a long time.  Avoid heavy lifting.  Wear low-heeled shoes and practice good posture.  You may continue to have sex unless your health care provider tells you not to. Relieving pain and discomfort  Wear a good support bra to relieve breast tenderness.  Take warm sitz baths to soothe any pain or discomfort caused by hemorrhoids. Use hemorrhoid cream if your health care provider approves.  Rest with your legs elevated if you have leg cramps or low back pain.  If you develop   varicose veins in your legs, wear support hose. Elevate your feet for 15 minutes, 3-4 times a day. Limit salt in your diet. Prenatal care  Schedule your prenatal visits by the twelfth week of pregnancy. They are usually scheduled monthly at first, then more often in the last 2 months before delivery.  Write down your questions. Take them to your prenatal visits.  Keep all your prenatal visits as told by your health care provider. This is important. Safety  Wear your seat belt at all times when driving.  Make a list of emergency phone numbers, including numbers for family, friends, the hospital, and police and fire departments. General instructions  Ask your health care provider for a referral to a local prenatal education class. Begin classes no later than the beginning of month 6 of your pregnancy.  Ask for help if you have counseling or nutritional needs during pregnancy. Your health care provider can offer advice or refer you to specialists for help  with various needs.  Do not use hot tubs, steam rooms, or saunas.  Do not douche or use tampons or scented sanitary pads.  Do not cross your legs for long periods of time.  Avoid cat litter boxes and soil used by cats. These carry germs that can cause birth defects in the baby and possibly loss of the fetus by miscarriage or stillbirth.  Avoid all smoking, herbs, alcohol, and medicines not prescribed by your health care provider. Chemicals in these products affect the formation and growth of the baby.  Do not use any products that contain nicotine or tobacco, such as cigarettes and e-cigarettes. If you need help quitting, ask your health care provider. You may receive counseling support and other resources to help you quit.  Schedule a dentist appointment. At home, brush your teeth with a soft toothbrush and be gentle when you floss. Contact a health care provider if:  You have dizziness.  You have mild pelvic cramps, pelvic pressure, or nagging pain in the abdominal area.  You have persistent nausea, vomiting, or diarrhea.  You have a bad smelling vaginal discharge.  You have pain when you urinate.  You notice increased swelling in your face, hands, legs, or ankles.  You are exposed to fifth disease or chickenpox.  You are exposed to German measles (rubella) and have never had it. Get help right away if:  You have a fever.  You are leaking fluid from your vagina.  You have spotting or bleeding from your vagina.  You have severe abdominal cramping or pain.  You have rapid weight gain or loss.  You vomit blood or material that looks like coffee grounds.  You develop a severe headache.  You have shortness of breath.  You have any kind of trauma, such as from a fall or a car accident. Summary  The first trimester of pregnancy is from week 1 until the end of week 13 (months 1 through 3).  Your body goes through many changes during pregnancy. The changes vary from  woman to woman.  You will have routine prenatal visits. During those visits, your health care provider will examine you, discuss any test results you may have, and talk with you about how you are feeling. This information is not intended to replace advice given to you by your health care provider. Make sure you discuss any questions you have with your health care provider. Document Released: 09/20/2001 Document Revised: 09/07/2016 Document Reviewed: 09/07/2016 Elsevier Interactive Patient Education  2017 [employer]   [employer]  

## 2017-06-16 NOTE — Progress Notes (Signed)
   PRENATAL VISIT NOTE  Subjective:  Morgan Nielsen is a 31 y.o. G2P1001 at 404w5d being seen today for ongoing prenatal care.  She is currently monitored for the following issues for this high-risk pregnancy and has Pulmonary embolism (HCC); Religious or spiritual beliefs affecting medical care; Hypothyroidism; Obese; Family history of blood clots; Family history of blood disorder; Chronic hypertension in pregnancy; Supervision of high risk pregnancy, antepartum; Rh negative state in antepartum period; History of cesarean section complicating pregnancy; and Depression affecting pregnancy on her problem list.  Patient reports no complaints.  Contractions: Not present. Vag. Bleeding: None.  Movement: Absent. Denies leaking of fluid.  The following portions of the patient's history were reviewed and updated as appropriate: allergies, current medications, past family history, past medical history, past social history, past surgical history and problem list. Problem list updated.  Objective:   Vitals:   06/16/17 0952  BP: 118/71  Pulse: 66  Weight: 209 lb 9.6 oz (95.1 kg)    Fetal Status:     Movement: Absent     General:  Alert, oriented and cooperative. Patient is in no acute distress.  Skin: Skin is warm and dry. No rash noted.   Cardiovascular: Normal heart rate noted  Respiratory: Normal respiratory effort, no problems with respiration noted  Breast:  Abdomen: Breasts: breasts appear normal, no suspicious masses, no skin or nipple changes or axillary nodes. Soft, gravid, appropriate for gestational age. Pain/Pressure: Absent     Pelvic:  Pelvic exam: normal external genitalia, vulva, vagina, cervix, uterus and adnexa, no CMT.         Extremities: Normal range of motion.     Mental Status: Normal mood and affect. Normal behavior. Normal judgment and thought content.   Assessment and Plan:  Pregnancy: G2P1001 at 634w5d  1. Supervision of high risk pregnancy, antepartum - Pap &  pelvic exam performed - G/C collected - Obstetric Panel, including HIV previously collected - First trimester screen scheduled for 9/14 - Plans on breastfeeding - Continue PNV   2. Chronic hypertension in pregnancy - ASA 81mg  daily  3. History of pulmonary embolism  - Lovenox 40 mg daily   4. Rh negative state in antepartum period  - Pt is Jehovah's Witness, but willing to receive RhoGAM - Received RhoGAM during previous pregnancy  - FOB desires testing  5. Hypothyroidism in pregnancy - Synthroid 100mcg daily  6. History of prior c-section - Indicated for breech presentation - Unsure about TOLAC   7. Depression affecting pregnancy  - Continue Lexapro, pt counseled on risks and benefits of treatment  8. Nausea and vomiting of pregnancy - Vit B6 daily  Preterm labor symptoms and miscarriage precautions and general obstetric precautions including but not limited to vaginal bleeding, abdominal pain, contractions, leaking of fluid and fetal movement were reviewed in detail with the patient. Please refer to After Visit Summary for other counseling recommendations.  Follow up with [facility] in [place].  Dannette Barbararew Paytan Recine, Medical Student  Attestation of Attending Supervision of Medical Student: Evaluation and management procedures were performed by the medical student under my supervision and collaboration.  I have reviewed the student's note and chart, and I agree with the management and plan.  Scheryl DarterJAMES ARNOLD, MD, FACOG Attending Obstetrician & Gynecologist Faculty Practice, [facility] [place]

## 2017-06-16 NOTE — Assessment & Plan Note (Signed)
Continue Lexapro

## 2017-06-19 LAB — CYTOLOGY - PAP
Diagnosis: NEGATIVE
HPV: NOT DETECTED

## 2017-06-21 ENCOUNTER — Ambulatory Visit (HOSPITAL_COMMUNITY): Payer: Medicaid Other

## 2017-06-23 ENCOUNTER — Ambulatory Visit (HOSPITAL_COMMUNITY): Payer: Medicaid Other

## 2017-06-23 ENCOUNTER — Encounter (HOSPITAL_COMMUNITY): Payer: Self-pay

## 2017-06-23 ENCOUNTER — Ambulatory Visit (HOSPITAL_COMMUNITY)
Admission: RE | Admit: 2017-06-23 | Discharge: 2017-06-23 | Disposition: A | Discharge status: Home / Self Care | Payer: Medicaid Other | Source: Ambulatory Visit | Attending: Obstetrics & Gynecology | Admitting: Obstetrics & Gynecology

## 2017-06-23 ENCOUNTER — Encounter: Payer: Self-pay | Admitting: Obstetrics and Gynecology

## 2017-06-23 DIAGNOSIS — O99281 Endocrine, nutritional and metabolic diseases complicating pregnancy, first trimester: Secondary | ICD-10-CM | POA: Insufficient documentation

## 2017-06-23 DIAGNOSIS — Z3A12 12 weeks gestation of pregnancy: Secondary | ICD-10-CM | POA: Insufficient documentation

## 2017-06-23 DIAGNOSIS — O0990 Supervision of high risk pregnancy, unspecified, unspecified trimester: Secondary | ICD-10-CM

## 2017-06-23 DIAGNOSIS — Z9884 Bariatric surgery status: Secondary | ICD-10-CM | POA: Insufficient documentation

## 2017-06-23 DIAGNOSIS — E039 Hypothyroidism, unspecified: Secondary | ICD-10-CM | POA: Insufficient documentation

## 2017-06-23 DIAGNOSIS — O99280 Endocrine, nutritional and metabolic diseases complicating pregnancy, unspecified trimester: Secondary | ICD-10-CM

## 2017-06-23 DIAGNOSIS — I2699 Other pulmonary embolism without acute cor pulmonale: Secondary | ICD-10-CM

## 2017-06-23 DIAGNOSIS — O88811 Other embolism in pregnancy, first trimester: Secondary | ICD-10-CM | POA: Diagnosis not present

## 2017-06-23 HISTORY — DX: Gestational (pregnancy-induced) hypertension without significant proteinuria, unspecified trimester: O13.9

## 2017-07-03 ENCOUNTER — Encounter: Payer: Self-pay | Admitting: Obstetrics & Gynecology

## 2017-07-03 ENCOUNTER — Ambulatory Visit (INDEPENDENT_AMBULATORY_CARE_PROVIDER_SITE_OTHER): Payer: Commercial Managed Care - PPO | Admitting: Obstetrics & Gynecology

## 2017-07-03 VITALS — BP 130/80 | HR 72 | Wt 215.0 lb

## 2017-07-03 DIAGNOSIS — Z331 Pregnant state, incidental: Secondary | ICD-10-CM

## 2017-07-03 DIAGNOSIS — O99412 Diseases of the circulatory system complicating pregnancy, second trimester: Secondary | ICD-10-CM

## 2017-07-03 DIAGNOSIS — O0990 Supervision of high risk pregnancy, unspecified, unspecified trimester: Secondary | ICD-10-CM

## 2017-07-03 DIAGNOSIS — Z3A14 14 weeks gestation of pregnancy: Secondary | ICD-10-CM

## 2017-07-03 DIAGNOSIS — Z1389 Encounter for screening for other disorder: Secondary | ICD-10-CM

## 2017-07-03 DIAGNOSIS — I2699 Other pulmonary embolism without acute cor pulmonale: Secondary | ICD-10-CM

## 2017-07-03 LAB — POCT URINALYSIS DIPSTICK
Blood, UA: NEGATIVE
Glucose, UA: NEGATIVE
Ketones, UA: NEGATIVE
Leukocytes, UA: NEGATIVE
Nitrite, UA: NEGATIVE
Protein, UA: NEGATIVE

## 2017-07-03 NOTE — Progress Notes (Signed)
G2P1001 [redacted]w[redacted]d Estimated Date of Delivery: 12/31/17  Blood pressure 130/80, pulse 72, weight 215 lb (97.5 kg), last menstrual period 03/26/2017, unknown if currently breastfeeding.   BP weight and urine results all reviewed and noted.  Please refer to the obstetrical flow sheet for the fundal height and fetal heart rate documentation:  Patient reports good fetal movement, denies any bleeding and no rupture of membranes symptoms or regular contractions. Patient is without complaints. All questions were answered.  Orders Placed This Encounter  Procedures  . US OB Comp + 14 Wk  . POCT urinalysis dipstick    Plan:  Continued routine obstetrical care,   Return in about 4 weeks (around 07/31/2017) for 20 week sono, HROB.

## 2017-08-03 ENCOUNTER — Encounter: Payer: Commercial Managed Care - PPO | Admitting: Obstetrics & Gynecology

## 2017-08-03 ENCOUNTER — Encounter: Payer: Commercial Managed Care - PPO | Admitting: Advanced Practice Midwife

## 2017-08-03 ENCOUNTER — Ambulatory Visit (INDEPENDENT_AMBULATORY_CARE_PROVIDER_SITE_OTHER): Payer: Commercial Managed Care - PPO | Admitting: Obstetrics & Gynecology

## 2017-08-03 ENCOUNTER — Encounter: Payer: Self-pay | Admitting: Obstetrics & Gynecology

## 2017-08-03 ENCOUNTER — Ambulatory Visit (INDEPENDENT_AMBULATORY_CARE_PROVIDER_SITE_OTHER): Payer: Commercial Managed Care - PPO

## 2017-08-03 VITALS — BP 120/80 | HR 90 | Wt 219.0 lb

## 2017-08-03 DIAGNOSIS — E039 Hypothyroidism, unspecified: Secondary | ICD-10-CM

## 2017-08-03 DIAGNOSIS — O10912 Unspecified pre-existing hypertension complicating pregnancy, second trimester: Secondary | ICD-10-CM | POA: Diagnosis not present

## 2017-08-03 DIAGNOSIS — Z3A18 18 weeks gestation of pregnancy: Secondary | ICD-10-CM

## 2017-08-03 DIAGNOSIS — O99282 Endocrine, nutritional and metabolic diseases complicating pregnancy, second trimester: Secondary | ICD-10-CM | POA: Diagnosis not present

## 2017-08-03 DIAGNOSIS — O26899 Other specified pregnancy related conditions, unspecified trimester: Secondary | ICD-10-CM

## 2017-08-03 DIAGNOSIS — I2699 Other pulmonary embolism without acute cor pulmonale: Secondary | ICD-10-CM

## 2017-08-03 DIAGNOSIS — O99412 Diseases of the circulatory system complicating pregnancy, second trimester: Secondary | ICD-10-CM

## 2017-08-03 DIAGNOSIS — O0990 Supervision of high risk pregnancy, unspecified, unspecified trimester: Secondary | ICD-10-CM

## 2017-08-03 DIAGNOSIS — O99280 Endocrine, nutritional and metabolic diseases complicating pregnancy, unspecified trimester: Secondary | ICD-10-CM

## 2017-08-03 DIAGNOSIS — O34219 Maternal care for unspecified type scar from previous cesarean delivery: Secondary | ICD-10-CM

## 2017-08-03 DIAGNOSIS — Z1389 Encounter for screening for other disorder: Secondary | ICD-10-CM

## 2017-08-03 DIAGNOSIS — Z6791 Unspecified blood type, Rh negative: Secondary | ICD-10-CM

## 2017-08-03 DIAGNOSIS — Z331 Pregnant state, incidental: Secondary | ICD-10-CM

## 2017-08-03 LAB — POCT URINALYSIS DIPSTICK
Blood, UA: NEGATIVE
Glucose, UA: NEGATIVE
Ketones, UA: NEGATIVE
Leukocytes, UA: NEGATIVE
Nitrite, UA: NEGATIVE
Protein, UA: NEGATIVE

## 2017-08-03 MED ORDER — ESCITALOPRAM OXALATE 10 MG PO TABS: 10.0000 mg | ORAL_TABLET | Freq: Every day | ORAL | 11 refills | Status: DC

## 2017-08-03 NOTE — Progress Notes (Signed)
US 18+4 wks,breech,cx 3.7 cm,post pl gr 0,normal ovaries bilat,svp of fluid 4.6 cm,fhr 157 bpm,EFW 251 g,anatomy complete,no obvious abnormalities

## 2017-08-03 NOTE — Progress Notes (Signed)
  G2P1001 11052w4d Estimated Date of Delivery: 12/31/17  Blood pressure 120/80, pulse 90, weight 219 lb (99.3 kg), last menstrual period 03/26/2017, unknown if currently breastfeeding.   BP weight and urine results all reviewed and noted.  Please refer to the obstetrical flow sheet for the fundal height and fetal heart rate documentation:  Patient reports good fetal movement, denies any bleeding and no rupture of membranes symptoms or regular contractions. Patient is without complaints. All questions were answered.  Orders Placed This Encounter  Procedures  . POCT urinalysis dipstick    Plan:  Continued routine obstetrical care, sonogram is normal Continue lovenox 40 daily Previous C section  Return in about 4 weeks (around 08/31/2017) for HROB.

## 2017-09-07 ENCOUNTER — Encounter: Payer: Self-pay | Admitting: Obstetrics and Gynecology

## 2017-09-07 ENCOUNTER — Ambulatory Visit (INDEPENDENT_AMBULATORY_CARE_PROVIDER_SITE_OTHER): Payer: Commercial Managed Care - PPO | Admitting: Obstetrics and Gynecology

## 2017-09-07 ENCOUNTER — Other Ambulatory Visit: Payer: Self-pay

## 2017-09-07 VITALS — BP 106/70 | HR 71 | Wt 230.0 lb

## 2017-09-07 DIAGNOSIS — O360120 Maternal care for anti-D [Rh] antibodies, second trimester, not applicable or unspecified: Secondary | ICD-10-CM

## 2017-09-07 DIAGNOSIS — E039 Hypothyroidism, unspecified: Secondary | ICD-10-CM

## 2017-09-07 DIAGNOSIS — Z86711 Personal history of pulmonary embolism: Secondary | ICD-10-CM

## 2017-09-07 DIAGNOSIS — Z3A23 23 weeks gestation of pregnancy: Secondary | ICD-10-CM

## 2017-09-07 DIAGNOSIS — Z8249 Family history of ischemic heart disease and other diseases of the circulatory system: Secondary | ICD-10-CM

## 2017-09-07 DIAGNOSIS — Z1389 Encounter for screening for other disorder: Secondary | ICD-10-CM

## 2017-09-07 DIAGNOSIS — O34219 Maternal care for unspecified type scar from previous cesarean delivery: Secondary | ICD-10-CM

## 2017-09-07 DIAGNOSIS — O0992 Supervision of high risk pregnancy, unspecified, second trimester: Secondary | ICD-10-CM

## 2017-09-07 DIAGNOSIS — Z9884 Bariatric surgery status: Secondary | ICD-10-CM

## 2017-09-07 DIAGNOSIS — O99342 Other mental disorders complicating pregnancy, second trimester: Secondary | ICD-10-CM

## 2017-09-07 DIAGNOSIS — O132 Gestational [pregnancy-induced] hypertension without significant proteinuria, second trimester: Secondary | ICD-10-CM

## 2017-09-07 DIAGNOSIS — O99282 Endocrine, nutritional and metabolic diseases complicating pregnancy, second trimester: Secondary | ICD-10-CM

## 2017-09-07 DIAGNOSIS — Z331 Pregnant state, incidental: Secondary | ICD-10-CM

## 2017-09-07 LAB — POCT URINALYSIS DIPSTICK
Blood, UA: NEGATIVE
Glucose, UA: NEGATIVE
Ketones, UA: NEGATIVE
Leukocytes, UA: NEGATIVE
Nitrite, UA: NEGATIVE
Protein, UA: NEGATIVE

## 2017-09-07 NOTE — Progress Notes (Signed)
Morgan Nielsen is a 31 y.o. female High Risk Pregnancy HROB Diagnosis(es):  Hx of pulmonary embolism (HCC),  Gestational HTN,  Rh negative state in antepartum period,  hypothyroidism in pregnancy,  hx of prior c-section,  Jehovah's Witness    G2P1001 3967w4d Estimated Date of Delivery: 12/31/17    HPI: The patient is being seen today for ongoing management of as above.  In discussion today was revealed that the patient was on therapeutic Lovenox with the last pregnancy, but a review of the medical recommendations indicate that current therapy with prophylactic Lovenox 40 mg subcu daily is appropriate Today she reports no complaints. She has experienced some sharp pains, and has questions about false contractions. Pt has been using the withdrawal and natural family planning method for birth control.  Patient reports good fetal movement, denies any bleeding and no rupture of membranes symptoms or regular contractions.   BP weight and urine results reviewed and noted. Blood pressure 106/70, pulse 71, weight 230 lb (104.3 kg), last menstrual period 03/26/2017, unknown if currently breastfeeding.  Fundal Height:  154 Fetal Heart rate:  27 Physical Examination: Abdomen - soft, nontender, nondistended, no masses or organomegaly                                     Pelvic - Not indicated                                     Edema:  None  Urinalysis:NEGATIVE  Fetal Surveillance Testing today:  Doppler  Lab and sonogram results have been reviewed.  Assessment:  1.  Pregnancy at 6467w4d,  G2P1001   :  Estimated Date of Delivery: 12/31/17                        2.  Hx of pulmonary embolism    -Lovenox 40 mg daily   3. GHTN    -ASA 81mg  daily   4. Rh negative state in antepartum period    -Pt is Jehovah's Witness, but willing to receive RhoGAM    -Received RhoGAM during previous pregnancy     - FOB desires testing   5. Chronic hypertension in pregnancy    - ASA 81mg  daily       6.  Hypothyroidism in pregnancy    - Synthroid 100mcg daily   7. History of prior c-section    - Unsure about TOLAC   7. Depression affecting pregnancy     - Continue Lexapro                       Medication(s) Plans:  Lovenox 40 daily, ASA 81 mg daily, Synthroid 100mcg daily, Lexapro  Treatment Plan: Previous C section  Follow up in 4 weeks for appointment for high risk OB care, PN2    By signing my name below, I, Izna Ahmed, attest that this documentation has been prepared under the direction and in the presence of Tilda BurrowFerguson, Dura Mccormack V, MD. Electronically Signed: Redge GainerIzna Ahmed, Medical Scribe. 09/07/17. 10:14 AM.  I personally performed the services described in this documentation, which was SCRIBED in my presence. The recorded information has been reviewed and considered accurate. It has been edited as necessary during review. Tilda BurrowJohn V Orpha Dain, MD

## 2017-10-05 ENCOUNTER — Other Ambulatory Visit: Payer: Self-pay

## 2017-10-05 ENCOUNTER — Ambulatory Visit (INDEPENDENT_AMBULATORY_CARE_PROVIDER_SITE_OTHER): Payer: Commercial Managed Care - PPO | Admitting: Advanced Practice Midwife

## 2017-10-05 ENCOUNTER — Other Ambulatory Visit: Payer: Commercial Managed Care - PPO

## 2017-10-05 ENCOUNTER — Encounter: Payer: Self-pay | Admitting: Advanced Practice Midwife

## 2017-10-05 VITALS — BP 118/78 | HR 86 | Wt 230.0 lb

## 2017-10-05 DIAGNOSIS — E039 Hypothyroidism, unspecified: Secondary | ICD-10-CM

## 2017-10-05 DIAGNOSIS — I2699 Other pulmonary embolism without acute cor pulmonale: Secondary | ICD-10-CM

## 2017-10-05 DIAGNOSIS — O99282 Endocrine, nutritional and metabolic diseases complicating pregnancy, second trimester: Secondary | ICD-10-CM

## 2017-10-05 DIAGNOSIS — O0992 Supervision of high risk pregnancy, unspecified, second trimester: Secondary | ICD-10-CM

## 2017-10-05 DIAGNOSIS — O0990 Supervision of high risk pregnancy, unspecified, unspecified trimester: Secondary | ICD-10-CM

## 2017-10-05 DIAGNOSIS — O99412 Diseases of the circulatory system complicating pregnancy, second trimester: Secondary | ICD-10-CM

## 2017-10-05 DIAGNOSIS — Z3A27 27 weeks gestation of pregnancy: Secondary | ICD-10-CM

## 2017-10-05 DIAGNOSIS — O99280 Endocrine, nutritional and metabolic diseases complicating pregnancy, unspecified trimester: Secondary | ICD-10-CM

## 2017-10-05 DIAGNOSIS — Z1389 Encounter for screening for other disorder: Secondary | ICD-10-CM

## 2017-10-05 DIAGNOSIS — Z8759 Personal history of other complications of pregnancy, childbirth and the puerperium: Secondary | ICD-10-CM

## 2017-10-05 DIAGNOSIS — Z331 Pregnant state, incidental: Secondary | ICD-10-CM

## 2017-10-05 DIAGNOSIS — Z131 Encounter for screening for diabetes mellitus: Secondary | ICD-10-CM

## 2017-10-05 LAB — POCT URINALYSIS DIPSTICK
Blood, UA: NEGATIVE
Glucose, UA: NEGATIVE
Ketones, UA: NEGATIVE
Leukocytes, UA: NEGATIVE
Nitrite, UA: NEGATIVE
Protein, UA: NEGATIVE

## 2017-10-05 NOTE — Progress Notes (Signed)
HIGH-RISK PREGNANCY VISIT Patient name: Morgan Nielsen MRN [id number]  Date of birth: Feb 21, 1986 Chief Complaint:   High Risk Gestation (PN2 labs today)  History of Present Illness:   Morgan Nielsen is a 31 y.o. 262P1001 female at 5076w4d with an Estimated Date of Delivery: 12/31/17 being seen today for ongoing management of a high-risk pregnancy complicated by Hx of PE and GHTN (not CHTN, as stated in several places in her chart) hypothyroidsim.  Today she reports no complaints. Contractions: Not present. Vag. Bleeding: None.  Movement: Present. denies leaking of fluid.  Review of Systems:   Pertinent items are noted in HPI Denies abnormal vaginal discharge w/ itching/odor/irritation, headaches, visual changes, shortness of breath, chest pain, abdominal pain, severe nausea/vomiting, or problems with urination or bowel movements unless otherwise stated above.    Pertinent History Reviewed:  Medical & Surgical Hx:   Past Medical History:  Diagnosis Date  . Allergy   . Anemia   . Asthma   . Clotting disorder (HCC)    per bloodwork not clotting disorder  . Family history of blood clots 09/13/2011  . Family history of blood disorder 09/13/2011  . Headache(784.0)    migraines  . Hypertension   . Hypothyroidism 09/13/2011  . Obese 09/13/2011  . Pregnancy induced hypertension   . Protein S deficiency (HCC) 11/08/2011  . Pulmonary embolism (HCC) 05/09/2011  . Religious or spiritual beliefs affecting medical care 09/13/2011   doesnt accept blood   Past Surgical History:  Procedure Laterality Date  . BARIATRIC SURGERY  2016  . CESAREAN SECTION N/A 12/07/2013   Procedure: CESAREAN SECTION;  Surgeon: Esmeralda ArthurSandra A Rivard, MD;  Location: [facility];  Service: Obstetrics;  Laterality: N/A;  . WISDOM TOOTH EXTRACTION    . WRIST SURGERY     left torn cartilage 2007   Family History  Problem Relation Age of Onset  . Hyperlipidemia Mother   . Hypertension Mother   . Pulmonary embolism Mother   .  Diabetes Father   . Hyperlipidemia Father   . Hypertension Father   . Cancer Maternal Grandfather        prostate  . Pulmonary embolism Maternal Aunt   . Deep vein thrombosis Maternal Uncle   . Cancer Paternal Aunt        uterine  . Pulmonary embolism Maternal Aunt   . Pulmonary embolism Maternal Aunt     Current Outpatient Medications:  .  B Complex-C (SUPER B COMPLEX PO), Take 1 tablet by mouth daily., Disp: , Rfl:  .  Calcium Carb-Cholecalciferol (CALCIUM 500 + D3 PO), Take 1 tablet by mouth daily., Disp: , Rfl:  .  enoxaparin (LOVENOX) 40 MG/0.4ML injection, Inject 0.4 mLs (40 mg total) into the skin daily., Disp: 30 Syringe, Rfl: 6 .  escitalopram (LEXAPRO) 10 MG tablet, Take 1 tablet (10 mg total) by mouth daily., Disp: 30 tablet, Rfl: 11 .  ferrous sulfate 325 (65 FE) MG tablet, Take 325 mg by mouth daily with breakfast., Disp: , Rfl:  .  levothyroxine (SYNTHROID, LEVOTHROID) 112 MCG tablet, Take 100 mcg by mouth daily before breakfast. , Disp: , Rfl:  .  Magnesium 400 MG CAPS, Take by mouth., Disp: , Rfl:  .  Melatonin 3 MG CAPS, Take by mouth., Disp: , Rfl:  .  NON FORMULARY, Take 1 tablet by mouth daily. Hair, Skin, and Nails Vitamin, Disp: , Rfl:  .  Prenatal Vit-Fe Fumarate-FA (PRENATAL MULTIVITAMIN) TABS tablet, Take 1 tablet by mouth daily at 12  noon., Disp: , Rfl:  .  pyridOXINE (VITAMIN B-6) 50 MG tablet, Take 1 tablet (50 mg total) by mouth 2 (two) times daily., Disp: 60 tablet, Rfl: 1 .  Zinc 25 MG TABS, Take 1 tablet by mouth daily., Disp: , Rfl:  .  aspirin 81 MG tablet, Take 81 mg by mouth daily., Disp: , Rfl:  Social History: Reviewed -  reports that  has never smoked. she has never used smokeless tobacco.   Physical Assessment:   Vitals:   10/05/17 0947  BP: 118/78  Pulse: 86  Weight: 230 lb (104.3 kg)  Body mass index is 33.97 kg/m.           Physical Examination:   General appearance: alert, well appearing, and in no distress  Mental status: alert,  oriented to person, place, and time  Skin: warm & dry   Extremities: Edema: None    Cardiovascular: normal heart rate noted  Respiratory: normal respiratory effort, no distress  Abdomen: gravid, soft, non-tender  Pelvic: Cervical exam deferred         Fetal Status:     Movement: Present    Fetal Surveillance Testing today: doppler  Results for orders placed or performed in visit on 10/05/17 (from the past 24 hour(s))  POCT urinalysis dipstick   Collection Time: 10/05/17  9:49 AM  Result Value Ref Range   Color, UA     Clarity, UA     Glucose, UA neg    Bilirubin, UA     Ketones, UA neg    Spec Grav, UA  1.010 - 1.025   Blood, UA neg    pH, UA  5.0 - 8.0   Protein, UA neg    Urobilinogen, UA  0.2 or 1.0 E.U./dL   Nitrite, UA neg    Leukocytes, UA Negative Negative   Appearance     Odor      Assessment & Plan:  1) High-risk pregnancy G2P1001 at 1943w4d with an Estimated Date of Delivery: 12/31/17   2) Hx GHTN, restart ASA 3) Hx PE,  4) Hypothyriodism Labs/procedures today: TSH, PN2.   Medications: ASA 81mg /lovenox 40mg  qd  Treatment Plan:  Follow-up: Return in about 3 weeks (around 10/26/2017) for  3 weeks for HROB.  Orders Placed This Encounter  Procedures  . TSH  . POCT urinalysis dipstick   CRESENZO-DISHMAN,Alegra Rost CNM 10/05/2017 10:15 AM

## 2017-10-05 NOTE — Patient Instructions (Signed)
Alphonzo CruiseJasmine S Rico, I greatly value your feedback.  If you receive a survey following your visit with us today, we appreciate you taking the time to fill it out.  Thanks, Cathie BeamsFran Cresenzo-Dishmon, CNM   Call the office [phone]) or go to [facility] if:  You begin to have strong, frequent contractions  Your water breaks.  Sometimes it is a big gush of fluid, sometimes it is just a trickle that keeps getting your panties wet or running down your legs  You have vaginal bleeding.  It is normal to have a small amount of spotting if your cervix was checked.   You don't feel your baby moving like normal.  If you don't, get you something to eat and drink and lay down and focus on feeling your baby move.  You should feel at least 10 movements in 2 hours.  If you don't, you should call the office or go to [facility].    Tdap Vaccine  It is recommended that you get the Tdap vaccine during the third trimester of EACH pregnancy to help protect your baby from getting pertussis (whooping cough)  27-36 weeks is the BEST time to do this so that you can pass the protection on to your baby. During pregnancy is better than after pregnancy, but if you are unable to get it during pregnancy it will be offered at the hospital.   You can get this vaccine at the health department or your family doctor  Everyone who will be around your baby should also be up-to-date on their vaccines. Adults (who are not pregnant) only need 1 dose of Tdap during adulthood.   Third Trimester of Pregnancy The third trimester is from week 29 through week 42, months 7 through 9. The third trimester is a time when the fetus is growing rapidly. At the end of the ninth month, the fetus is about 20 inches in length and weighs 6-10 pounds.  BODY CHANGES Your body goes through many changes during pregnancy. The changes vary from woman to woman.   Your weight will continue to increase. You can expect to gain 25-35 pounds (11-16 kg)  by the end of the pregnancy.  You may begin to get stretch marks on your hips, abdomen, and breasts.  You may urinate more often because the fetus is moving lower into your pelvis and pressing on your bladder.  You may develop or continue to have heartburn as a result of your pregnancy.  You may develop constipation because certain hormones are causing the muscles that push waste through your intestines to slow down.  You may develop hemorrhoids or swollen, bulging veins (varicose veins).  You may have pelvic pain because of the weight gain and pregnancy hormones relaxing your joints between the bones in your pelvis. Backaches may result from overexertion of the muscles supporting your posture.  You may have changes in your hair. These can include thickening of your hair, rapid growth, and changes in texture. Some women also have hair loss during or after pregnancy, or hair that feels dry or thin. Your hair will most likely return to normal after your baby is born.  Your breasts will continue to grow and be tender. A yellow discharge may leak from your breasts called colostrum.  Your belly button may stick out.  You may feel short of breath because of your expanding uterus.  You may notice the fetus "dropping," or moving lower in your abdomen.  You may have a bloody mucus  discharge. This usually occurs a few days to a week before labor begins.  Your cervix becomes thin and soft (effaced) near your due date. WHAT TO EXPECT AT YOUR PRENATAL EXAMS  You will have prenatal exams every 2 weeks until week 36. Then, you will have weekly prenatal exams. During a routine prenatal visit:  You will be weighed to make sure you and the fetus are growing normally.  Your blood pressure is taken.  Your abdomen will be measured to track your baby's growth.  The fetal heartbeat will be listened to.  Any test results from the previous visit will be discussed.  You may have a cervical check near  your due date to see if you have effaced. At around 36 weeks, your caregiver will check your cervix. At the same time, your caregiver will also perform a test on the secretions of the vaginal tissue. This test is to determine if a type of bacteria, Group B streptococcus, is present. Your caregiver will explain this further. Your caregiver may ask you:  What your birth plan is.  How you are feeling.  If you are feeling the baby move.  If you have had any abnormal symptoms, such as leaking fluid, bleeding, severe headaches, or abdominal cramping.  If you have any questions. Other tests or screenings that may be performed during your third trimester include:  Blood tests that check for low iron levels (anemia).  Fetal testing to check the health, activity level, and growth of the fetus. Testing is done if you have certain medical conditions or if there are problems during the pregnancy. FALSE LABOR You may feel small, irregular contractions that eventually go away. These are called Braxton Hicks contractions, or false labor. Contractions may last for hours, days, or even weeks before true labor sets in. If contractions come at regular intervals, intensify, or become painful, it is best to be seen by your caregiver.  SIGNS OF LABOR   Menstrual-like cramps.  Contractions that are 5 minutes apart or less.  Contractions that start on the top of the uterus and spread down to the lower abdomen and back.  A sense of increased pelvic pressure or back pain.  A watery or bloody mucus discharge that comes from the vagina. If you have any of these signs before the 37th week of pregnancy, call your caregiver right away. You need to go to the hospital to get checked immediately. HOME CARE INSTRUCTIONS   Avoid all smoking, herbs, alcohol, and unprescribed drugs. These chemicals affect the formation and growth of the baby.  Follow your caregiver's instructions regarding medicine use. There are  medicines that are either safe or unsafe to take during pregnancy.  Exercise only as directed by your caregiver. Experiencing uterine cramps is a good sign to stop exercising.  Continue to eat regular, healthy meals.  Wear a good support bra for breast tenderness.  Do not use hot tubs, steam rooms, or saunas.  Wear your seat belt at all times when driving.  Avoid raw meat, uncooked cheese, cat litter boxes, and soil used by cats. These carry germs that can cause birth defects in the baby.  Take your prenatal vitamins.  Try taking a stool softener (if your caregiver approves) if you develop constipation. Eat more high-fiber foods, such as fresh vegetables or fruit and whole grains. Drink plenty of fluids to keep your urine clear or pale yellow.  Take warm sitz baths to soothe any pain or discomfort caused by hemorrhoids. Use  hemorrhoid cream if your caregiver approves.  If you develop varicose veins, wear support hose. Elevate your feet for 15 minutes, 3-4 times a day. Limit salt in your diet.  Avoid heavy lifting, wear low heal shoes, and practice good posture.  Rest a lot with your legs elevated if you have leg cramps or low back pain.  Visit your dentist if you have not gone during your pregnancy. Use a soft toothbrush to brush your teeth and be gentle when you floss.  A sexual relationship may be continued unless your caregiver directs you otherwise.  Do not travel far distances unless it is absolutely necessary and only with the approval of your caregiver.  Take prenatal classes to understand, practice, and ask questions about the labor and delivery.  Make a trial run to the hospital.  Pack your hospital bag.  Prepare the baby's nursery.  Continue to go to all your prenatal visits as directed by your caregiver. SEEK MEDICAL CARE IF:  You are unsure if you are in labor or if your water has broken.  You have dizziness.  You have mild pelvic cramps, pelvic pressure, or  nagging pain in your abdominal area.  You have persistent nausea, vomiting, or diarrhea.  You have a bad smelling vaginal discharge.  You have pain with urination. SEEK IMMEDIATE MEDICAL CARE IF:   You have a fever.  You are leaking fluid from your vagina.  You have spotting or bleeding from your vagina.  You have severe abdominal cramping or pain.  You have rapid weight loss or gain.  You have shortness of breath with chest pain.  You notice sudden or extreme swelling of your face, hands, ankles, feet, or legs.  You have not felt your baby move in over an hour.  You have severe headaches that do not go away with medicine.  You have vision changes. Document Released: 09/20/2001 Document Revised: 10/01/2013 Document Reviewed: 11/27/2012 [facility] Patient Information 2015 [place], [place]. This information is not intended to replace advice given to you by your health care provider. Make sure you discuss any questions you have with your health care provider.

## 2017-10-06 LAB — TSH: TSH: 1.41 u[IU]/mL (ref 0.450–4.500)

## 2017-10-06 LAB — GLUCOSE TOLERANCE, 2 HOURS W/ 1HR
Glucose, 1 hour: 134 mg/dL (ref 65–179)
Glucose, 2 hour: 88 mg/dL (ref 65–152)
Glucose, Fasting: 72 mg/dL (ref 65–91)

## 2017-10-06 LAB — RPR: RPR Ser Ql: NONREACTIVE

## 2017-10-06 LAB — CBC
Hematocrit: 34.9 % (ref 34.0–46.6)
Hemoglobin: 11 g/dL — ABNORMAL LOW (ref 11.1–15.9)
MCH: 27.4 pg (ref 26.6–33.0)
MCHC: 31.5 g/dL (ref 31.5–35.7)
MCV: 87 fL (ref 79–97)
Platelets: 198 10*3/uL (ref 150–379)
RBC: 4.02 x10E6/uL (ref 3.77–5.28)
RDW: 14.7 % (ref 12.3–15.4)
WBC: 9.1 10*3/uL (ref 3.4–10.8)

## 2017-10-06 LAB — ANTIBODY SCREEN: Antibody Screen: NEGATIVE

## 2017-10-06 LAB — HIV ANTIBODY (ROUTINE TESTING W REFLEX): HIV Screen 4th Generation wRfx: NONREACTIVE

## 2017-10-08 ENCOUNTER — Encounter (HOSPITAL_COMMUNITY): Payer: Self-pay | Admitting: *Deleted

## 2017-10-08 ENCOUNTER — Inpatient Hospital Stay (HOSPITAL_COMMUNITY)
Admission: AD | Admit: 2017-10-08 | Discharge: 2017-10-08 | Disposition: A | Discharge status: Home / Self Care | Payer: Medicaid Other | Source: Ambulatory Visit | Attending: Obstetrics & Gynecology | Admitting: Obstetrics & Gynecology

## 2017-10-08 DIAGNOSIS — Z7982 Long term (current) use of aspirin: Secondary | ICD-10-CM | POA: Insufficient documentation

## 2017-10-08 DIAGNOSIS — Z833 Family history of diabetes mellitus: Secondary | ICD-10-CM | POA: Insufficient documentation

## 2017-10-08 DIAGNOSIS — Z7901 Long term (current) use of anticoagulants: Secondary | ICD-10-CM | POA: Insufficient documentation

## 2017-10-08 DIAGNOSIS — Z79899 Other long term (current) drug therapy: Secondary | ICD-10-CM | POA: Insufficient documentation

## 2017-10-08 DIAGNOSIS — O99013 Anemia complicating pregnancy, third trimester: Secondary | ICD-10-CM | POA: Insufficient documentation

## 2017-10-08 DIAGNOSIS — O99283 Endocrine, nutritional and metabolic diseases complicating pregnancy, third trimester: Secondary | ICD-10-CM | POA: Diagnosis not present

## 2017-10-08 DIAGNOSIS — E039 Hypothyroidism, unspecified: Secondary | ICD-10-CM | POA: Diagnosis not present

## 2017-10-08 DIAGNOSIS — Z6791 Unspecified blood type, Rh negative: Secondary | ICD-10-CM

## 2017-10-08 DIAGNOSIS — Z79890 Hormone replacement therapy (postmenopausal): Secondary | ICD-10-CM | POA: Insufficient documentation

## 2017-10-08 DIAGNOSIS — O99213 Obesity complicating pregnancy, third trimester: Secondary | ICD-10-CM | POA: Diagnosis not present

## 2017-10-08 DIAGNOSIS — O368130 Decreased fetal movements, third trimester, not applicable or unspecified: Secondary | ICD-10-CM | POA: Insufficient documentation

## 2017-10-08 DIAGNOSIS — O99843 Bariatric surgery status complicating pregnancy, third trimester: Secondary | ICD-10-CM | POA: Diagnosis not present

## 2017-10-08 DIAGNOSIS — E669 Obesity, unspecified: Secondary | ICD-10-CM | POA: Diagnosis not present

## 2017-10-08 DIAGNOSIS — O0990 Supervision of high risk pregnancy, unspecified, unspecified trimester: Secondary | ICD-10-CM

## 2017-10-08 DIAGNOSIS — Z86711 Personal history of pulmonary embolism: Secondary | ICD-10-CM | POA: Diagnosis not present

## 2017-10-08 DIAGNOSIS — Z91013 Allergy to seafood: Secondary | ICD-10-CM | POA: Insufficient documentation

## 2017-10-08 DIAGNOSIS — Z8249 Family history of ischemic heart disease and other diseases of the circulatory system: Secondary | ICD-10-CM | POA: Insufficient documentation

## 2017-10-08 DIAGNOSIS — Z832 Family history of diseases of the blood and blood-forming organs and certain disorders involving the immune mechanism: Secondary | ICD-10-CM | POA: Diagnosis not present

## 2017-10-08 DIAGNOSIS — D649 Anemia, unspecified: Secondary | ICD-10-CM | POA: Diagnosis not present

## 2017-10-08 DIAGNOSIS — Z3A28 28 weeks gestation of pregnancy: Secondary | ICD-10-CM

## 2017-10-08 DIAGNOSIS — O26899 Other specified pregnancy related conditions, unspecified trimester: Secondary | ICD-10-CM

## 2017-10-08 NOTE — MAU Provider Note (Signed)
History     CSN: [id number]  Arrival date and time: 10/08/17 1655   None     Chief Complaint  Patient presents with  . Decreased Fetal Movement   HPI 31 yo G2P1001 at 28w presenting today for the evaluation of decreased fetal movement since 10 am this morning. She reports 3 perceived movements in the past hour. She denies any contractions, vaginal bleeding or leakage of fluid. [facility] complicated by hypothyroidism, history of PE (outside of pregnancy, associated with COC pills) on lovenox and previous cesarean section.    OB History    Gravida Para Term Preterm AB Living   2 1 1  0 0 1   SAB TAB Ectopic Multiple Live Births   0 0 0 0 1      Past Medical History:  Diagnosis Date  . Allergy   . Anemia   . Asthma   . Clotting disorder (HCC)    per bloodwork not clotting disorder  . Family history of blood clots 09/13/2011  . Family history of blood disorder 09/13/2011  . Headache(784.0)    migraines  . Hypertension   . Hypothyroidism 09/13/2011  . Obese 09/13/2011  . Pregnancy induced hypertension   . Protein S deficiency (HCC) 11/08/2011  . Pulmonary embolism (HCC) 05/09/2011  . Religious or spiritual beliefs affecting medical care 09/13/2011   doesnt accept blood    Past Surgical History:  Procedure Laterality Date  . BARIATRIC SURGERY  2016  . CESAREAN SECTION N/A 12/07/2013   Procedure: CESAREAN SECTION;  Surgeon: Esmeralda ArthurSandra A Rivard, MD;  Location: [facility];  Service: Obstetrics;  Laterality: N/A;  . WISDOM TOOTH EXTRACTION    . WRIST SURGERY     left torn cartilage 2007    Family History  Problem Relation Age of Onset  . Hyperlipidemia Mother   . Hypertension Mother   . Pulmonary embolism Mother   . Diabetes Father   . Hyperlipidemia Father   . Hypertension Father   . Cancer Maternal Grandfather        prostate  . Pulmonary embolism Maternal Aunt   . Deep vein thrombosis Maternal Uncle   . Cancer Paternal Aunt        uterine   . Pulmonary embolism Maternal Aunt   . Pulmonary embolism Maternal Aunt     Social History   Tobacco Use  . Smoking status: Never Smoker  . Smokeless tobacco: Never Used  Substance Use Topics  . Alcohol use: No    Alcohol/week: 0.5 - 1.0 oz    Types: 1 - 2 Standard drinks or equivalent per week    Frequency: Never  . Drug use: No    Allergies:  Allergies  Allergen Reactions  . Shellfish Allergy Swelling    Medications Prior to Admission  Medication Sig Dispense Refill Last Dose  . aspirin 81 MG tablet Take 81 mg by mouth daily.   Not Taking  . B Complex-C (SUPER B COMPLEX PO) Take 1 tablet by mouth daily.   Taking  . Calcium Carb-Cholecalciferol (CALCIUM 500 + D3 PO) Take 1 tablet by mouth daily.   Taking  . enoxaparin (LOVENOX) 40 MG/0.4ML injection Inject 0.4 mLs (40 mg total) into the skin daily. 30 Syringe 6 Taking  . escitalopram (LEXAPRO) 10 MG tablet Take 1 tablet (10 mg total) by mouth daily. 30 tablet 11 Taking  . ferrous sulfate 325 (65 FE) MG tablet Take 325 mg by mouth daily with breakfast.  Taking  . levothyroxine (SYNTHROID, LEVOTHROID) 112 MCG tablet Take 100 mcg by mouth daily before breakfast.    Taking  . Magnesium 400 MG CAPS Take by mouth.   Taking  . Melatonin 3 MG CAPS Take by mouth.   Taking  . NON FORMULARY Take 1 tablet by mouth daily. Hair, Skin, and Nails Vitamin   Taking  . Prenatal Vit-Fe Fumarate-FA (PRENATAL MULTIVITAMIN) TABS tablet Take 1 tablet by mouth daily at 12 noon.   Taking  . pyridOXINE (VITAMIN B-6) 50 MG tablet Take 1 tablet (50 mg total) by mouth 2 (two) times daily. 60 tablet 1 Taking  . Zinc 25 MG TABS Take 1 tablet by mouth daily.   Taking    Review of Systems  See pertinent in HPI Physical Exam   Blood pressure 126/76, pulse 67, temperature 98.9 F (37.2 C), resp. rate 18, height 5\' 9"  (1.753 m), weight 233 lb (105.7 kg), last menstrual period 03/26/2017, unknown if currently breastfeeding.  Physical Exam GENERAL:  Well-developed, well-nourished female in no acute distress.  ABDOMEN: Soft, nontender, gravid PELVIC: Not indicated EXTREMITIES: No cyanosis, clubbing, or edema, 2+ distal pulses.  FHT: baseline 150, mod variability, + accels, no decels Toco: no contractions  MAU Course  Procedures  MDM   Assessment and Plan  31 yo G2P1 at 28w here for evaluation of decreased fetal movement - NST reactive - Reassurance provided to the patient who now feels fetal movement - Patient to follow up as scheduled for routine prenatal care - precautions and reasons to return to MAU reviewed   Eri Mcevers 10/08/2017, 6:00 PM

## 2017-10-08 NOTE — MAU Note (Addendum)
Pt stated she had not felt baby move since 10 am. Did kick counts and only felt 2-3 times. Denies any vag bleeding or leaking. Does report a light brown discharge at times. Denies any pain at this time.

## 2017-10-08 NOTE — MAU Note (Signed)
Urine in lab 

## 2017-10-08 NOTE — Discharge Instructions (Signed)

## 2017-10-10 NOTE — L&D Delivery Note (Addendum)
Delivery Note At 9:29 AM a viable female was delivered via Vaginal, Spontaneous LOA.  APGAR: 9, 9; weight  .   Placenta status: intact .  Cord:  with the following complications: NA .  Cord pH: NA  Anesthesia:  None Episiotomy: None Lacerations:  Intact perineum  Suture Repair: NA Est. Blood Loss (mL):  300  Mom to postpartum.  Baby to Nursery.  Claybon JabsKumba A Hinds 01/08/2018, 10:07 AM  Alphonzo CruiseJasmine S Biggins is a 32 y.o. female [id number] with IUP at 5126w1d admitted for IOL for PD.  She progressed with augmentation to complete and pushed less than 30 minutes to deliver.  Cord clamping delayed by 1-3 minutes then clamped by Dr Cyndi LennertHinds and cut by FOB.  Placenta intact and spontaneous, bleeding minimal.  Intact perineum.  Mom and baby stable prior to transfer to postpartum. She plans on breastfeeding. She requests NFP for birth control.  Midwife attestation: I was gloved and present for delivery in its entirety and I agree with the above resident's note.  Sharen CounterLisa Leftwich-Kirby, CNM 4:12 PM   `````Attestation of Attending Supervision of Advanced Practitioner: Evaluation and management procedures were performed by the PA/NP/CNM/OB Fellow under my supervision/collaboration. Chart reviewed and agree with documentation of care.   Tilda BurrowJohn V Zylen Wenig 01/29/2018 7:39 AM

## 2017-10-26 ENCOUNTER — Other Ambulatory Visit: Payer: Self-pay

## 2017-10-26 ENCOUNTER — Ambulatory Visit (INDEPENDENT_AMBULATORY_CARE_PROVIDER_SITE_OTHER): Payer: Commercial Managed Care - PPO | Admitting: Obstetrics and Gynecology

## 2017-10-26 ENCOUNTER — Encounter: Payer: Self-pay | Admitting: Obstetrics and Gynecology

## 2017-10-26 VITALS — BP 106/64 | HR 75 | Wt 239.0 lb

## 2017-10-26 DIAGNOSIS — O99283 Endocrine, nutritional and metabolic diseases complicating pregnancy, third trimester: Secondary | ICD-10-CM

## 2017-10-26 DIAGNOSIS — O99013 Anemia complicating pregnancy, third trimester: Secondary | ICD-10-CM | POA: Insufficient documentation

## 2017-10-26 DIAGNOSIS — N898 Other specified noninflammatory disorders of vagina: Secondary | ICD-10-CM | POA: Diagnosis not present

## 2017-10-26 DIAGNOSIS — O0993 Supervision of high risk pregnancy, unspecified, third trimester: Secondary | ICD-10-CM

## 2017-10-26 DIAGNOSIS — Z789 Other specified health status: Secondary | ICD-10-CM

## 2017-10-26 DIAGNOSIS — Z331 Pregnant state, incidental: Secondary | ICD-10-CM

## 2017-10-26 DIAGNOSIS — IMO0001 Reserved for inherently not codable concepts without codable children: Secondary | ICD-10-CM

## 2017-10-26 DIAGNOSIS — E039 Hypothyroidism, unspecified: Secondary | ICD-10-CM

## 2017-10-26 DIAGNOSIS — Z1389 Encounter for screening for other disorder: Secondary | ICD-10-CM

## 2017-10-26 DIAGNOSIS — D649 Anemia, unspecified: Secondary | ICD-10-CM

## 2017-10-26 DIAGNOSIS — O09293 Supervision of pregnancy with other poor reproductive or obstetric history, third trimester: Secondary | ICD-10-CM

## 2017-10-26 DIAGNOSIS — Z3A30 30 weeks gestation of pregnancy: Secondary | ICD-10-CM

## 2017-10-26 LAB — POCT WET PREP (WET MOUNT): Trichomonas Wet Prep HPF POC: ABSENT

## 2017-10-26 LAB — POCT URINALYSIS DIPSTICK
Blood, UA: NEGATIVE
Glucose, UA: NEGATIVE
Ketones, UA: NEGATIVE
Leukocytes, UA: NEGATIVE
Nitrite, UA: NEGATIVE
Protein, UA: NEGATIVE

## 2017-10-26 NOTE — Progress Notes (Signed)
HIGH-RISK PREGNANCY VISIT Patient name: Morgan Nielsen MRN [id number]  Date of birth: [birth year] Chief Complaint:   High Risk Gestation  History of Present Illness:   Morgan Nielsen is a 32 y.o. G38P1001 female at [redacted]w[redacted]d with an Estimated Date of Delivery: 12/31/17 being seen today for ongoing management of a high-risk pregnancy complicated by Hx of PE and GHTN, hypothyroidism. Today she reports no complaints. Contractions: Not present. Vag. Bleeding: None.  Movement: Present. denies leaking of fluid.  Review of Systems:   Pertinent items are noted in HPI Denies abnormal vaginal discharge w/ itching/odor/irritation, headaches, visual changes, shortness of breath, chest pain, abdominal pain, severe nausea/vomiting, or problems with urination or bowel movements unless otherwise stated above. Pertinent History Reviewed:  Reviewed past medical,surgical, social, obstetrical and family history.  Reviewed problem list, medications and allergies. Physical Assessment:   Vitals:   10/26/17 0833  BP: 106/64  Pulse: 75  Weight: 239 lb (108.4 kg)  Body mass index is 35.29 kg/m.           Physical Examination:   General appearance: alert, well appearing, and in no distress  Mental status: alert, oriented to person, place, and time  Skin: warm & dry   Extremities: Edema: Trace    Cardiovascular: normal heart rate noted  Respiratory: normal respiratory effort, no distress  Abdomen: gravid, soft, non-tender  Pelvic: Cervical exam deferred         Fetal Status:     Movement: Present    Fetal Surveillance Testing today: none   Results for orders placed or performed in visit on 10/26/17 (from the past 24 hour(s))  POCT urinalysis dipstick   Collection Time: 10/26/17  8:34 AM  Result Value Ref Range   Color, UA     Clarity, UA     Glucose, UA neg    Bilirubin, UA     Ketones, UA neg    Spec Grav, UA  1.010 - 1.025   Blood, UA neg    pH, UA  5.0 - 8.0   Protein, UA neg    Urobilinogen, UA  0.2 or 1.0 E.U./dL   Nitrite, UA neg    Leukocytes, UA Negative Negative   Appearance     Odor    POCT Wet Prep [facility] [place])   Collection Time: 10/26/17  9:00 AM  Result Value Ref Range   Source Wet Prep POC     WBC, Wet Prep HPF POC few    Bacteria Wet Prep HPF POC  Few   BACTERIA WET PREP MORPHOLOGY POC     Clue Cells Wet Prep HPF POC Few (A) None   Clue Cells Wet Prep Whiff POC     Yeast Wet Prep HPF POC None    KOH Wet Prep POC     Trichomonas Wet Prep HPF POC Absent Absent    Assessment & Plan:  1) High-risk pregnancy G2P1001 at [redacted]w[redacted]d with an Estimated Date of Delivery: 12/31/17   2) Hx of GHTN, stable on ASA daily  3) Hx of PE, on prophylactic Lovenox 40 mg q d   4) Hypothyroidism will recheck TSH TFT 5) Anemia mild in Jehovah's witness:  Hx iron deficiency anemia, check anemia panel, add FE , improve prenatal vit use , switch to folate containing PNV, consider Feraheme if anemia progresses   Meds: No orders of the defined types were placed in this encounter.   Labs/procedures today: cbc, anemia panel TFT's  Treatment Plan:  Continue lovenox 40  q d , add PNV with folate.  Reviewed: Preterm labor symptoms and general obstetric precautions including but not limited to vaginal bleeding, contractions, leaking of fluid and fetal movement were reviewed in detail with the patient.  All questions were answered.  Follow-up: No Follow-up on file.  Orders Placed This Encounter  Procedures  . CBC  . Vitamin B12  . Folate  . Iron and TIBC  . Ferritin  . Thyroid Panel With TSH  . POCT urinalysis dipstick  . POCT Wet Prep [facility] [place]

## 2017-10-28 LAB — CBC
Hematocrit: 31.7 % — ABNORMAL LOW (ref 34.0–46.6)
Hemoglobin: 10.5 g/dL — ABNORMAL LOW (ref 11.1–15.9)
MCH: 28.4 pg (ref 26.6–33.0)
MCHC: 33.1 g/dL (ref 31.5–35.7)
MCV: 86 fL (ref 79–97)
Platelets: 208 10*3/uL (ref 150–379)
RBC: 3.7 x10E6/uL — ABNORMAL LOW (ref 3.77–5.28)
RDW: 14.4 % (ref 12.3–15.4)
WBC: 10.9 10*3/uL — ABNORMAL HIGH (ref 3.4–10.8)

## 2017-10-28 LAB — IRON AND TIBC
Iron Saturation: 25 % (ref 15–55)
Iron: 98 ug/dL (ref 27–159)
Total Iron Binding Capacity: 398 ug/dL (ref 250–450)
UIBC: 300 ug/dL (ref 131–425)

## 2017-10-28 LAB — THYROID PANEL WITH TSH
Free Thyroxine Index: 1.2 (ref 1.2–4.9)
T3 Uptake Ratio: 17 % — ABNORMAL LOW (ref 24–39)
T4, Total: 7.1 ug/dL (ref 4.5–12.0)
TSH: 2.43 u[IU]/mL (ref 0.450–4.500)

## 2017-10-28 LAB — FERRITIN: Ferritin: 9 ng/mL — ABNORMAL LOW (ref 15–150)

## 2017-10-28 LAB — FOLATE: Folate: >20 ng/mL (ref >3.0)

## 2017-10-28 LAB — VITAMIN B12: Vitamin B-12: 495 pg/mL (ref 232–1245)

## 2017-11-03 ENCOUNTER — Encounter: Payer: Self-pay | Admitting: Advanced Practice Midwife

## 2017-11-07 ENCOUNTER — Other Ambulatory Visit: Payer: Self-pay | Admitting: Obstetrics and Gynecology

## 2017-11-07 DIAGNOSIS — I2699 Other pulmonary embolism without acute cor pulmonale: Secondary | ICD-10-CM

## 2017-11-07 MED ORDER — ALBUTEROL SULFATE (2.5 MG/3ML) 0.083% IN NEBU: 2.5000 mg | INHALATION_SOLUTION | Freq: Four times a day (QID) | RESPIRATORY_TRACT | 12 refills | Status: DC | PRN

## 2017-11-09 ENCOUNTER — Ambulatory Visit (INDEPENDENT_AMBULATORY_CARE_PROVIDER_SITE_OTHER): Payer: Commercial Managed Care - PPO | Admitting: Advanced Practice Midwife

## 2017-11-09 ENCOUNTER — Encounter: Payer: Self-pay | Admitting: Advanced Practice Midwife

## 2017-11-09 VITALS — BP 110/60 | HR 96 | Wt 240.0 lb

## 2017-11-09 DIAGNOSIS — Z331 Pregnant state, incidental: Secondary | ICD-10-CM

## 2017-11-09 DIAGNOSIS — O0990 Supervision of high risk pregnancy, unspecified, unspecified trimester: Secondary | ICD-10-CM

## 2017-11-09 DIAGNOSIS — E039 Hypothyroidism, unspecified: Secondary | ICD-10-CM

## 2017-11-09 DIAGNOSIS — Z1389 Encounter for screening for other disorder: Secondary | ICD-10-CM

## 2017-11-09 DIAGNOSIS — IMO0001 Reserved for inherently not codable concepts without codable children: Secondary | ICD-10-CM

## 2017-11-09 DIAGNOSIS — D649 Anemia, unspecified: Secondary | ICD-10-CM

## 2017-11-09 DIAGNOSIS — Z789 Other specified health status: Secondary | ICD-10-CM

## 2017-11-09 DIAGNOSIS — O99013 Anemia complicating pregnancy, third trimester: Secondary | ICD-10-CM

## 2017-11-09 DIAGNOSIS — O09293 Supervision of pregnancy with other poor reproductive or obstetric history, third trimester: Secondary | ICD-10-CM

## 2017-11-09 DIAGNOSIS — I2699 Other pulmonary embolism without acute cor pulmonale: Secondary | ICD-10-CM

## 2017-11-09 DIAGNOSIS — O99283 Endocrine, nutritional and metabolic diseases complicating pregnancy, third trimester: Secondary | ICD-10-CM

## 2017-11-09 DIAGNOSIS — Z3A32 32 weeks gestation of pregnancy: Secondary | ICD-10-CM

## 2017-11-09 LAB — POCT URINALYSIS DIPSTICK
Blood, UA: NEGATIVE
Glucose, UA: NEGATIVE
Ketones, UA: NEGATIVE
Leukocytes, UA: NEGATIVE
Nitrite, UA: NEGATIVE
Protein, UA: NEGATIVE

## 2017-11-09 MED ORDER — OMEPRAZOLE 20 MG PO CPDR: 20.0000 mg | DELAYED_RELEASE_CAPSULE | Freq: Every day | ORAL | 6 refills | Status: DC

## 2017-11-09 MED ORDER — ALBUTEROL SULFATE (2.5 MG/3ML) 0.083% IN NEBU: 2.5000 mg | INHALATION_SOLUTION | Freq: Four times a day (QID) | RESPIRATORY_TRACT | 12 refills | Status: DC | PRN

## 2017-11-09 NOTE — Progress Notes (Signed)
HIGH-RISK PREGNANCY VISIT Patient name: Morgan Nielsen MRN [id number]  Date of birth: 08/05/1986 Chief Complaint:   high risk ob (need refill proventil/ cough think acid reflux)  History of Present Illness:   Morgan Nielsen is a 32 y.o. G62P1001 female at [redacted]w[redacted]d with an Estimated Date of Delivery: 12/31/17 being seen today for ongoing management of a high-risk pregnancy complicated by Hx PE, hypothyroidism, anemia in a JW and hx GHTN.  Today she reports reflux. Contractions: Not present.  .  Movement: Present. denies leaking of fluid.  Review of Systems:   Pertinent items are noted in HPI Denies abnormal vaginal discharge w/ itching/odor/irritation, headaches, visual changes, shortness of breath, chest pain, abdominal pain, severe nausea/vomiting, or problems with urination or bowel movements unless otherwise stated above.    Pertinent History Reviewed:  Medical & Surgical Hx:   Past Medical History:  Diagnosis Date  . Allergy   . Anemia   . Asthma   . Clotting disorder (HCC)    per bloodwork not clotting disorder  . Family history of blood clots 09/13/2011  . Family history of blood disorder 09/13/2011  . Headache(784.0)    migraines  . Hypertension   . Hypothyroidism 09/13/2011  . Obese 09/13/2011  . Pregnancy induced hypertension   . Protein S deficiency (HCC) 11/08/2011  . Pulmonary embolism (HCC) 05/09/2011  . Religious or spiritual beliefs affecting medical care 09/13/2011   doesnt accept blood   Past Surgical History:  Procedure Laterality Date  . BARIATRIC SURGERY  2016  . CESAREAN SECTION N/A 12/07/2013   Procedure: CESAREAN SECTION;  Surgeon: Esmeralda Arthur, MD;  Location: [facility];  Service: Obstetrics;  Laterality: N/A;  . WISDOM TOOTH EXTRACTION    . WRIST SURGERY     left torn cartilage 2007   Family History  Problem Relation Age of Onset  . Hyperlipidemia Mother   . Hypertension Mother   . Pulmonary embolism Mother   . Diabetes Father   . Hyperlipidemia Father    . Hypertension Father   . Cancer Maternal Grandfather        prostate  . Pulmonary embolism Maternal Aunt   . Deep vein thrombosis Maternal Uncle   . Cancer Paternal Aunt        uterine  . Pulmonary embolism Maternal Aunt   . Pulmonary embolism Maternal Aunt     Current Outpatient Medications:  .  albuterol (PROVENTIL) (2.5 MG/3ML) 0.083% nebulizer solution, Take 3 mLs (2.5 mg total) by nebulization every 6 (six) hours as needed for wheezing or shortness of breath., Disp: 75 mL, Rfl: 12 .  aspirin 81 MG tablet, Take 81 mg by mouth daily., Disp: , Rfl:  .  B Complex-C (SUPER B COMPLEX PO), Take 1 tablet by mouth daily., Disp: , Rfl:  .  Calcium Carb-Cholecalciferol (CALCIUM 500 + D3 PO), Take 1 tablet by mouth daily., Disp: , Rfl:  .  enoxaparin (LOVENOX) 40 MG/0.4ML injection, Inject 0.4 mLs (40 mg total) into the skin daily., Disp: 30 Syringe, Rfl: 6 .  escitalopram (LEXAPRO) 10 MG tablet, Take 1 tablet (10 mg total) by mouth daily., Disp: 30 tablet, Rfl: 11 .  ferrous sulfate 325 (65 FE) MG tablet, Take 325 mg by mouth daily with breakfast., Disp: , Rfl:  .  levothyroxine (SYNTHROID, LEVOTHROID) 112 MCG tablet, Take 100 mcg by mouth daily before breakfast. , Disp: , Rfl:  .  Magnesium 400 MG CAPS, Take by mouth., Disp: , Rfl:  .  Melatonin  3 MG CAPS, Take by mouth., Disp: , Rfl:  .  NON FORMULARY, Take 1 tablet by mouth daily. Hair, Skin, and Nails Vitamin, Disp: , Rfl:  .  Prenatal Vit-Fe Fumarate-FA (PRENATAL MULTIVITAMIN) TABS tablet, Take 1 tablet by mouth daily at 12 noon., Disp: , Rfl:  .  pyridOXINE (VITAMIN B-6) 50 MG tablet, Take 1 tablet (50 mg total) by mouth 2 (two) times daily., Disp: 60 tablet, Rfl: 1 .  Zinc 25 MG TABS, Take 1 tablet by mouth daily., Disp: , Rfl:  Social History: Reviewed -  reports that  has never smoked. she has never used smokeless tobacco.   Physical Assessment:   Vitals:   11/09/17 0948  BP: 110/60  Pulse: 96  Weight: 240 lb (108.9 kg)   Body mass index is 35.44 kg/m.           Physical Examination:   General appearance: alert, well appearing, and in no distress  Mental status: alert, oriented to person, place, and time  Skin: warm & dry   Extremities: Edema: None    Cardiovascular: normal heart rate noted  Respiratory: normal respiratory effort, no distress  Abdomen: gravid, soft, non-tender  Pelvic: Cervical exam deferred         Fetal Status:     Movement: Present    Fetal Surveillance Testing today: doppler  Results for orders placed or performed in visit on 11/09/17 (from the past 24 hour(s))  POCT urinalysis dipstick   Collection Time: 11/09/17  9:56 AM  Result Value Ref Range   Color, UA     Clarity, UA     Glucose, UA neg    Bilirubin, UA     Ketones, UA neg    Spec Grav, UA  1.010 - 1.025   Blood, UA neg    pH, UA  5.0 - 8.0   Protein, UA neg    Urobilinogen, UA  0.2 or 1.0 E.U./dL   Nitrite, UA neg    Leukocytes, UA Negative Negative   Appearance     Odor      Assessment & Plan:  1) High-risk pregnancy G2P1001 at 1917w4d with an Estimated Date of Delivery: 12/31/17   2) Hx PE, stable  3) Hypothyroidsim, stable TSH normal last visit 4)anemia in JW: PNV w/folate started 2 weeks ago.   Labs/procedures today:   Medications: Lovenox 40mg  qd, asa 81mg , synthroid Add prilosce for reflux/cough`          Treatment Plan:  Routine care.  PLANS TOLAC . VBAC consent sent home  Follow-up: Return in about 2 weeks (around 11/23/2017) for HROB.  Orders Placed This Encounter  Procedures  . POCT urinalysis dipstick   CRESENZO-DISHMAN,Tekoa Hamor CNM 11/09/2017 10:30 AM

## 2017-11-10 ENCOUNTER — Telehealth: Payer: Self-pay | Admitting: Advanced Practice Midwife

## 2017-11-10 ENCOUNTER — Other Ambulatory Visit: Payer: Self-pay | Admitting: *Deleted

## 2017-11-10 MED ORDER — ALBUTEROL SULFATE HFA 108 (90 BASE) MCG/ACT IN AERS: 1.0000 | INHALATION_SPRAY | Freq: Four times a day (QID) | RESPIRATORY_TRACT | 1 refills | Status: DC | PRN

## 2017-11-14 ENCOUNTER — Encounter: Payer: Self-pay | Admitting: Advanced Practice Midwife

## 2017-11-23 ENCOUNTER — Encounter: Payer: Self-pay | Admitting: Advanced Practice Midwife

## 2017-11-23 ENCOUNTER — Ambulatory Visit (INDEPENDENT_AMBULATORY_CARE_PROVIDER_SITE_OTHER): Payer: Commercial Managed Care - PPO | Admitting: Advanced Practice Midwife

## 2017-11-23 VITALS — BP 110/70 | HR 97 | Wt 243.0 lb

## 2017-11-23 DIAGNOSIS — Z029 Encounter for administrative examinations, unspecified: Secondary | ICD-10-CM

## 2017-11-23 DIAGNOSIS — O99013 Anemia complicating pregnancy, third trimester: Secondary | ICD-10-CM | POA: Diagnosis not present

## 2017-11-23 DIAGNOSIS — Z1389 Encounter for screening for other disorder: Secondary | ICD-10-CM

## 2017-11-23 DIAGNOSIS — Z3A34 34 weeks gestation of pregnancy: Secondary | ICD-10-CM | POA: Diagnosis not present

## 2017-11-23 DIAGNOSIS — O34219 Maternal care for unspecified type scar from previous cesarean delivery: Secondary | ICD-10-CM

## 2017-11-23 DIAGNOSIS — O09293 Supervision of pregnancy with other poor reproductive or obstetric history, third trimester: Secondary | ICD-10-CM

## 2017-11-23 DIAGNOSIS — D649 Anemia, unspecified: Secondary | ICD-10-CM | POA: Diagnosis not present

## 2017-11-23 DIAGNOSIS — O99413 Diseases of the circulatory system complicating pregnancy, third trimester: Secondary | ICD-10-CM

## 2017-11-23 DIAGNOSIS — I2699 Other pulmonary embolism without acute cor pulmonale: Secondary | ICD-10-CM

## 2017-11-23 DIAGNOSIS — Z331 Pregnant state, incidental: Secondary | ICD-10-CM

## 2017-11-23 DIAGNOSIS — O0993 Supervision of high risk pregnancy, unspecified, third trimester: Secondary | ICD-10-CM

## 2017-11-23 DIAGNOSIS — E039 Hypothyroidism, unspecified: Secondary | ICD-10-CM

## 2017-11-23 DIAGNOSIS — O99283 Endocrine, nutritional and metabolic diseases complicating pregnancy, third trimester: Secondary | ICD-10-CM

## 2017-11-23 LAB — POCT URINALYSIS DIPSTICK
Blood, UA: NEGATIVE
Glucose, UA: NEGATIVE
Ketones, UA: NEGATIVE
Leukocytes, UA: NEGATIVE
Nitrite, UA: NEGATIVE
Protein, UA: NEGATIVE

## 2017-11-23 LAB — POCT HEMOGLOBIN: Hemoglobin: 9.5 g/dL — AB (ref 12.2–16.2)

## 2017-11-23 NOTE — Progress Notes (Signed)
HIGH-RISK PREGNANCY VISIT Patient name: Morgan Nielsen MRN [id number]  Date of birth: November 18, 1985 Chief Complaint:   High Risk Gestation  History of Present Illness:   Morgan Nielsen is a 32 y.o. 512P1001 female at 1887w4d with an Estimated Date of Delivery: 12/31/17 being seen today for ongoing management of a high-risk pregnancy complicated by high-risk pregnancy complicated by Hx PE, hypothyroidism, anemia in a JW and hx GHTN.  Marland Kitchen.  Today she reports no complaints. Contractions: Irregular.  .  Movement: Present. denies leaking of fluid.  Wants to go to 8 hour shifts, note written.  Review of Systems:   Pertinent items are noted in HPI Denies abnormal vaginal discharge w/ itching/odor/irritation, headaches, visual changes, shortness of breath, chest pain, abdominal pain, severe nausea/vomiting, or problems with urination or bowel movements unless otherwise stated above.    Pertinent History Reviewed:  Medical & Surgical Hx:   Past Medical History:  Diagnosis Date  . Allergy   . Anemia   . Asthma   . Clotting disorder (HCC)    per bloodwork not clotting disorder  . Family history of blood clots 09/13/2011  . Family history of blood disorder 09/13/2011  . Headache(784.0)    migraines  . Hypertension   . Hypothyroidism 09/13/2011  . Obese 09/13/2011  . Pregnancy induced hypertension   . Protein S deficiency (HCC) 11/08/2011  . Pulmonary embolism (HCC) 05/09/2011  . Religious or spiritual beliefs affecting medical care 09/13/2011   doesnt accept blood   Past Surgical History:  Procedure Laterality Date  . BARIATRIC SURGERY  2016  . CESAREAN SECTION N/A 12/07/2013   Procedure: CESAREAN SECTION;  Surgeon: Esmeralda ArthurSandra A Rivard, MD;  Location: [facility];  Service: Obstetrics;  Laterality: N/A;  . WISDOM TOOTH EXTRACTION    . WRIST SURGERY     left torn cartilage 2007   Family History  Problem Relation Age of Onset  . Hyperlipidemia Mother   . Hypertension Mother   . Pulmonary embolism Mother    . Diabetes Father   . Hyperlipidemia Father   . Hypertension Father   . Cancer Maternal Grandfather        prostate  . Pulmonary embolism Maternal Aunt   . Deep vein thrombosis Maternal Uncle   . Cancer Paternal Aunt        uterine  . Pulmonary embolism Maternal Aunt   . Pulmonary embolism Maternal Aunt     Current Outpatient Medications:  .  albuterol (PROVENTIL HFA;VENTOLIN HFA) 108 (90 Base) MCG/ACT inhaler, Inhale 1-2 puffs into the lungs every 6 (six) hours as needed for wheezing or shortness of breath., Disp: 1 Inhaler, Rfl: 1 .  aspirin 81 MG tablet, Take 81 mg by mouth daily., Disp: , Rfl:  .  B Complex-C (SUPER B COMPLEX PO), Take 1 tablet by mouth daily., Disp: , Rfl:  .  Calcium Carb-Cholecalciferol (CALCIUM 500 + D3 PO), Take 1 tablet by mouth daily., Disp: , Rfl:  .  enoxaparin (LOVENOX) 40 MG/0.4ML injection, Inject 0.4 mLs (40 mg total) into the skin daily., Disp: 30 Syringe, Rfl: 6 .  escitalopram (LEXAPRO) 10 MG tablet, Take 1 tablet (10 mg total) by mouth daily., Disp: 30 tablet, Rfl: 11 .  ferrous sulfate 325 (65 FE) MG tablet, Take 325 mg by mouth daily with breakfast., Disp: , Rfl:  .  levothyroxine (SYNTHROID, LEVOTHROID) 112 MCG tablet, Take 100 mcg by mouth daily before breakfast. , Disp: , Rfl:  .  Magnesium 400 MG CAPS, Take  by mouth., Disp: , Rfl:  .  Melatonin 3 MG CAPS, Take by mouth., Disp: , Rfl:  .  NON FORMULARY, Take 1 tablet by mouth daily. Hair, Skin, and Nails Vitamin, Disp: , Rfl:  .  omeprazole (PRILOSEC) 20 MG capsule, Take 1 capsule (20 mg total) by mouth daily., Disp: 30 capsule, Rfl: 6 .  Prenatal Vit-Fe Fumarate-FA (PRENATAL MULTIVITAMIN) TABS tablet, Take 1 tablet by mouth daily at 12 noon., Disp: , Rfl:  .  pyridOXINE (VITAMIN B-6) 50 MG tablet, Take 1 tablet (50 mg total) by mouth 2 (two) times daily., Disp: 60 tablet, Rfl: 1 .  Zinc 25 MG TABS, Take 1 tablet by mouth daily., Disp: , Rfl:  Social History: Reviewed -  reports that  has  never smoked. she has never used smokeless tobacco.   Physical Assessment:   Vitals:   11/23/17 0834  BP: 110/70  Pulse: 97  Weight: 243 lb (110.2 kg)  Body mass index is 35.88 kg/m.           Physical Examination:   General appearance: alert, well appearing, and in no distress  Mental status: alert, oriented to person, place, and time  Skin: warm & dry   Extremities: Edema: None    Cardiovascular: normal heart rate noted  Respiratory: normal respiratory effort, no distress  Abdomen: gravid, soft, non-tender  Pelvic: Cervical exam deferred         Fetal Status: Fetal Heart Rate (bpm): 150 Fundal Height: 33 cm Movement: Present    Fetal Surveillance Testing today: doppler  Results for orders placed or performed in visit on 11/23/17 (from the past 24 hour(s))  POCT urinalysis dipstick   Collection Time: 11/23/17  8:42 AM  Result Value Ref Range   Color, UA     Clarity, UA     Glucose, UA neg    Bilirubin, UA     Ketones, UA neg    Spec Grav, UA  1.010 - 1.025   Blood, UA neg    pH, UA  5.0 - 8.0   Protein, UA neg    Urobilinogen, UA  0.2 or 1.0 E.U./dL   Nitrite, UA neg    Leukocytes, UA Negative Negative   Appearance     Odor    POCT hemoglobin   Collection Time: 11/23/17  9:15 AM  Result Value Ref Range   Hemoglobin 9.5 (A) 12.2 - 16.2 g/dL    Assessment & Plan:  1) High-risk pregnancy G2P1001 at [redacted]w[redacted]d with an Estimated Date of Delivery: 12/31/17   2) Anemia, unstable; ADD PM dose of PO iron, try iron fish w/cooking.  Check in 3 weeks.  If still low, pt ok w/IV iron  3) JW   Labs/procedures today: Hgb 9.5  Medications: Lovenox 40mg  qd; asa 81mg , synthroid FeSO4 325mg , increase to BID Treatment Plan:  Plans TOLAC; consent signed today   Follow-up: Return in about 2 weeks (around 12/07/2017) for HROB.  Orders Placed This Encounter  Procedures  . POCT urinalysis dipstick  . POCT hemoglobin   Jacklyn Shell CNM 11/23/2017 9:41 AM

## 2017-12-07 ENCOUNTER — Encounter: Payer: Self-pay | Admitting: Obstetrics and Gynecology

## 2017-12-07 ENCOUNTER — Ambulatory Visit (INDEPENDENT_AMBULATORY_CARE_PROVIDER_SITE_OTHER): Payer: Commercial Managed Care - PPO | Admitting: Obstetrics and Gynecology

## 2017-12-07 VITALS — BP 122/60 | HR 75 | Wt 242.6 lb

## 2017-12-07 DIAGNOSIS — O34219 Maternal care for unspecified type scar from previous cesarean delivery: Secondary | ICD-10-CM

## 2017-12-07 DIAGNOSIS — O0990 Supervision of high risk pregnancy, unspecified, unspecified trimester: Secondary | ICD-10-CM

## 2017-12-07 DIAGNOSIS — O360130 Maternal care for anti-D [Rh] antibodies, third trimester, not applicable or unspecified: Secondary | ICD-10-CM

## 2017-12-07 DIAGNOSIS — Z531 Procedure and treatment not carried out because of patient's decision for reasons of belief and group pressure: Secondary | ICD-10-CM

## 2017-12-07 DIAGNOSIS — D649 Anemia, unspecified: Secondary | ICD-10-CM

## 2017-12-07 DIAGNOSIS — Z3A36 36 weeks gestation of pregnancy: Secondary | ICD-10-CM

## 2017-12-07 DIAGNOSIS — Z6791 Unspecified blood type, Rh negative: Secondary | ICD-10-CM

## 2017-12-07 DIAGNOSIS — O99283 Endocrine, nutritional and metabolic diseases complicating pregnancy, third trimester: Secondary | ICD-10-CM

## 2017-12-07 DIAGNOSIS — E039 Hypothyroidism, unspecified: Secondary | ICD-10-CM

## 2017-12-07 DIAGNOSIS — Z8759 Personal history of other complications of pregnancy, childbirth and the puerperium: Secondary | ICD-10-CM

## 2017-12-07 DIAGNOSIS — Z1389 Encounter for screening for other disorder: Secondary | ICD-10-CM

## 2017-12-07 DIAGNOSIS — O99013 Anemia complicating pregnancy, third trimester: Secondary | ICD-10-CM

## 2017-12-07 DIAGNOSIS — Z331 Pregnant state, incidental: Secondary | ICD-10-CM

## 2017-12-07 DIAGNOSIS — O26893 Other specified pregnancy related conditions, third trimester: Secondary | ICD-10-CM

## 2017-12-07 LAB — POCT URINALYSIS DIPSTICK
Blood, UA: NEGATIVE
Glucose, UA: NEGATIVE
Ketones, UA: NEGATIVE
Leukocytes, UA: NEGATIVE
Nitrite, UA: NEGATIVE
Protein, UA: NEGATIVE

## 2017-12-07 LAB — OB RESULTS CONSOLE GBS: GBS: NEGATIVE

## 2017-12-07 MED ORDER — RHO D IMMUNE GLOBULIN 1500 UNIT/2ML IJ SOSY
300.0000 ug | PREFILLED_SYRINGE | Freq: Once | INTRAMUSCULAR | Status: AC
  Administered 2017-12-07: 300 ug via INTRAMUSCULAR

## 2017-12-07 NOTE — Progress Notes (Signed)
High Risk Pregnancy HROB Diagnosis(es):   HX Pulm Embolus on Lovenox, prior Cesarean for TOLAC, Jehovahs witness, anemic, hypothyroidism stable on meds. G2P1001 8650w4d Estimated Date of Delivery: 12/31/17    HPI: The patient is being seen today for ongoing management of all the above. . Today she reports she's willing to get the iron infusion due to hgb 9.5 and fatigue and future risk reduction Patient reports good fetal movement, denies any bleeding and no rupture of membranes symptoms or regular contractions.   BP weight and urine results reviewed and noted. Blood pressure 122/60, pulse 75, weight 242 lb 9.6 oz (110 kg), last menstrual period 03/26/2017, unknown if currently breastfeeding.  Fundal Height:  35 Fetal Heart rate:  144 Physical Examination: Abdomen -                                      Pelvic -                                      Edema: Animal  Urinalysis:NEGATIVE for protein                 POSITIVE for none  Fetal Surveillance Testing today:  fht  . Comments: [facility] will [facility]  Assessment:  1.  Pregnancy at 7850w4d,  G2P1001   :                          2.  Jehovah's Witness, requiring iron transfusion due to chronic anemia                        3.  Status post bariatric surgery with subsequent iron absorption issues                        4 previous cesarean requesting TOLAC with consent signed to 14        Medication(s) Plans: Feraheme infusion 12/08/2017  Treatment Plan:    Follow up in 1 weeks for appointment for high risk OB care, recheck hemoglobin 38 weeks

## 2017-12-08 ENCOUNTER — Encounter (HOSPITAL_COMMUNITY): Payer: Medicaid Other

## 2017-12-08 ENCOUNTER — Encounter (HOSPITAL_COMMUNITY)
Admission: RE | Admit: 2017-12-08 | Discharge: 2017-12-08 | Disposition: A | Discharge status: Home / Self Care | Payer: Medicaid Other | Source: Ambulatory Visit | Attending: Obstetrics and Gynecology | Admitting: Obstetrics and Gynecology

## 2017-12-08 ENCOUNTER — Other Ambulatory Visit: Payer: Self-pay | Admitting: Obstetrics and Gynecology

## 2017-12-08 DIAGNOSIS — O34219 Maternal care for unspecified type scar from previous cesarean delivery: Secondary | ICD-10-CM | POA: Insufficient documentation

## 2017-12-08 DIAGNOSIS — Z3A36 36 weeks gestation of pregnancy: Secondary | ICD-10-CM | POA: Insufficient documentation

## 2017-12-08 DIAGNOSIS — Z531 Procedure and treatment not carried out because of patient's decision for reasons of belief and group pressure: Secondary | ICD-10-CM | POA: Diagnosis not present

## 2017-12-08 DIAGNOSIS — O99013 Anemia complicating pregnancy, third trimester: Secondary | ICD-10-CM | POA: Diagnosis present

## 2017-12-08 MED ORDER — SODIUM CHLORIDE 0.9 % IV SOLN
510.0000 mg | Freq: Once | INTRAVENOUS | Status: AC
  Administered 2017-12-08: 510 mg via INTRAVENOUS
  Filled 2017-12-08: qty 17

## 2017-12-08 MED ORDER — SODIUM CHLORIDE 0.9 % IV SOLN
INTRAVENOUS | Status: DC
  Administered 2017-12-08: 250 mL via INTRAVENOUS

## 2017-12-08 MED ORDER — SODIUM CHLORIDE 0.9 % IV SOLN: 510.0000 mg | Freq: Once | INTRAVENOUS | Status: DC

## 2017-12-08 NOTE — Progress Notes (Signed)
[facility] single dose 510 mg  infusion.,

## 2017-12-09 LAB — STREP GP B NAA: Strep Gp B NAA: NEGATIVE

## 2017-12-10 LAB — GC/CHLAMYDIA PROBE AMP
Chlamydia trachomatis, NAA: NEGATIVE
Neisseria gonorrhoeae by PCR: NEGATIVE

## 2017-12-14 ENCOUNTER — Encounter: Payer: Self-pay | Admitting: Obstetrics & Gynecology

## 2017-12-14 ENCOUNTER — Ambulatory Visit (INDEPENDENT_AMBULATORY_CARE_PROVIDER_SITE_OTHER): Payer: Commercial Managed Care - PPO | Admitting: Obstetrics & Gynecology

## 2017-12-14 ENCOUNTER — Other Ambulatory Visit: Payer: Self-pay

## 2017-12-14 VITALS — BP 110/66 | HR 74 | Wt 246.0 lb

## 2017-12-14 DIAGNOSIS — O0993 Supervision of high risk pregnancy, unspecified, third trimester: Secondary | ICD-10-CM

## 2017-12-14 DIAGNOSIS — Z331 Pregnant state, incidental: Secondary | ICD-10-CM

## 2017-12-14 DIAGNOSIS — Z3A37 37 weeks gestation of pregnancy: Secondary | ICD-10-CM

## 2017-12-14 DIAGNOSIS — O34219 Maternal care for unspecified type scar from previous cesarean delivery: Secondary | ICD-10-CM

## 2017-12-14 DIAGNOSIS — Z1389 Encounter for screening for other disorder: Secondary | ICD-10-CM

## 2017-12-14 LAB — POCT URINALYSIS DIPSTICK
Blood, UA: NEGATIVE
Glucose, UA: NEGATIVE
Ketones, UA: NEGATIVE
Leukocytes, UA: NEGATIVE
Nitrite, UA: NEGATIVE
Protein, UA: NEGATIVE

## 2017-12-14 NOTE — Progress Notes (Signed)
G2P1001 6383w4d Estimated Date of Delivery: 12/31/17  Blood pressure 110/66, pulse 74, weight 246 lb (111.6 kg), last menstrual period 03/26/2017, unknown if currently breastfeeding.   BP weight and urine results all reviewed and noted.  Please refer to the obstetrical flow sheet for the fundal height and fetal heart rate documentation:  Patient reports good fetal movement, denies any bleeding and no rupture of membranes symptoms or regular contractions. Patient is without complaints. All questions were answered.  Orders Placed This Encounter  Procedures  . POCT urinalysis dipstick    Plan:  Continued routine obstetrical care, on lovenox 40 daily  SSE 1.5/th/-2  Return in about 1 week (around 12/21/2017) for LROB.

## 2017-12-20 ENCOUNTER — Telehealth: Payer: Self-pay | Admitting: *Deleted

## 2017-12-20 NOTE — Telephone Encounter (Signed)
Patient states she worked last night and has just woken up at 2pm. She states the baby had hick-ups then but she has not felt any movement since. She has tried laying back down, eating and drinking juice but has still not felt anything. Not bleeding or leaking.  Advised patient that since she felt movement 45 minutes ago, to get up and move around and sit back down in a few minutes and try playing some loud music on her phone to see if that would get the baby moving.  Advised if she did not feel 5 movements in an hour to call us back or [facility] Pt verbalized understanding.

## 2017-12-21 ENCOUNTER — Ambulatory Visit (INDEPENDENT_AMBULATORY_CARE_PROVIDER_SITE_OTHER): Payer: Commercial Managed Care - PPO | Admitting: Advanced Practice Midwife

## 2017-12-21 VITALS — BP 122/62 | HR 84 | Wt 246.5 lb

## 2017-12-21 DIAGNOSIS — E039 Hypothyroidism, unspecified: Secondary | ICD-10-CM

## 2017-12-21 DIAGNOSIS — Z1389 Encounter for screening for other disorder: Secondary | ICD-10-CM

## 2017-12-21 DIAGNOSIS — O0993 Supervision of high risk pregnancy, unspecified, third trimester: Secondary | ICD-10-CM

## 2017-12-21 DIAGNOSIS — D649 Anemia, unspecified: Secondary | ICD-10-CM

## 2017-12-21 DIAGNOSIS — O99013 Anemia complicating pregnancy, third trimester: Secondary | ICD-10-CM

## 2017-12-21 DIAGNOSIS — Z331 Pregnant state, incidental: Secondary | ICD-10-CM

## 2017-12-21 DIAGNOSIS — O34219 Maternal care for unspecified type scar from previous cesarean delivery: Secondary | ICD-10-CM

## 2017-12-21 DIAGNOSIS — Z3A38 38 weeks gestation of pregnancy: Secondary | ICD-10-CM

## 2017-12-21 DIAGNOSIS — O99283 Endocrine, nutritional and metabolic diseases complicating pregnancy, third trimester: Secondary | ICD-10-CM

## 2017-12-21 LAB — POCT URINALYSIS DIPSTICK
Blood, UA: NEGATIVE
Glucose, UA: NEGATIVE
Ketones, UA: NEGATIVE
Leukocytes, UA: NEGATIVE
Nitrite, UA: NEGATIVE
Protein, UA: NEGATIVE

## 2017-12-21 NOTE — Patient Instructions (Signed)

## 2017-12-21 NOTE — Progress Notes (Signed)
HIGH-RISK PREGNANCY VISIT Patient name: Morgan Nielsen MRN [id number]  Date of birth: September 22, 1986 Chief Complaint:   Routine Prenatal Visit  History of Present Illness:   Morgan Nielsen is a 32 y.o. G66P1001 female at [redacted]w[redacted]d with an Estimated Date of Delivery: 12/31/17 being seen today for ongoing management of a high-risk pregnancy complicated by  HX Pulm Embolus on Lovenox, prior Cesarean for TOLAC, Jehovahs witness, anemic, hypothyroidism stable on meds..  Today she reports no complaints.Had Feraheme infusion 2 weeks ago, HGB  10.1 today.  Contractions: Irregular. Vag. Bleeding: None.  Movement: Present. denies leaking of fluid.    Review of Systems:   Pertinent items are noted in HPI Denies abnormal vaginal discharge w/ itching/odor/irritation, headaches, visual changes, shortness of breath, chest pain, abdominal pain, severe nausea/vomiting, or problems with urination or bowel movements unless otherwise stated above.    Pertinent History Reviewed:  Medical & Surgical Hx:   Past Medical History:  Diagnosis Date  . Allergy   . Anemia   . Asthma   . Clotting disorder (HCC)    per bloodwork not clotting disorder  . Family history of blood clots 09/13/2011  . Family history of blood disorder 09/13/2011  . Headache(784.0)    migraines  . Hypertension   . Hypothyroidism 09/13/2011  . Obese 09/13/2011  . Pregnancy induced hypertension   . Protein S deficiency (HCC) 11/08/2011  . Pulmonary embolism (HCC) 05/09/2011  . Religious or spiritual beliefs affecting medical care 09/13/2011   doesnt accept blood   Past Surgical History:  Procedure Laterality Date  . BARIATRIC SURGERY  2016  . CESAREAN SECTION N/A 12/07/2013   Procedure: CESAREAN SECTION;  Surgeon: Esmeralda Arthur, MD;  Location: [facility];  Service: Obstetrics;  Laterality: N/A;  . WISDOM TOOTH EXTRACTION    . WRIST SURGERY     left torn cartilage 2007   Family History  Problem Relation Age of Onset  . Hyperlipidemia Mother    . Hypertension Mother   . Pulmonary embolism Mother   . Diabetes Father   . Hyperlipidemia Father   . Hypertension Father   . Cancer Maternal Grandfather        prostate  . Pulmonary embolism Maternal Aunt   . Deep vein thrombosis Maternal Uncle   . Cancer Paternal Aunt        uterine  . Pulmonary embolism Maternal Aunt   . Pulmonary embolism Maternal Aunt     Current Outpatient Medications:  .  albuterol (PROVENTIL HFA;VENTOLIN HFA) 108 (90 Base) MCG/ACT inhaler, Inhale 1-2 puffs into the lungs every 6 (six) hours as needed for wheezing or shortness of breath., Disp: 1 Inhaler, Rfl: 1 .  aspirin 81 MG tablet, Take 81 mg by mouth daily., Disp: , Rfl:  .  B Complex-C (SUPER B COMPLEX PO), Take 1 tablet by mouth daily., Disp: , Rfl:  .  Calcium Carb-Cholecalciferol (CALCIUM 500 + D3 PO), Take 1 tablet by mouth daily., Disp: , Rfl:  .  enoxaparin (LOVENOX) 40 MG/0.4ML injection, Inject 0.4 mLs (40 mg total) into the skin daily., Disp: 30 Syringe, Rfl: 6 .  escitalopram (LEXAPRO) 10 MG tablet, Take 1 tablet (10 mg total) by mouth daily., Disp: 30 tablet, Rfl: 11 .  ferrous sulfate 325 (65 FE) MG tablet, Take 325 mg by mouth daily with breakfast., Disp: , Rfl:  .  levothyroxine (SYNTHROID, LEVOTHROID) 112 MCG tablet, Take 100 mcg by mouth daily before breakfast. , Disp: , Rfl:  .  Magnesium  400 MG CAPS, Take by mouth., Disp: , Rfl:  .  Melatonin 3 MG CAPS, Take by mouth., Disp: , Rfl:  .  NON FORMULARY, Take 1 tablet by mouth daily. Hair, Skin, and Nails Vitamin, Disp: , Rfl:  .  omeprazole (PRILOSEC) 20 MG capsule, Take 1 capsule (20 mg total) by mouth daily., Disp: 30 capsule, Rfl: 6 .  Prenatal Vit-Fe Fumarate-FA (PRENATAL MULTIVITAMIN) TABS tablet, Take 1 tablet by mouth daily at 12 noon., Disp: , Rfl:  .  pyridOXINE (VITAMIN B-6) 50 MG tablet, Take 1 tablet (50 mg total) by mouth 2 (two) times daily., Disp: 60 tablet, Rfl: 1 .  VITAMIN E PO, Take by mouth 2 (two) times daily., Disp: ,  Rfl:  .  Zinc 25 MG TABS, Take 1 tablet by mouth daily., Disp: , Rfl:   Current Facility-Administered Medications:  .  ferumoxytol (FERAHEME) 510 mg in sodium chloride 0.9 % 100 mL IVPB, 510 mg, Intravenous, Once, Emelda FearFerguson, Cyndi LennertJohn V, MD Social History: Reviewed -  reports that  has never smoked. she has never used smokeless tobacco.   Physical Assessment:   Vitals:   12/21/17 0852  BP: 122/62  Pulse: 84  Weight: 246 lb 8 oz (111.8 kg)  Body mass index is 36.4 kg/m.           Physical Examination:   General appearance: alert, well appearing, and in no distress  Mental status: alert, oriented to person, place, and time  Skin: warm & dry   Extremities: Edema: None    Cardiovascular: normal heart rate noted  Respiratory: normal respiratory effort, no distress  Abdomen: gravid, soft, non-tender  Pelvic: Cervical exam performed  Dilation: 2 Effacement (%): Thick Station: -2  Fetal Status: Fetal Heart Rate (bpm): 152 Fundal Height: 38 cm Movement: Present Presentation: Vertex  Fetal Surveillance Testing today: doppler  Results for orders placed or performed in visit on 12/21/17 (from the past 24 hour(s))  POCT Urinalysis Dipstick   Collection Time: 12/21/17  8:52 AM  Result Value Ref Range   Color, UA     Clarity, UA     Glucose, UA neg    Bilirubin, UA     Ketones, UA neg    Spec Grav, UA  1.010 - 1.025   Blood, UA neg    pH, UA  5.0 - 8.0   Protein, UA neg    Urobilinogen, UA  0.2 or 1.0 E.U./dL   Nitrite, UA neg    Leukocytes, UA Negative Negative   Appearance     Odor      Assessment & Plan:  1) High-risk pregnancy G2P1001 at 1162w4d with an Estimated Date of Delivery: 12/31/17   2) hx PE, on Lovenox 40mg  daily. Discused w/Dr. Jean RosenthalJackson (anesthesia).  Pt CAN NOT get epidural or spinal until >12 hours after last injection, and this is w/lovenox or heparin.  No recommendations for when to stop lovenox at this time.  If pt gets IOL, obviously stop it prior to.   3) Anemia,  JW, improved  Labs/procedures today: HgB 10.1  Medications: lovenox 40mg  sq daily, FeSO4 325mg  BID  Treatment Plan:  Weekly visits  Follow-up: Return in about 1 week (around 12/28/2017) for LROB.  Orders Placed This Encounter  Procedures  . POCT Urinalysis Dipstick   Jacklyn ShellFrances Cresenzo-Dishmon CNM 12/21/2017 9:31 AM

## 2017-12-28 ENCOUNTER — Encounter (HOSPITAL_COMMUNITY): Payer: Self-pay | Admitting: *Deleted

## 2017-12-28 ENCOUNTER — Encounter: Payer: Self-pay | Admitting: Women's Health

## 2017-12-28 ENCOUNTER — Telehealth (HOSPITAL_COMMUNITY): Payer: Self-pay | Admitting: *Deleted

## 2017-12-28 ENCOUNTER — Ambulatory Visit (INDEPENDENT_AMBULATORY_CARE_PROVIDER_SITE_OTHER): Payer: Commercial Managed Care - PPO | Admitting: Women's Health

## 2017-12-28 VITALS — BP 118/78 | HR 78 | Wt 249.0 lb

## 2017-12-28 DIAGNOSIS — I2699 Other pulmonary embolism without acute cor pulmonale: Secondary | ICD-10-CM

## 2017-12-28 DIAGNOSIS — Z331 Pregnant state, incidental: Secondary | ICD-10-CM

## 2017-12-28 DIAGNOSIS — O0990 Supervision of high risk pregnancy, unspecified, unspecified trimester: Secondary | ICD-10-CM

## 2017-12-28 DIAGNOSIS — O99283 Endocrine, nutritional and metabolic diseases complicating pregnancy, third trimester: Secondary | ICD-10-CM

## 2017-12-28 DIAGNOSIS — E039 Hypothyroidism, unspecified: Secondary | ICD-10-CM

## 2017-12-28 DIAGNOSIS — O34219 Maternal care for unspecified type scar from previous cesarean delivery: Secondary | ICD-10-CM

## 2017-12-28 DIAGNOSIS — D649 Anemia, unspecified: Secondary | ICD-10-CM

## 2017-12-28 DIAGNOSIS — Z1389 Encounter for screening for other disorder: Secondary | ICD-10-CM

## 2017-12-28 DIAGNOSIS — O99013 Anemia complicating pregnancy, third trimester: Secondary | ICD-10-CM

## 2017-12-28 DIAGNOSIS — Z3A39 39 weeks gestation of pregnancy: Secondary | ICD-10-CM

## 2017-12-28 DIAGNOSIS — O480 Post-term pregnancy: Secondary | ICD-10-CM

## 2017-12-28 DIAGNOSIS — Z3483 Encounter for supervision of other normal pregnancy, third trimester: Secondary | ICD-10-CM

## 2017-12-28 LAB — POCT URINALYSIS DIPSTICK
Blood, UA: NEGATIVE
Glucose, UA: NEGATIVE
Ketones, UA: NEGATIVE
Leukocytes, UA: NEGATIVE
Nitrite, UA: NEGATIVE
Protein, UA: NEGATIVE

## 2017-12-28 NOTE — Telephone Encounter (Signed)
Preadmission screen  

## 2017-12-28 NOTE — Progress Notes (Signed)
HIGH-RISK PREGNANCY VISIT Patient name: Morgan CruiseJasmine S Schoenbeck MRN [id number]  Date of birth: July 02, 1986 Chief Complaint:   Routine Prenatal Visit  History of Present Illness:   Morgan CruiseJasmine S Kievit is a 32 y.o. 222P1001 female at 824w4d with an Estimated Date of Delivery: 12/31/17 being seen today for ongoing management of a high-risk pregnancy complicated by h/o PE currently on Lovenox 40mg  daily, prior c/s for breech, Jehovah's witness, anemic, hypothryoidism.  Today she reports no complaints. Contractions: Irritability. Vag. Bleeding: None.  Movement: Present. denies leaking of fluid.  Review of Systems:   Pertinent items are noted in HPI Denies abnormal vaginal discharge w/ itching/odor/irritation, headaches, visual changes, shortness of breath, chest pain, abdominal pain, severe nausea/vomiting, or problems with urination or bowel movements unless otherwise stated above. Pertinent History Reviewed:  Reviewed past medical,surgical, social, obstetrical and family history.  Reviewed problem list, medications and allergies. Physical Assessment:   Vitals:   12/28/17 0836  BP: 118/78  Pulse: 78  Weight: 249 lb (112.9 kg)  Body mass index is 36.77 kg/m.           Physical Examination:   General appearance: alert, well appearing, and in no distress  Mental status: alert, oriented to person, place, and time  Skin: warm & dry   Extremities: Edema: None    Cardiovascular: normal heart rate noted  Respiratory: normal respiratory effort, no distress  Abdomen: gravid, soft, non-tender  Pelvic: Cervical exam performed  Dilation: 3 Effacement (%): Thick Station: -2 Wants membranes swept, discussed risks/benefits, not likely to be effective w/ thick cx, wants to try, so membranes swept  Fetal Status: Fetal Heart Rate (bpm): 156 Fundal Height: 38 cm Movement: Present Presentation: Vertex  Fetal Surveillance Testing today: doppler   Results for orders placed or performed in visit on 12/28/17 (from  the past 24 hour(s))  POCT Urinalysis Dipstick   Collection Time: 12/28/17  8:41 AM  Result Value Ref Range   Color, UA     Clarity, UA     Glucose, UA neg    Bilirubin, UA     Ketones, UA neg    Spec Grav, UA  1.010 - 1.025   Blood, UA neg    pH, UA  5.0 - 8.0   Protein, UA neg    Urobilinogen, UA  0.2 or 1.0 E.U./dL   Nitrite, UA neg    Leukocytes, UA Negative Negative   Appearance     Odor      Assessment & Plan:  1) High-risk pregnancy G2P1001 at 384w4d with an Estimated Date of Delivery: 12/31/17   2) H/O PE, currently on Lovenox 40mg  daily  3) Prior c/s for breech, wants TOLAC, consent already signed, has doula, birth plan   4) Anemic, Jehovah's witness> received IV Feraheme week before last  5) Hypothyroidism> synthroid  Meds: No orders of the defined types were placed in this encounter.   Labs/procedures today: sve, membrane sweep  Treatment Plan:  IOL scheduled for 3/31 @ 0700 for postdates,  IOL form faxed via Epic and orders placed   Reviewed: Term labor symptoms and general obstetric precautions including but not limited to vaginal bleeding, contractions, leaking of fluid and fetal movement were reviewed in detail with the patient.  All questions were answered.  Follow-up: Return in about 6 days (around 01/03/2018) for US:BPP, HROB.  Orders Placed This Encounter  Procedures  . US FETAL BPP WO NON STRESS  . POCT Urinalysis Dipstick   Cheral MarkerKimberly R Minor Iden CNM, WHNP-BC  12/28/2017 4:55 PM

## 2017-12-28 NOTE — Patient Instructions (Addendum)
Morgan Nielsen, I greatly value your feedback.  If you receive a survey following your visit with [place] today, we appreciate you taking the time to fill it out.  Thanks, Morgan Nielsen, CNM, WHNP-BC  Your induction is scheduled for 3/31 @ 7:00am. Go to [facility] hospital, Maternity Admissions Unit (Emergency) entrance and let them know you are there to be induced. They will send someone from Labor & Delivery to come get you.     Call the office [phone]) or go to [facility] if:  You begin to have strong, frequent contractions  Your water breaks.  Sometimes it is a big gush of fluid, sometimes it is just a trickle that keeps getting your panties wet or running down your legs  You have vaginal bleeding.  It is normal to have a small amount of spotting if your cervix was checked.   You don't feel your baby moving like normal.  If you don't, get you something to eat and drink and lay down and focus on feeling your baby move.  You should feel at least 10 movements in 2 hours.  If you don't, you should call the office or go to [facility].     [facility] Contractions Contractions of the uterus can occur throughout pregnancy, but they are not always a sign that you are in labor. You may have practice contractions called Braxton Hicks contractions. These false labor contractions are sometimes confused with true labor. What are Morgan Nielsen contractions? Braxton Hicks contractions are tightening movements that occur in the muscles of the uterus before labor. Unlike true labor contractions, these contractions do not result in opening (dilation) and thinning of the cervix. Toward the end of pregnancy (32-34 weeks), Braxton Hicks contractions can happen more often and may become stronger. These contractions are sometimes difficult to tell apart from true labor because they can be very uncomfortable. You should not feel embarrassed if you go to the hospital with false labor. Sometimes, the only way  to tell if you are in true labor is for your health care provider to look for changes in the cervix. The health care provider will do a physical exam and may monitor your contractions. If you are not in true labor, the exam should show that your cervix is not dilating and your water has not broken. If there are other health problems associated with your pregnancy, it is completely safe for you to be sent home with false labor. You may continue to have Braxton Hicks contractions until you go into true labor. How to tell the difference between true labor and false labor True labor  Contractions last 30-70 seconds.  Contractions become very regular.  Discomfort is usually felt in the top of the uterus, and it spreads to the lower abdomen and low back.  Contractions do not go away with walking.  Contractions usually become more intense and increase in frequency.  The cervix dilates and gets thinner. False labor  Contractions are usually shorter and not as strong as true labor contractions.  Contractions are usually irregular.  Contractions are often felt in the front of the lower abdomen and in the groin.  Contractions may go away when you walk around or change positions while lying down.  Contractions get weaker and are shorter-lasting as time goes on.  The cervix usually does not dilate or become thin. Follow these instructions at home:  Take over-the-counter and prescription medicines only as told by your health care provider.  Keep  up with your usual exercises and follow other instructions from your health care provider.  Eat and drink lightly if you think you are going into labor.  If Braxton Hicks contractions are making you uncomfortable: ? Change your position from lying down or resting to walking, or change from walking to resting. ? Sit and rest in a tub of warm water. ? Drink enough fluid to keep your urine pale yellow. Dehydration may cause these contractions. ? Do  slow and deep breathing several times an hour.  Keep all follow-up prenatal visits as told by your health care provider. This is important. Contact a health care provider if:  You have a fever.  You have continuous pain in your abdomen. Get help right away if:  Your contractions become stronger, more regular, and closer together.  You have fluid leaking or gushing from your vagina.  You pass blood-tinged mucus (bloody show).  You have bleeding from your vagina.  You have low back pain that you never had before.  You feel your baby's head pushing down and causing pelvic pressure.  Your baby is not moving inside you as much as it used to. Summary  Contractions that occur before labor are called Braxton Hicks contractions, false labor, or practice contractions.  Braxton Hicks contractions are usually shorter, weaker, farther apart, and less regular than true labor contractions. True labor contractions usually become progressively stronger and regular and they become more frequent.  Manage discomfort from [facility] contractions by changing position, resting in a warm bath, drinking plenty of water, or practicing deep breathing. This information is not intended to replace advice given to you by your health care provider. Make sure you discuss any questions you have with your health care provider. Document Released: 02/09/2017 Document Revised: 02/09/2017 Document Reviewed: 02/09/2017 Elsevier Interactive Patient Education  2018 [employer]

## 2017-12-28 NOTE — Treatment Plan (Signed)
Induction Assessment Scheduling Form Fax to [facility]:  [phone]  Morgan CruiseJasmine S Nielsen                                                                                   DOB:  16-Jul-1986                                                            MRN:  [id number]                                                                     Phone #:   [phone]                         Provider:  [facility]  GP:  [id number]                                                            Estimated Date of Delivery: 12/31/17  Dating Criteria: LMP c/w 8wk u/s    Medical Indications for induction:  postdates Admission Date/Time:  3/31 @0700  Gestational age on admission:  41.0   Filed Weights   12/28/17 0836  Weight: 249 lb (112.9 kg)   HIV:  Non Reactive (12/27 0914) GBS: Negative (02/28 1200)  3/th/-2, vtx   Method of induction(proposed):  Pitocin/arom   Scheduling Provider Signature:  Cheral MarkerKimberly R Booker, CNM                                            Today's Date:  12/28/2017

## 2017-12-31 ENCOUNTER — Inpatient Hospital Stay (HOSPITAL_COMMUNITY)
Admission: AD | Admit: 2017-12-31 | Payer: Commercial Managed Care - PPO | Source: Ambulatory Visit | Admitting: Obstetrics & Gynecology

## 2018-01-03 ENCOUNTER — Ambulatory Visit (INDEPENDENT_AMBULATORY_CARE_PROVIDER_SITE_OTHER): Payer: Commercial Managed Care - PPO

## 2018-01-03 ENCOUNTER — Ambulatory Visit (INDEPENDENT_AMBULATORY_CARE_PROVIDER_SITE_OTHER): Payer: Commercial Managed Care - PPO | Admitting: Obstetrics and Gynecology

## 2018-01-03 ENCOUNTER — Encounter: Payer: Self-pay | Admitting: Obstetrics and Gynecology

## 2018-01-03 VITALS — BP 126/60 | HR 74 | Wt 248.0 lb

## 2018-01-03 DIAGNOSIS — Z1389 Encounter for screening for other disorder: Secondary | ICD-10-CM

## 2018-01-03 DIAGNOSIS — E039 Hypothyroidism, unspecified: Secondary | ICD-10-CM

## 2018-01-03 DIAGNOSIS — O99283 Endocrine, nutritional and metabolic diseases complicating pregnancy, third trimester: Secondary | ICD-10-CM

## 2018-01-03 DIAGNOSIS — Z6791 Unspecified blood type, Rh negative: Secondary | ICD-10-CM

## 2018-01-03 DIAGNOSIS — O99013 Anemia complicating pregnancy, third trimester: Secondary | ICD-10-CM | POA: Diagnosis not present

## 2018-01-03 DIAGNOSIS — O480 Post-term pregnancy: Secondary | ICD-10-CM

## 2018-01-03 DIAGNOSIS — O26899 Other specified pregnancy related conditions, unspecified trimester: Secondary | ICD-10-CM

## 2018-01-03 DIAGNOSIS — O34219 Maternal care for unspecified type scar from previous cesarean delivery: Secondary | ICD-10-CM | POA: Diagnosis not present

## 2018-01-03 DIAGNOSIS — Z331 Pregnant state, incidental: Secondary | ICD-10-CM

## 2018-01-03 DIAGNOSIS — O0990 Supervision of high risk pregnancy, unspecified, unspecified trimester: Secondary | ICD-10-CM

## 2018-01-03 DIAGNOSIS — Z3A40 40 weeks gestation of pregnancy: Secondary | ICD-10-CM

## 2018-01-03 DIAGNOSIS — Z3483 Encounter for supervision of other normal pregnancy, third trimester: Secondary | ICD-10-CM

## 2018-01-03 DIAGNOSIS — D649 Anemia, unspecified: Secondary | ICD-10-CM

## 2018-01-03 DIAGNOSIS — O09293 Supervision of pregnancy with other poor reproductive or obstetric history, third trimester: Secondary | ICD-10-CM | POA: Diagnosis not present

## 2018-01-03 LAB — POCT URINALYSIS DIPSTICK
Blood, UA: NEGATIVE
Glucose, UA: NEGATIVE
Ketones, UA: NEGATIVE
Leukocytes, UA: NEGATIVE
Nitrite, UA: NEGATIVE
Protein, UA: NEGATIVE

## 2018-01-03 NOTE — Progress Notes (Signed)
US 40+3 wks,cephalic,fhr 159 bpm,posterior pl gr 3,BPP 8/8,AFI 10 cm,bilat adnexa's wnl

## 2018-01-03 NOTE — Progress Notes (Signed)
HIGH-RISK PREGNANCY VISIT Patient name: Morgan Nielsen MRN [id number]  Date of birth: [birth year] Chief Complaint:   High Risk Gestation ([place] today)  History of Present Illness:   Morgan Nielsen is a 32 y.o. G84P1001 female at [redacted]w[redacted]d with an Estimated Date of Delivery: 12/31/17 being seen today for ongoing management of a high-risk pregnancy complicated by h/o PE currently on Lovenox 40mg  daily, prior c/s for breech, Jehovah's witness, anemic s/p Feraheme early March, hypothryoidism   Today she reports no complaints. Last hemoglobin check was 10.1 about 2 weeks ago. She has a few questions about her induction scheduled for this Sunday. Contractions: Irregular. Vag. Bleeding: None.  Movement: Present. denies leaking of fluid.  Review of Systems:   Pertinent items are noted in HPI Denies abnormal vaginal discharge w/ itching/odor/irritation, headaches, visual changes, shortness of breath, chest pain, abdominal pain, severe nausea/vomiting, or problems with urination or bowel movements unless otherwise stated above. Pertinent History Reviewed:  Reviewed past medical,surgical, social, obstetrical and family history.  Reviewed problem list, medications and allergies. Physical Assessment:   Vitals:   01/03/18 1016  BP: 126/60  Pulse: 74  Weight: 248 lb (112.5 kg)  Body mass index is 36.62 kg/m.           Physical Examination:   General appearance: alert, well appearing, and in no distress and oriented to person, place, and time  Mental status: alert, oriented to person, place, and time, normal mood, behavior, speech, dress, motor activity, and thought processes  Skin: warm & dry   Extremities: Edema: None    Cardiovascular: normal heart rate noted  Respiratory: normal respiratory effort, no distress  Abdomen: gravid, soft, non-tender  Pelvic: Cervical exam performed  Dilation: 2    soft and elastic, Wants membranes stripped, so membranes swept  Fetal Status: Fetal Heart Rate (bpm):  120 Fundal Height: 40 cm Movement: Present Presentation: Vertex  Fetal Surveillance Testing today: Doppler, BPP  Results for orders placed or performed in visit on 01/03/18 (from the past 24 hour(s))  POCT urinalysis dipstick   Collection Time: 01/03/18 10:17 AM  Result Value Ref Range   Color, UA     Clarity, UA     Glucose, UA neg    Bilirubin, UA     Ketones, UA neg    Spec Grav, UA  1.010 - 1.025   Blood, UA neg    pH, UA  5.0 - 8.0   Protein, UA neg    Urobilinogen, UA  0.2 or 1.0 E.U./dL   Nitrite, UA neg    Leukocytes, UA Negative Negative   Appearance     Odor      Assessment & Plan:  1) High-risk pregnancy G2P1001 at [redacted]w[redacted]d with an Estimated Date of Delivery: 12/31/17   2) H/o PE, stable, currently on Lovenox 40mg  daily  3) Prior c/s for breech, wants TOLAC, consent already signed, has doula and birth plan                                          CURRENTLY VERTEX WITH COMPOUND PRESENTATION VERTEX Nathaniel Man. 4) Anemic, Jehovah's witness, has received IV Feraheme   5) Hypothyroidism, on Synthroid  Meds: No orders of the defined types were placed in this encounter.   Labs/procedures today: U/S BPP [place] 40+3 wks,cephalic,fhr 159 bpm,posterior pl gr 3,BPP 8/8,AFI 10 cm,bilat adnexa's wnl  Treatment Plan:  IOL scheduled for 3/31 @ 0700 for postdates  Reviewed: Term labor symptoms and general obstetric precautions including but not limited to vaginal bleeding, contractions, leaking of fluid and fetal movement were reviewed in detail with the patient.  All questions were answered. LENGTHY DISCUSSION OF INDUCTION TECHNIQUES INCLUDING FOLEY BULB / PITOCIN.  CYTOTEC NOT AN OPTION DUE TO PRIOR C/S.EXPLAINED.  Follow-up: Return in about 1 month (around 01/31/2018) for PP Visit.  Orders Placed This Encounter  Procedures  . POCT urinalysis dipstick    By signing my name below, I, Izna Ahmed, attest that this documentation has been prepared under the direction and in the  presence of Tilda BurrowFerguson, Deaisa Merida V, MD. Electronically Signed: Redge GainerIzna Ahmed, Medical Scribe. 01/03/18. 11:24 AM.  I personally performed the services described in this documentation, which was SCRIBED in my presence. The recorded information has been reviewed and considered accurate. It has been edited as necessary during review. Tilda BurrowJohn V Addeline Calarco, MD

## 2018-01-07 ENCOUNTER — Inpatient Hospital Stay (HOSPITAL_COMMUNITY)
Admission: RE | Admit: 2018-01-07 | Discharge: 2018-01-10 | DRG: 807 | Disposition: A | Discharge status: Home / Self Care | Payer: Commercial Managed Care - PPO | Source: Ambulatory Visit | Attending: Obstetrics & Gynecology | Admitting: Obstetrics & Gynecology

## 2018-01-07 ENCOUNTER — Encounter (HOSPITAL_COMMUNITY): Payer: Self-pay

## 2018-01-07 DIAGNOSIS — O34219 Maternal care for unspecified type scar from previous cesarean delivery: Secondary | ICD-10-CM | POA: Diagnosis present

## 2018-01-07 DIAGNOSIS — O9902 Anemia complicating childbirth: Secondary | ICD-10-CM | POA: Diagnosis present

## 2018-01-07 DIAGNOSIS — O99844 Bariatric surgery status complicating childbirth: Secondary | ICD-10-CM | POA: Diagnosis present

## 2018-01-07 DIAGNOSIS — Z6791 Unspecified blood type, Rh negative: Secondary | ICD-10-CM | POA: Diagnosis not present

## 2018-01-07 DIAGNOSIS — O0990 Supervision of high risk pregnancy, unspecified, unspecified trimester: Secondary | ICD-10-CM

## 2018-01-07 DIAGNOSIS — D649 Anemia, unspecified: Secondary | ICD-10-CM | POA: Diagnosis present

## 2018-01-07 DIAGNOSIS — Z86711 Personal history of pulmonary embolism: Secondary | ICD-10-CM

## 2018-01-07 DIAGNOSIS — F329 Major depressive disorder, single episode, unspecified: Secondary | ICD-10-CM | POA: Diagnosis present

## 2018-01-07 DIAGNOSIS — O480 Post-term pregnancy: Secondary | ICD-10-CM | POA: Diagnosis present

## 2018-01-07 DIAGNOSIS — O26899 Other specified pregnancy related conditions, unspecified trimester: Secondary | ICD-10-CM

## 2018-01-07 DIAGNOSIS — Z3A41 41 weeks gestation of pregnancy: Secondary | ICD-10-CM

## 2018-01-07 DIAGNOSIS — O99284 Endocrine, nutritional and metabolic diseases complicating childbirth: Secondary | ICD-10-CM | POA: Diagnosis present

## 2018-01-07 DIAGNOSIS — O26893 Other specified pregnancy related conditions, third trimester: Secondary | ICD-10-CM | POA: Diagnosis present

## 2018-01-07 DIAGNOSIS — Z789 Other specified health status: Secondary | ICD-10-CM | POA: Diagnosis present

## 2018-01-07 DIAGNOSIS — O99344 Other mental disorders complicating childbirth: Secondary | ICD-10-CM | POA: Diagnosis present

## 2018-01-07 DIAGNOSIS — IMO0001 Reserved for inherently not codable concepts without codable children: Secondary | ICD-10-CM | POA: Diagnosis present

## 2018-01-07 DIAGNOSIS — E039 Hypothyroidism, unspecified: Secondary | ICD-10-CM | POA: Diagnosis present

## 2018-01-07 DIAGNOSIS — Z3A31 31 weeks gestation of pregnancy: Secondary | ICD-10-CM | POA: Diagnosis not present

## 2018-01-07 DIAGNOSIS — I2699 Other pulmonary embolism without acute cor pulmonale: Secondary | ICD-10-CM | POA: Diagnosis present

## 2018-01-07 LAB — CBC
HCT: 34.2 % — ABNORMAL LOW (ref 36.0–46.0)
Hemoglobin: 11.3 g/dL — ABNORMAL LOW (ref 12.0–15.0)
MCH: 28.5 pg (ref 26.0–34.0)
MCHC: 33 g/dL (ref 30.0–36.0)
MCV: 86.4 fL (ref 78.0–100.0)
Platelets: 173 10*3/uL (ref 150–400)
RBC: 3.96 MIL/uL (ref 3.87–5.11)
RDW: 16 % — ABNORMAL HIGH (ref 11.5–15.5)
WBC: 10.3 10*3/uL (ref 4.0–10.5)

## 2018-01-07 LAB — NO BLOOD PRODUCTS

## 2018-01-07 LAB — TYPE AND SCREEN
ABO/RH(D): B NEG
Antibody Screen: POSITIVE

## 2018-01-07 LAB — RPR: RPR Ser Ql: NONREACTIVE

## 2018-01-07 MED ORDER — OXYCODONE-ACETAMINOPHEN 5-325 MG PO TABS: 2.0000 | ORAL_TABLET | ORAL | Status: DC | PRN

## 2018-01-07 MED ORDER — OXYTOCIN 40 UNITS IN LACTATED RINGERS INFUSION - SIMPLE MED
1.0000 m[IU]/min | INTRAVENOUS | Status: DC
  Administered 2018-01-07: 2 m[IU]/min via INTRAVENOUS
  Administered 2018-01-07 – 2018-01-08 (×2): 20 m[IU]/min via INTRAVENOUS
  Administered 2018-01-08: 12 m[IU]/min via INTRAVENOUS
  Filled 2018-01-07 (×3): qty 1000

## 2018-01-07 MED ORDER — FLEET ENEMA 7-19 GM/118ML RE ENEM: 1.0000 | ENEMA | RECTAL | Status: DC | PRN

## 2018-01-07 MED ORDER — OXYTOCIN 40 UNITS IN LACTATED RINGERS INFUSION - SIMPLE MED: 2.5000 [IU]/h | INTRAVENOUS | Status: DC

## 2018-01-07 MED ORDER — OXYCODONE-ACETAMINOPHEN 5-325 MG PO TABS: 1.0000 | ORAL_TABLET | ORAL | Status: DC | PRN

## 2018-01-07 MED ORDER — TERBUTALINE SULFATE 1 MG/ML IJ SOLN
0.2500 mg | Freq: Once | INTRAMUSCULAR | Status: DC | PRN
  Filled 2018-01-07: qty 1

## 2018-01-07 MED ORDER — ACETAMINOPHEN 325 MG PO TABS: 650.0000 mg | ORAL_TABLET | ORAL | Status: DC | PRN

## 2018-01-07 MED ORDER — OXYTOCIN BOLUS FROM INFUSION: 500.0000 mL | Freq: Once | INTRAVENOUS | Status: DC

## 2018-01-07 MED ORDER — LACTATED RINGERS IV SOLN
INTRAVENOUS | Status: DC
  Administered 2018-01-07 – 2018-01-08 (×3): via INTRAVENOUS

## 2018-01-07 MED ORDER — ONDANSETRON HCL 4 MG/2ML IJ SOLN
4.0000 mg | Freq: Four times a day (QID) | INTRAMUSCULAR | Status: DC | PRN
  Administered 2018-01-08: 4 mg via INTRAVENOUS
  Filled 2018-01-07: qty 2

## 2018-01-07 MED ORDER — FENTANYL CITRATE (PF) 100 MCG/2ML IJ SOLN
50.0000 ug | INTRAMUSCULAR | Status: DC | PRN
  Administered 2018-01-08: 50 ug via INTRAVENOUS
  Filled 2018-01-07: qty 2

## 2018-01-07 MED ORDER — SOD CITRATE-CITRIC ACID 500-334 MG/5ML PO SOLN: 30.0000 mL | ORAL | Status: DC | PRN

## 2018-01-07 MED ORDER — HYDROXYZINE HCL 50 MG PO TABS
50.0000 mg | ORAL_TABLET | Freq: Four times a day (QID) | ORAL | Status: DC | PRN
  Filled 2018-01-07: qty 1

## 2018-01-07 MED ORDER — LIDOCAINE HCL (PF) 1 % IJ SOLN
30.0000 mL | INTRAMUSCULAR | Status: DC | PRN
  Filled 2018-01-07: qty 30

## 2018-01-07 MED ORDER — LACTATED RINGERS IV SOLN
500.0000 mL | INTRAVENOUS | Status: DC | PRN
  Administered 2018-01-08: 500 mL via INTRAVENOUS

## 2018-01-07 NOTE — Anesthesia Pain Management Evaluation Note (Signed)
  CRNA Pain Management Visit Note  Patient: Morgan Nielsen, 32 y.o., female  "[facility]. We have an anesthesia team available at all times to provide care throughout the hospital, including epidural management and anesthesia for C-section. I don't know your plan for the delivery whether it a natural birth, water birth, IV sedation, nitrous supplementation, doula or epidural, but we want to meet your pain goals."   1.Was your pain managed to your expectations on prior hospitalizations?   Yes   2.What is your expectation for pain management during this hospitalization?     IV pain meds  3.How can we help you reach that goal? Support prn  Record the patient's initial score and the patient's pain goal.   Pain: 0  Pain Goal: 6 [facility] wants you to be able to say your pain was always managed very well.  [facility] SystemWRINKLE,Janeane Cozart 01/07/2018

## 2018-01-07 NOTE — Progress Notes (Signed)
Labor Progress Note  Morgan CruiseJasmine S Nielsen is a 32 y.o. G2P1001 at 6323w0d  admitted for induction of labor due to Post dates.   S: Doing well. Resting  O:  BP (!) 141/79   Pulse 73   Temp 98.6 F (37 C) (Oral)   Resp 16   Ht 5\' 9"  (1.753 m)   Wt 114.3 kg (251 lb 14.4 oz)   LMP 03/26/2017 (Exact Date)   BMI 37.20 kg/m   No intake/output data recorded.  FHT:  FHR: 145 bpm, variability: moderate,  accelerations:  Present,  decelerations:  Absent UC:   irregular, every 3-5 minutes SVE:   Dilation: 3 Effacement (%): 50 Station: Ballotable Exam by:: lee  Pitocin @ 10 mu/min  Labs: Lab Results  Component Value Date   WBC 10.3 01/07/2018   HGB 11.3 (L) 01/07/2018   HCT 34.2 (L) 01/07/2018   MCV 86.4 01/07/2018   PLT 173 01/07/2018    Assessment / Plan: 32 y.o. G2P1001 6323w0d. Not in labor Induction of labor due to postterm. TOLAC  Labor: Progressing normally, continue pitocin Fetal Wellbeing:  Category I Pain Control:  Labor support without medications Anticipated MOD:  NSVD  Expectant management   Caryl AdaJazma Phelps, DO OB Fellow [facility]

## 2018-01-07 NOTE — H&P (Signed)
Obstetric History and Physical  Morgan Nielsen is a 32 y.o. G2P1001 with IUP at [redacted]w[redacted]d presenting for IOL for postdates. Patient has had a high risk pregnancy complicated by h/o PE in 2012 s/p birth control, h/o c-section due to breech, hypothyroidism on Synthroid, h/o gHTN, Rh negative, depression on Lexapro, anemia s/p Ferriheme, and h/o bariatric surgery, and a Jehovah's witness. Patient states she has been having  none contractions, none vaginal bleeding, intact membranes, with active fetal movement.    Prenatal Course Source of Care: FT with onset of care at 6.5 weeks Dating: By LMP --->  Estimated Date of Delivery: 12/31/17 Pregnancy complications or risks: Patient Active Problem List   Diagnosis Date Noted  . Post-dates pregnancy 01/07/2018  . Patient is Jehovah's Witness 10/26/2017  . Anemia complicating pregnancy, third trimester 10/26/2017  . History of bariatric surgery 06/23/2017  . History of cesarean section complicating pregnancy 06/16/2017  . Depression affecting pregnancy 06/16/2017  . Supervision of high risk pregnancy, antepartum 05/16/2017  . Rh negative state in antepartum period 05/16/2017  . History of gestational hypertension 12/07/2013  . Religious or spiritual beliefs affecting medical care 09/13/2011  . Hypothyroidism 09/13/2011  . Obese 09/13/2011  . Family history of blood clots 09/13/2011  . Family history of blood disorder 09/13/2011  . Pulmonary embolism (HCC) 05/09/2011   She plans to breastfeed She desires natural family planning (NFP) for postpartum contraception.   Sono:    @[redacted]w[redacted]d , CWD, normal anatomy, cephalic presentation, posterior placenta, BPP 8/8, AFI 10cm,  @18w1  251g  Prenatal labs and studies: ABO, Rh: B/Negative/-- (08/03 0909) Antibody: Negative (12/27 0914) Rubella: 4.14 (08/03 0909) RPR: Non Reactive (12/27 0914)  HBsAg: Negative (08/03 0909)  HIV: Non Reactive (12/27 0914)  ZOX:WRUEAVWU (02/28 1200) 2 hr Glucola   normal Genetic screening normal Anatomy US normal  Prenatal Transfer Tool  Maternal Diabetes: No Genetic Screening: Normal Maternal Ultrasounds/Referrals: Normal Fetal Ultrasounds or other Referrals:  None Maternal Substance Abuse:  No Significant Maternal Medications:  Meds include: Syntroid, Lexapro, Lovenox Significant Maternal Lab Results: Lab values include: Group B Strep negative, Rh negative  Past Medical History:  Diagnosis Date  . Allergy   . Anemia   . Asthma   . Clotting disorder (HCC)    per bloodwork not clotting disorder  . Family history of blood clots 09/13/2011  . Family history of blood disorder 09/13/2011  . Headache(784.0)    migraines  . Hypertension   . Hypothyroidism 09/13/2011  . Obese 09/13/2011  . Pregnancy induced hypertension   . Pulmonary embolism (HCC) 05/09/2011  . Religious or spiritual beliefs affecting medical care 09/13/2011   doesnt accept blood    Past Surgical History:  Procedure Laterality Date  . BARIATRIC SURGERY  2016  . CESAREAN SECTION N/A 12/07/2013   Procedure: CESAREAN SECTION;  Surgeon: Esmeralda Arthur, MD;  Location: [facility];  Service: Obstetrics;  Laterality: N/A;  . WISDOM TOOTH EXTRACTION    . WRIST SURGERY     left torn cartilage 2007    OB History  Gravida Para Term Preterm AB Living  2 1 1  0 0 1  SAB TAB Ectopic Multiple Live Births  0 0 0 0 1    # Outcome Date GA Lbr Len/2nd Weight Sex Delivery Anes PTL Lv  2 Current           1 Term 12/07/13 [redacted]w[redacted]d  2.945 kg (6 lb 7.9 oz) F CS-LTranv Spinal  LIV    Social History  Socioeconomic History  . Marital status: Married    Spouse name: Not on file  . Number of children: Not on file  . Years of education: Not on file  . Highest education level: Not on file  Occupational History  . Not on file  Social Needs  . Financial resource strain: Not on file  . Food insecurity:    Worry: Not on file    Inability: Not on file  . Transportation needs:    Medical: Not on  file    Non-medical: Not on file  Tobacco Use  . Smoking status: Never Smoker  . Smokeless tobacco: Never Used  Substance and Sexual Activity  . Alcohol use: No    Alcohol/week: 0.5 - 1.0 oz    Types: 1 - 2 Standard drinks or equivalent per week    Frequency: Never  . Drug use: No  . Sexual activity: Yes    Partners: Male    Birth control/protection: None  Lifestyle  . Physical activity:    Days per week: Not on file    Minutes per session: Not on file  . Stress: Not on file  Relationships  . Social connections:    Talks on phone: Not on file    Gets together: Not on file    Attends religious service: Not on file    Active member of club or organization: Not on file    Attends meetings of clubs or organizations: Not on file    Relationship status: Not on file  Other Topics Concern  . Not on file  Social History Narrative  . Not on file    Family History  Problem Relation Age of Onset  . Hyperlipidemia Mother   . Hypertension Mother   . Pulmonary embolism Mother   . Diabetes Father   . Hyperlipidemia Father   . Hypertension Father   . Cancer Maternal Grandfather        prostate  . Pulmonary embolism Maternal Aunt   . Deep vein thrombosis Maternal Uncle   . Cancer Paternal Aunt        uterine  . Pulmonary embolism Maternal Aunt   . Pulmonary embolism Maternal Aunt     Facility-Administered Medications Prior to Admission  Medication Dose Route Frequency Provider Last Rate Last Dose  . ferumoxytol (FERAHEME) 510 mg in sodium chloride 0.9 % 100 mL IVPB  510 mg Intravenous Once Tilda Burrow, MD       Medications Prior to Admission  Medication Sig Dispense Refill Last Dose  . acetaminophen (TYLENOL) 500 MG tablet Take 1,000 mg by mouth every 6 (six) hours as needed for mild pain or headache.   01/07/2018 at Unknown time  . albuterol (PROVENTIL HFA;VENTOLIN HFA) 108 (90 Base) MCG/ACT inhaler Inhale 1-2 puffs into the lungs every 6 (six) hours as needed for  wheezing or shortness of breath. 1 Inhaler 1 Past Month at Unknown time  . aspirin 81 MG tablet Take 81 mg by mouth daily.   01/06/2018 at Unknown time  . B Complex-C (SUPER B COMPLEX PO) Take 1 tablet by mouth daily.   01/06/2018 at Unknown time  . Calcium Carb-Cholecalciferol (CALCIUM 500 + D3 PO) Take 1 tablet by mouth daily.   01/06/2018 at Unknown time  . enoxaparin (LOVENOX) 40 MG/0.4ML injection Inject 0.4 mLs (40 mg total) into the skin daily. 30 Syringe 6 01/06/2018 at Unknown time  . escitalopram (LEXAPRO) 10 MG tablet Take 1 tablet (10 mg total) by mouth daily. (  Patient taking differently: Take 5 mg by mouth daily. ) 30 tablet 11 01/06/2018 at Unknown time  . ferrous sulfate 325 (65 FE) MG tablet Take 325 mg by mouth daily with breakfast.   01/06/2018 at Unknown time  . levothyroxine (SYNTHROID, LEVOTHROID) 112 MCG tablet Take 100 mcg by mouth daily before breakfast.    01/06/2018 at Unknown time  . Magnesium 400 MG CAPS Take 1 capsule by mouth daily.    01/06/2018 at Unknown time  . Melatonin 3 MG CAPS Take by mouth at bedtime as needed.    Past Week at Unknown time  . NON FORMULARY Take 1 tablet by mouth daily. Hair, Skin, and Nails Vitamin   01/06/2018 at Unknown time  . omeprazole (PRILOSEC) 20 MG capsule Take 1 capsule (20 mg total) by mouth daily. 30 capsule 6 01/06/2018 at Unknown time  . Prenatal Vit-Fe Fumarate-FA (PRENATAL MULTIVITAMIN) TABS tablet Take 1 tablet by mouth daily at 12 noon.   01/06/2018 at Unknown time  . pyridOXINE (VITAMIN B-6) 50 MG tablet Take 1 tablet (50 mg total) by mouth 2 (two) times daily. (Patient taking differently: Take 50 mg by mouth as needed. ) 60 tablet 1 Past Month at Unknown time  . VITAMIN E PO Take by mouth 2 (two) times daily.   01/06/2018 at Unknown time  . Zinc 25 MG TABS Take 1 tablet by mouth daily.   01/06/2018 at Unknown time    Allergies  Allergen Reactions  . Shellfish Allergy Swelling    Review of Systems: Negative except for what is  mentioned in HPI.  Physical Exam: BP (!) 117/48   Pulse 70   Temp 98.6 F (37 C) (Oral)   Resp 16   Ht 5\' 9"  (1.753 m)   Wt 114.3 kg (251 lb 14.4 oz)   LMP 03/26/2017 (Exact Date)   BMI 37.20 kg/m  CONSTITUTIONAL: Well-developed, well-nourished female in no acute distress.  HENT:  Normocephalic, atraumatic, External right and left ear normal. Oropharynx is clear and moist EYES: Conjunctivae and EOM are normal. Pupils are equal, round, and reactive to light. No scleral icterus.  NECK: Normal range of motion, supple, no masses SKIN: Skin is warm and dry. No rash noted. Not diaphoretic. No erythema. No pallor. NEUROLOGIC: Alert and oriented to person, place, and time. Normal reflexes, muscle tone coordination. No cranial nerve deficit noted. PSYCHIATRIC: Normal mood and affect. Normal behavior. Normal judgment and thought content. CARDIOVASCULAR: Normal heart rate noted, regular rhythm RESPIRATORY: Effort and breath sounds normal, no problems with respiration noted ABDOMEN: Soft, nontender, nondistended, gravid. MUSCULOSKELETAL: Normal range of motion. No edema and no tenderness. 2+ distal pulses.  Cervical Exam: Dilation: 3 Effacement (%): 50 Cervical Position: Middle Station: -2 Presentation: Vertex Exam by:: lee   FHT:  Baseline rate 145 bpm   Variability moderate  Accelerations present   Decelerations none Contractions: UI   Pertinent Labs/Studies:   Results for orders placed or performed during the hospital encounter of 01/07/18 (from the past 24 hour(s))  CBC     Status: Abnormal   Collection Time: 01/07/18  7:52 AM  Result Value Ref Range   WBC 10.3 4.0 - 10.5 K/uL   RBC 3.96 3.87 - 5.11 MIL/uL   Hemoglobin 11.3 (L) 12.0 - 15.0 g/dL   HCT 16.1 (L) 09.6 - 04.5 %   MCV 86.4 78.0 - 100.0 fL   MCH 28.5 26.0 - 34.0 pg   MCHC 33.0 30.0 - 36.0 g/dL   RDW 40.9 (  H) 11.5 - 15.5 %   Platelets 173 150 - 400 K/uL    Assessment : Alphonzo CruiseJasmine S Ungar is a 32 y.o. G2P1001 at  375w0d being admitted for induction of labor due to postdates.  Plan: #Labor: Induction of labor with pitocin. Has birth plan.  #Analgesia as needed. Does not desire epidural. #FWB: Reassuring fetal heart tracing.   #GBS negative  #H/o PE: has tested negative for inherited and acquired thrombophilias. On Lovenox this pregnancy. Continue postpartum for 6 weeks.   #Hyporthyroidism: Continue synthroid postpartum.  #H/o c-section. Due to breech. Last op note with mention of partial acreeta (3x3cm). Patient desires TOLAC. TOLAC consent signed.   Delivery plan: Hopeful for vaginal delivery   Caryl AdaJazma Phelps, DO OB Fellow Faculty Practice, [facility] [place] 01/07/2018, 9:16 AM

## 2018-01-07 NOTE — Progress Notes (Signed)
Labor Progress Note  Morgan CruiseJasmine S Wilhide is a 32 y.o. G2P1001 at 6594w0d  admitted for induction of labor due to Post dates.   S: Doing well. Starting to feel more uncomfortable. Feels as though her water broke as she is having leaking.  O:  BP 126/68   Pulse 71   Temp 98.4 F (36.9 C) (Oral)   Resp 16   Ht 5\' 9"  (1.753 m)   Wt 114.3 kg (251 lb 14.4 oz)   LMP 03/26/2017 (Exact Date)   BMI 37.20 kg/m   No intake/output data recorded.  FHT:  FHR: 145 bpm, variability: moderate,  accelerations:  Present,  decelerations:  Absent UC:   regular, every 2-4 minutes SVE:   Dilation: 4 Effacement (%): 60 Station: -3 Exam by:: Alyona Romack,DO SROM: 2200, clear Pitocin @ 22 mu/min  Labs: Lab Results  Component Value Date   WBC 10.3 01/07/2018   HGB 11.3 (L) 01/07/2018   HCT 34.2 (L) 01/07/2018   MCV 86.4 01/07/2018   PLT 173 01/07/2018    Assessment / Plan: 32 y.o. G2P1001 4694w0d. Early labor Induction of labor due to postterm. TOLAC.  Labor: Continue with Pitocin. Patient requesting a pitocin break soon. Now SROM'd Fetal Wellbeing:  Category I Pain Control:  Labor support without medications Anticipated MOD:  NSVD  Expectant management   Caryl AdaJazma Thania Woodlief, DO OB Fellow Center for [facility], [facility]

## 2018-01-07 NOTE — Progress Notes (Signed)
Labor Progress Note  Morgan CruiseJasmine S Nielsen is a 32 y.o. G2P1001 at 6027w0d  admitted for induction of labor due to Post dates.   S: Doing well. Denies pain.  O:  BP 127/69   Pulse 72   Temp 98.6 F (37 C) (Oral)   Resp 16   Ht 5\' 9"  (1.753 m)   Wt 114.3 kg (251 lb 14.4 oz)   LMP 03/26/2017 (Exact Date)   BMI 37.20 kg/m   No intake/output data recorded.  FHT:  FHR: 150 bpm, variability: moderate,  accelerations:  Present,  decelerations:  Absent UC:   regular, every 2-4 minutes SVE:   Dilation: 4 Effacement (%): 60 Station: -3 Exam by:: phelps  Pitocin @ 18 mu/min  Labs: Lab Results  Component Value Date   WBC 10.3 01/07/2018   HGB 11.3 (L) 01/07/2018   HCT 34.2 (L) 01/07/2018   MCV 86.4 01/07/2018   PLT 173 01/07/2018    Assessment / Plan: 32 y.o. G2P1001 2027w0d. Early labor Induction of labor due to postterm. TOLAC.  Labor: Progressing on Pitocin, will continue to increase then AROM  Fetal Wellbeing:  Category I Pain Control:  Labor support without medications Anticipated MOD:  NSVD  Expectant management   Caryl AdaJazma Phelps, DO OB Fellow Center for [facility], [facility], [facility]

## 2018-01-08 ENCOUNTER — Encounter (HOSPITAL_COMMUNITY): Payer: Self-pay | Admitting: Anesthesiology

## 2018-01-08 ENCOUNTER — Other Ambulatory Visit: Payer: Self-pay

## 2018-01-08 ENCOUNTER — Encounter (HOSPITAL_COMMUNITY): Payer: Self-pay

## 2018-01-08 DIAGNOSIS — O480 Post-term pregnancy: Secondary | ICD-10-CM

## 2018-01-08 DIAGNOSIS — Z3A31 31 weeks gestation of pregnancy: Secondary | ICD-10-CM

## 2018-01-08 LAB — CBC
HCT: 36 % (ref 36.0–46.0)
Hemoglobin: 11.8 g/dL — ABNORMAL LOW (ref 12.0–15.0)
MCH: 28.4 pg (ref 26.0–34.0)
MCHC: 32.8 g/dL (ref 30.0–36.0)
MCV: 86.5 fL (ref 78.0–100.0)
Platelets: 169 10*3/uL (ref 150–400)
RBC: 4.16 MIL/uL (ref 3.87–5.11)
RDW: 15.9 % — ABNORMAL HIGH (ref 11.5–15.5)
WBC: 11.5 10*3/uL — ABNORMAL HIGH (ref 4.0–10.5)

## 2018-01-08 MED ORDER — SENNOSIDES-DOCUSATE SODIUM 8.6-50 MG PO TABS
2.0000 | ORAL_TABLET | ORAL | Status: DC
  Administered 2018-01-08 – 2018-01-09 (×2): 2 via ORAL
  Filled 2018-01-08 (×2): qty 2

## 2018-01-08 MED ORDER — FENTANYL 2.5 MCG/ML BUPIVACAINE 1/10 % EPIDURAL INFUSION (WH - ANES): 14.0000 mL/h | INTRAMUSCULAR | Status: DC | PRN

## 2018-01-08 MED ORDER — PRENATAL MULTIVITAMIN CH
1.0000 | ORAL_TABLET | Freq: Every day | ORAL | Status: DC
  Administered 2018-01-08 – 2018-01-10 (×3): 1 via ORAL
  Filled 2018-01-08 (×2): qty 1

## 2018-01-08 MED ORDER — PANTOPRAZOLE SODIUM 40 MG PO TBEC
40.0000 mg | DELAYED_RELEASE_TABLET | Freq: Every day | ORAL | Status: DC
  Administered 2018-01-09 – 2018-01-10 (×2): 40 mg via ORAL
  Filled 2018-01-08 (×2): qty 1

## 2018-01-08 MED ORDER — PHENYLEPHRINE 40 MCG/ML (10ML) SYRINGE FOR IV PUSH (FOR BLOOD PRESSURE SUPPORT)
PREFILLED_SYRINGE | INTRAVENOUS | Status: AC
  Filled 2018-01-08: qty 20

## 2018-01-08 MED ORDER — ONDANSETRON HCL 4 MG/2ML IJ SOLN: 4.0000 mg | INTRAMUSCULAR | Status: DC | PRN

## 2018-01-08 MED ORDER — OXYTOCIN 10 UNIT/ML IJ SOLN
INTRAMUSCULAR | Status: AC
  Administered 2018-01-08: 10 [IU]
  Filled 2018-01-08: qty 1

## 2018-01-08 MED ORDER — EPHEDRINE 5 MG/ML INJ
10.0000 mg | INTRAVENOUS | Status: DC | PRN
  Filled 2018-01-08: qty 2

## 2018-01-08 MED ORDER — COCONUT OIL OIL
1.0000 "application " | TOPICAL_OIL | Status: DC | PRN
  Administered 2018-01-09: 1 via TOPICAL
  Filled 2018-01-08: qty 120

## 2018-01-08 MED ORDER — ONDANSETRON HCL 4 MG PO TABS: 4.0000 mg | ORAL_TABLET | ORAL | Status: DC | PRN

## 2018-01-08 MED ORDER — ENOXAPARIN SODIUM 40 MG/0.4ML ~~LOC~~ SOLN
40.0000 mg | SUBCUTANEOUS | Status: DC
  Administered 2018-01-08 – 2018-01-09 (×2): 40 mg via SUBCUTANEOUS
  Filled 2018-01-08 (×2): qty 0.4

## 2018-01-08 MED ORDER — SIMETHICONE 80 MG PO CHEW: 80.0000 mg | CHEWABLE_TABLET | ORAL | Status: DC | PRN

## 2018-01-08 MED ORDER — DIPHENHYDRAMINE HCL 50 MG/ML IJ SOLN: 12.5000 mg | INTRAMUSCULAR | Status: DC | PRN

## 2018-01-08 MED ORDER — ACETAMINOPHEN 325 MG PO TABS: 650.0000 mg | ORAL_TABLET | ORAL | Status: DC | PRN

## 2018-01-08 MED ORDER — OXYCODONE-ACETAMINOPHEN 5-325 MG PO TABS: 1.0000 | ORAL_TABLET | ORAL | Status: DC | PRN

## 2018-01-08 MED ORDER — FENTANYL 2.5 MCG/ML BUPIVACAINE 1/10 % EPIDURAL INFUSION (WH - ANES)
INTRAMUSCULAR | Status: AC
  Filled 2018-01-08: qty 100

## 2018-01-08 MED ORDER — ESCITALOPRAM OXALATE 5 MG PO TABS
5.0000 mg | ORAL_TABLET | Freq: Every day | ORAL | Status: DC
  Administered 2018-01-08 – 2018-01-10 (×3): 5 mg via ORAL
  Filled 2018-01-08 (×3): qty 1

## 2018-01-08 MED ORDER — ZOLPIDEM TARTRATE 5 MG PO TABS: 5.0000 mg | ORAL_TABLET | Freq: Every evening | ORAL | Status: DC | PRN

## 2018-01-08 MED ORDER — LACTATED RINGERS IV SOLN: 500.0000 mL | Freq: Once | INTRAVENOUS | Status: DC

## 2018-01-08 MED ORDER — DIBUCAINE 1 % RE OINT: 1.0000 "application " | TOPICAL_OINTMENT | RECTAL | Status: DC | PRN

## 2018-01-08 MED ORDER — IBUPROFEN 600 MG PO TABS
600.0000 mg | ORAL_TABLET | Freq: Four times a day (QID) | ORAL | Status: DC
  Administered 2018-01-08 – 2018-01-10 (×9): 600 mg via ORAL
  Filled 2018-01-08 (×8): qty 1

## 2018-01-08 MED ORDER — TETANUS-DIPHTH-ACELL PERTUSSIS 5-2.5-18.5 LF-MCG/0.5 IM SUSP: 0.5000 mL | Freq: Once | INTRAMUSCULAR | Status: DC

## 2018-01-08 MED ORDER — DIPHENHYDRAMINE HCL 25 MG PO CAPS: 25.0000 mg | ORAL_CAPSULE | Freq: Four times a day (QID) | ORAL | Status: DC | PRN

## 2018-01-08 MED ORDER — PHENYLEPHRINE 40 MCG/ML (10ML) SYRINGE FOR IV PUSH (FOR BLOOD PRESSURE SUPPORT)
80.0000 ug | PREFILLED_SYRINGE | INTRAVENOUS | Status: DC | PRN
  Filled 2018-01-08: qty 5

## 2018-01-08 MED ORDER — WITCH HAZEL-GLYCERIN EX PADS: 1.0000 "application " | MEDICATED_PAD | CUTANEOUS | Status: DC | PRN

## 2018-01-08 MED ORDER — BENZOCAINE-MENTHOL 20-0.5 % EX AERO
1.0000 "application " | INHALATION_SPRAY | CUTANEOUS | Status: DC | PRN
  Administered 2018-01-08: 1 via TOPICAL
  Filled 2018-01-08: qty 56

## 2018-01-08 MED ORDER — OXYCODONE-ACETAMINOPHEN 5-325 MG PO TABS: 2.0000 | ORAL_TABLET | ORAL | Status: DC | PRN

## 2018-01-08 MED ORDER — LEVOTHYROXINE SODIUM 100 MCG PO TABS
100.0000 ug | ORAL_TABLET | Freq: Every day | ORAL | Status: DC
  Administered 2018-01-09 – 2018-01-10 (×2): 100 ug via ORAL
  Filled 2018-01-08 (×2): qty 1

## 2018-01-08 NOTE — Progress Notes (Signed)
MOB was referred for history of depression/anxiety. * Referral screened out by Clinical Social Worker because none of the following criteria appear to apply: ~ History of anxiety/depression during this pregnancy, or of post-partum depression. ~ Diagnosis of anxiety and/or depression within last 3 years OR * MOB's symptoms currently being treated with medication and/or therapy. Please contact the Clinical Social Worker if needs arise, by MOB request, or if MOB scores greater than 9/yes to question 10 on Edinburgh Postpartum Depression Screen.  MOB has Rx for Lexapro. 

## 2018-01-08 NOTE — Progress Notes (Signed)
Labor Progress Note  Morgan CruiseJasmine S Nielsen is a 32 y.o. G2P1001 at 7964w0d  admitted for induction of labor due to Post dates.   S: Doing well.   O:  BP (!) 104/52   Pulse 64   Temp 98.6 F (37 C) (Oral)   Resp 18   Ht 5\' 9"  (1.753 m)   Wt 114.3 kg (251 lb 14.4 oz)   LMP 03/26/2017 (Exact Date)   BMI 37.20 kg/m   No intake/output data recorded.  FHT:  FHR: 125 bpm, variability: moderate,  accelerations:  Present,  decelerations:  Absent UC:  occasional SVE:   Dilation: 4 Effacement (%): 60 Station: -3 Exam by:: Morgan CornKia Nielsen, Morgan Nielsen SROM: 2200, clear Pitocin @ 22 mu/min  Labs: Lab Results  Component Value Date   WBC 10.3 01/07/2018   HGB 11.3 (L) 01/07/2018   HCT 34.2 (L) 01/07/2018   MCV 86.4 01/07/2018   PLT 173 01/07/2018    Assessment / Plan: 32 y.o. G2P1001 8664w0d. Not in labor.  Induction of labor due to postterm. TOLAC.  Labor: Patient s/p Pitocin break at midnight. Restart Pitocin.  Fetal Wellbeing:  Category I Pain Control:  Labor support without medications Anticipated MOD:  NSVD  Expectant management   Caryl AdaJazma Phelps, DO OB Fellow Center for [facility], [facility]

## 2018-01-08 NOTE — Lactation Note (Signed)
This note was copied from a baby's chart. Lactation Consultation Note  Patient Name: Morgan Garfield CorneaJasmine Kelson WUJWJ'XToday's Date: 01/08/2018    [facility] Initial Visit:  Attempted to visit with mother for first time to assess breastfeeding .  However, sign on door read, "Do not disturb unless necessary"  Will check back later if time permits.    Maurisio Ruddy R Ketrick Matney 01/08/2018, 7:56 PM

## 2018-01-08 NOTE — Progress Notes (Signed)
Admission nutrition screen triggered for unintentional weight loss > 10 lbs within the last month. PNR indicates no weight loss. Patients chart reviewed and assessed  for nutritional risk. Patient is determined to be at low nutrition  risk.   

## 2018-01-08 NOTE — Anesthesia Preprocedure Evaluation (Deleted)
Anesthesia Evaluation  Patient identified by MRN, date of birth, ID band Patient awake    Reviewed: Allergy & Precautions, H&P , NPO status , Patient's Chart, lab work & pertinent test results  History of Anesthesia Complications Negative for: history of anesthetic complications  Airway Mallampati: II  TM Distance: >3 FB Neck ROM: full    Dental no notable dental hx. (+) Teeth Intact   Pulmonary asthma , PE   Pulmonary exam normal breath sounds clear to auscultation       Cardiovascular hypertension, Normal cardiovascular exam Rhythm:regular Rate:Normal     Neuro/Psych  Headaches, negative psych ROS   GI/Hepatic negative GI ROS, Neg liver ROS,   Endo/Other  Hypothyroidism   Renal/GU negative Renal ROS  negative genitourinary   Musculoskeletal   Abdominal (+) + obese,   Peds  Hematology  (+) anemia ,   Anesthesia Other Findings   Reproductive/Obstetrics (+) Pregnancy                             Anesthesia Physical Anesthesia Plan  ASA: III  Anesthesia Plan: Epidural   Post-op Pain Management:    Induction:   PONV Risk Score and Plan:   Airway Management Planned:   Additional Equipment:   Intra-op Plan:   Post-operative Plan:   Informed Consent: I have reviewed the patients History and Physical, chart, labs and discussed the procedure including the risks, benefits and alternatives for the proposed anesthesia with the patient or authorized representative who has indicated his/her understanding and acceptance.     Plan Discussed with:   Anesthesia Plan Comments:         Anesthesia Quick Evaluation

## 2018-01-08 NOTE — Progress Notes (Addendum)
Morgan CruiseJasmine S Nielsen is a 32 y.o. G2P1001 at 2329w1d by LMP admitted for induction of labor due to Post dates. Due date 3/24.   Subjective:tolerating labor well, still standing.  Baby is known Occiput Posterior, confirmed by u/s. Baby 's back is on the left side of maternal abdomen, toward the maternal kidney.   Objective: risk Factors: TOLAC,  BP 124/69   Pulse 72   Temp 98 F (36.7 C) (Axillary)   Resp 18   Ht 5\' 9"  (1.753 m)   Wt 251 lb 14.4 oz (114.3 kg)   LMP 03/26/2017 (Exact Date)   BMI 37.20 kg/m  No intake/output data recorded. No intake/output data recorded.  FHT:  FHR: 135 bpm, variability: moderate,  accelerations:  Present,  decelerations:  Absent UC:   none, regular, every 4 minutes SVE:   Dilation: 4 Effacement (%): 60 Station: -2 Exam by:: Jaclyn PrimeK Wheatley, RN FSE and IUPC inserted. Cervix dilated manually to 5 cm, /100%/-2 Labs: Lab Results  Component Value Date   WBC 10.3 01/07/2018   HGB 11.3 (L) 01/07/2018   HCT 34.2 (L) 01/07/2018   MCV 86.4 01/07/2018   PLT 173 01/07/2018    Assessment / Plan: Induction of labor due to postterm,  progressing well on pitocin Slow progress, associated with Left occiput Posterior presentation TOLAC (prior breech) Distant history of DVT HX Gastric bypass Jehovah's witness.  Plan : Anticipate delivery today, uncertain prognosis for SVD given the OP presentation at 41+wk. Labor: Progressing normally Preeclampsia:   Fetal Wellbeing:  Category I Pain Control:  Labor support without medications I/D:  n/a Anticipated MOD:  uncertain  Tilda BurrowJohn V Delana Manganello 01/08/2018, 5:46 AM

## 2018-01-09 MED ORDER — RHO D IMMUNE GLOBULIN 1500 UNIT/2ML IJ SOSY
300.0000 ug | PREFILLED_SYRINGE | Freq: Once | INTRAMUSCULAR | Status: AC
  Administered 2018-01-09: 300 ug via INTRAMUSCULAR
  Filled 2018-01-09: qty 2

## 2018-01-09 NOTE — Progress Notes (Signed)
Post Partum Day 1 Subjective: no complaints, up ad lib, voiding, tolerating PO and + flatus  Objective: Blood pressure 117/64, pulse 69, temperature 98.2 F (36.8 C), temperature source Oral, resp. rate 18, height 5\' 9"  (1.753 m), weight 114.3 kg (251 lb 15.8 oz), last menstrual period 03/26/2017, SpO2 100 %, unknown if currently breastfeeding.  Physical Exam:  General: alert and cooperative Lochia: appropriate Uterine Fundus: firm Incision: na DVT Evaluation: No evidence of DVT seen on physical exam.  Recent Labs    01/07/18 0752 01/08/18 0825  HGB 11.3* 11.8*  HCT 34.2* 36.0    Assessment/Plan: Plan for discharge tomorrow, Discharge home, Breastfeeding, Lactation consult, Social Work consult, and Contraception NFP. Doing well, will require lovenox on discharge for prior PE on birth control. Likely DC 4/3.   LOS: 2 days   Myrene BuddyJacob Laini Urick 01/09/2018, 2:13 PM

## 2018-01-09 NOTE — Lactation Note (Signed)
This note was copied from a baby's chart. Lactation Consultation Note  Patient Name: Morgan Nielsen Reason for consult: Initial assessment   P2, Baby 27 hours old.  Mother states this baby is latching well. She had difficulty latching her first child and came in for OP for 2 months and ended up pumping for one year due to latch problems. Provided mother with larger flanges for manual pump.   Maternal Data Has patient been taught Hand Expression?: Yes Does the patient have breastfeeding experience prior to this delivery?: Yes(had difficulty with latching )  Feeding    LATCH Score                   Interventions Interventions: Hand pump  Lactation Tools Discussed/Used     Consult Status      Hardie PulleyBerkelhammer, Sheli Dorin Boschen Nielsen, 1:59 PM

## 2018-01-10 LAB — RH IG WORKUP (INCLUDES ABO/RH)
ABO/RH(D): B NEG
Fetal Screen: NEGATIVE
Gestational Age(Wks): 41.1
Unit division: 0

## 2018-01-10 MED ORDER — ENOXAPARIN SODIUM 40 MG/0.4ML ~~LOC~~ SOLN: 40.0000 mg | SUBCUTANEOUS | 5 refills | Status: DC

## 2018-01-10 NOTE — Discharge Summary (Signed)
Obstetric Discharge Summary Reason for Admission: induction of labor Prenatal Procedures: none Intrapartum Procedures: spontaneous vaginal delivery Postpartum Procedures: none Complications-Operative and Postpartum: none Hemoglobin  Date Value Ref Range Status  01/08/2018 11.8 (L) 12.0 - 15.0 g/dL Final  16/10/960401/18/2019 54.010.5 (L) 11.1 - 15.9 g/dL Final   Hemoglobin, fingerstick  Date Value Ref Range Status  02/22/2013 13.2 12.0 - 16.0 g/dL Final   HCT  Date Value Ref Range Status  01/08/2018 36.0 36.0 - 46.0 % Final   Hematocrit  Date Value Ref Range Status  10/27/2017 31.7 (L) 34.0 - 46.6 % Final    Physical Exam:  General: alert, cooperative and no distress Lochia: appropriate Uterine Fundus: firm Incision: healing well DVT Evaluation: No evidence of DVT seen on physical exam.  Discharge Diagnoses: Term Pregnancy-delivered  Discharge Information: Date: 01/10/2018 Activity: unrestricted Diet: routine Medications: Lovenox 40 mg injection daily for 6 months Condition: stable Instructions: refer to practice specific booklet Discharge to: home Follow-up Information    [facility]. Schedule an appointment as soon as possible for a visit.   Why:  Please call and make appointment in 2 weeks in 1 month. Contact information: [address] [place] [phone]          Newborn Data: Live born female  Birth Weight: 8 lb 6.2 oz (3805 g) APGAR: 9, 9  Newborn Delivery   Birth date/time:  01/08/2018 09:29:00 Delivery type:  Vaginal, Spontaneous     Morgan BeaversDavid J McMullen 01/10/2018, 7:52 AM

## 2018-01-10 NOTE — Discharge Instructions (Signed)
Vaginal Delivery, Care After °Refer to this sheet in the next few weeks. These instructions provide you with information about caring for yourself after vaginal delivery. Your health care provider may also give you more specific instructions. Your treatment has been planned according to current medical practices, but problems sometimes occur. Call your health care provider if you have any problems or questions. °What can I expect after the procedure? °After vaginal delivery, it is common to have: °· Some bleeding from your vagina. °· Soreness in your abdomen, your vagina, and the area of skin between your vaginal opening and your anus (perineum). °· Pelvic cramps. °· Fatigue. ° °Follow these instructions at home: °Medicines °· Take over-the-counter and prescription medicines only as told by your health care provider. °· If you were prescribed an antibiotic medicine, take it as told by your health care provider. Do not stop taking the antibiotic until it is finished. °Driving ° °· Do not drive or operate heavy machinery while taking prescription pain medicine. °· Do not drive for 24 hours if you received a sedative. °Lifestyle °· Do not drink alcohol. This is especially important if you are breastfeeding or taking medicine to relieve pain. °· Do not use tobacco products, including cigarettes, chewing tobacco, or e-cigarettes. If you need help quitting, ask your health care provider. °Eating and drinking °· Drink at least 8 eight-ounce glasses of water every day unless you are told not to by your health care provider. If you choose to breastfeed your baby, you may need to drink more water than this. °· Eat high-fiber foods every day. These foods may help prevent or relieve constipation. High-fiber foods include: °? Whole grain cereals and breads. °? Brown rice. °? Beans. °? Fresh fruits and vegetables. °Activity °· Return to your normal activities as told by your health care provider. Ask your health care provider  what activities are safe for you. °· Rest as much as possible. Try to rest or take a nap when your baby is sleeping. °· Do not lift anything that is heavier than your baby or 10 lb (4.5 kg) until your health care provider says that it is safe. °· Talk with your health care provider about when you can engage in sexual activity. This may depend on your: °? Risk of infection. °? Rate of healing. °? Comfort and desire to engage in sexual activity. °Vaginal Care °· If you have an episiotomy or a vaginal tear, check the area every day for signs of infection. Check for: °? More redness, swelling, or pain. °? More fluid or blood. °? Warmth. °? Pus or a bad smell. °· Do not use tampons or douches until your health care provider says this is safe. °· Watch for any blood clots that may pass from your vagina. These may look like clumps of dark red, brown, or black discharge. °General instructions °· Keep your perineum clean and dry as told by your health care provider. °· Wear loose, comfortable clothing. °· Wipe from front to back when you use the toilet. °· Ask your health care provider if you can shower or take a bath. If you had an episiotomy or a perineal tear during labor and delivery, your health care provider may tell you not to take baths for a certain length of time. °· Wear a bra that supports your breasts and fits you well. °· If possible, have someone help you with household activities and help care for your baby for at least a few days after   you leave the hospital. °· Keep all follow-up visits for you and your baby as told by your health care provider. This is important. °Contact a health care provider if: °· You have: °? Vaginal discharge that has a bad smell. °? Difficulty urinating. °? Pain when urinating. °? A sudden increase or decrease in the frequency of your bowel movements. °? More redness, swelling, or pain around your episiotomy or vaginal tear. °? More fluid or blood coming from your episiotomy or  vaginal tear. °? Pus or a bad smell coming from your episiotomy or vaginal tear. °? A fever. °? A rash. °? Little or no interest in activities you used to enjoy. °? Questions about caring for yourself or your baby. °· Your episiotomy or vaginal tear feels warm to the touch. °· Your episiotomy or vaginal tear is separating or does not appear to be healing. °· Your breasts are painful, hard, or turn red. °· You feel unusually sad or worried. °· You feel nauseous or you vomit. °· You pass large blood clots from your vagina. If you pass a blood clot from your vagina, save it to show to your health care provider. Do not flush blood clots down the toilet without having your health care provider look at them. °· You urinate more than usual. °· You are dizzy or light-headed. °· You have not breastfed at all and you have not had a menstrual period for 12 weeks after delivery. °· You have stopped breastfeeding and you have not had a menstrual period for 12 weeks after you stopped breastfeeding. °Get help right away if: °· You have: °? Pain that does not go away or does not get better with medicine. °? Chest pain. °? Difficulty breathing. °? Blurred vision or spots in your vision. °? Thoughts about hurting yourself or your baby. °· You develop pain in your abdomen or in one of your legs. °· You develop a severe headache. °· You faint. °· You bleed from your vagina so much that you fill two sanitary pads in one hour. °This information is not intended to replace advice given to you by your health care provider. Make sure you discuss any questions you have with your health care provider. °Document Released: 09/23/2000 Document Revised: 03/09/2016 Document Reviewed: 10/11/2015 °Elsevier Interactive Patient Education © 2018 [employer] ° °

## 2018-01-10 NOTE — Lactation Note (Signed)
This note was copied from a baby's chart. Lactation Consultation Note  Patient Name: Morgan Nielsen: 01/10/2018  Mom and baby ready for discharge.  Mom states baby is latching well.  Her nipples are sore due to cluster feeding and she requests comfort gels.  Gels given with instructions.  Mom denies questions.  Lactation outpatient services and support information reviewed and encouraged prn.   Maternal Data    Feeding    LATCH Score Latch: Grasps breast easily, tongue down, lips flanged, rhythmical sucking.  Audible Swallowing: Spontaneous and intermittent  Type of Nipple: Everted at rest and after stimulation  Comfort (Breast/Nipple): Filling, red/small blisters or bruises, mild/mod discomfort  Hold (Positioning): Assistance needed to correctly position infant at breast and maintain latch.  LATCH Score: 8  Interventions    Lactation Tools Discussed/Used     Consult Status      Huston FoleyMOULDEN, Roderick Sweezy S 01/10/2018, 12:04 PM

## 2018-01-23 ENCOUNTER — Ambulatory Visit: Payer: Medicaid Other | Admitting: Women's Health

## 2018-02-05 ENCOUNTER — Encounter: Payer: Self-pay | Admitting: Advanced Practice Midwife

## 2018-02-06 ENCOUNTER — Other Ambulatory Visit: Payer: Self-pay | Admitting: Women's Health

## 2018-02-14 ENCOUNTER — Encounter: Payer: Self-pay | Admitting: Advanced Practice Midwife

## 2018-02-14 ENCOUNTER — Encounter: Payer: Self-pay | Admitting: *Deleted

## 2018-02-14 ENCOUNTER — Ambulatory Visit (INDEPENDENT_AMBULATORY_CARE_PROVIDER_SITE_OTHER): Payer: Medicaid Other | Admitting: Advanced Practice Midwife

## 2018-02-14 NOTE — Progress Notes (Signed)
Morgan Nielsen is a 32 y.o. who presents for a postpartum visit. She is 5 weeks postpartum following a spontaneous vaginal delivery. I have fully reviewed the prenatal and intrapartum course. The delivery was at 41.1 gestational weeks (VBAC).  Anesthesia: none. Postpartum course has been uneventful  On lovenox for hx PE. Baby's course has been uneventful. Baby is feeding by breast. Bleeding: no bleeding. Bowel function is normal. Bladder function is normal. Patient is not sexually active. Contraception method is abstinence. Postpartum depression screening: negative.   Current Outpatient Medications:  .  enoxaparin (LOVENOX) 40 MG/0.4ML injection, Inject 0.4 mLs (40 mg total) into the skin daily., Disp: 30 Syringe, Rfl: 5  Review of Systems   Constitutional: Negative for fever and chills Eyes: Negative for visual disturbances Respiratory: Negative for shortness of breath, dyspnea Cardiovascular: Negative for chest pain or palpitations  Gastrointestinal: Negative for vomiting, diarrhea and constipation Genitourinary: Negative for dysuria and urgency Musculoskeletal: Negative for back pain, joint pain, myalgias  Neurological: Negative for dizziness and headaches    Objective:     Vitals:   02/14/18 1017  BP: 120/80  Pulse: 96   General:  alert, cooperative and no distress   Breasts:  negative  Lungs: Normal respiratory effort  Heart:  regular rate and rhythm  Abdomen: Soft, nontender   Vulva:  normal  Vagina: normal vagina  Cervix:  closed  Corpus: Well involuted     Rectal Exam: noi hemorrhoids        Assessment:    normal postpartum exam.  Plan:   1. Contraception: rhythm method 2. Follow up in:  or as needed.

## 2020-04-07 ENCOUNTER — Telehealth: Payer: Self-pay | Admitting: *Deleted

## 2020-04-07 ENCOUNTER — Ambulatory Visit (INDEPENDENT_AMBULATORY_CARE_PROVIDER_SITE_OTHER): Payer: Medicaid Other | Admitting: *Deleted

## 2020-04-07 ENCOUNTER — Encounter: Payer: Self-pay | Admitting: *Deleted

## 2020-04-07 VITALS — BP 123/77 | HR 67 | Ht 69.0 in | Wt 232.0 lb

## 2020-04-07 DIAGNOSIS — Z3201 Encounter for pregnancy test, result positive: Secondary | ICD-10-CM | POA: Diagnosis not present

## 2020-04-07 LAB — POCT URINE PREGNANCY: Preg Test, Ur: POSITIVE — AB

## 2020-04-07 MED ORDER — ENOXAPARIN SODIUM 40 MG/0.4ML ~~LOC~~ SOLN
40.0000 mg | SUBCUTANEOUS | 9 refills | Status: DC
Start: 2020-04-07 — End: 2020-12-03

## 2020-04-07 MED ORDER — DOXYLAMINE-PYRIDOXINE 10-10 MG PO TBEC: DELAYED_RELEASE_TABLET | ORAL | 6 refills | Status: DC

## 2020-04-07 NOTE — Progress Notes (Addendum)
   NURSE VISIT- PREGNANCY CONFIRMATION   SUBJECTIVE:  Morgan Nielsen is a 34 y.o. G34P2002 female at [redacted]w[redacted]d by uncertain LMP of Patient's last menstrual period was 02/20/2020 (exact date). Here for pregnancy confirmation.  Home pregnancy test: positive x two.  She reports no complaints.  She is taking prenatal vitamins.    OBJECTIVE:  BP 123/77 (BP Location: Left Arm, Patient Position: Sitting, Cuff Size: Normal)   Pulse 67   Ht 5\' 9"  (1.753 m)   Wt 232 lb (105.2 kg)   LMP 02/20/2020 (Exact Date)   BMI 34.26 kg/m   Appears well, in no apparent distress OB History  Gravida Para Term Preterm AB Living  3 2 2  0 0 2  SAB TAB Ectopic Multiple Live Births  0 0 0 0 2    # Outcome Date GA Lbr Len/2nd Weight Sex Delivery Anes PTL Lv  3 Current           2 Term 01/08/18 [redacted]w[redacted]d 06:59 / 00:24 8 lb 6.2 oz (3.805 kg) F Vag-Spont None  LIV     Birth Comments: wnl  1 Term 12/07/13 [redacted]w[redacted]d  6 lb 7.9 oz (2.945 kg) F CS-LTranv Spinal  LIV    Results for orders placed or performed in visit on 04/07/20 (from the past 24 hour(s))  POCT urine pregnancy   Collection Time: 04/07/20 10:48 AM  Result Value Ref Range   Preg Test, Ur Positive (A) Negative    ASSESSMENT: Positive pregnancy test, [redacted]w[redacted]d by LMP . Patient state was on Lovenox previously during pregnancy for blood clots.   PLAN: Schedule for dating ultrasound in one week. Prenatal vitamins: continue   Nausea medicines: requested-note routed to 04/09/20 CNM to send prescription   OB packet given: Yes  [redacted]w[redacted]d  04/07/2020 10:49 AM   Chart reviewed for nurse visit. Agree with plan of care. Rx diclegis for nausea. Discussed h/o PE w/ LHE, thrombophilia work up initially pos for protein s deficiency, normal on recheck, but total protein c was elevated. Per LHE, Lovenox 40mg  daily, so rx sent.  Stoney Bang, 04/09/2020 04/07/2020 3:04 PM

## 2020-04-07 NOTE — Addendum Note (Signed)
Addended by: Cheral Marker on: 04/07/2020 03:06 PM   Modules accepted: Orders

## 2020-04-07 NOTE — Telephone Encounter (Signed)
Telephoned patient at home number and advised prescriptions were called into pharmacy.

## 2020-04-20 ENCOUNTER — Other Ambulatory Visit: Payer: Self-pay | Admitting: Obstetrics and Gynecology

## 2020-04-20 DIAGNOSIS — O3680X0 Pregnancy with inconclusive fetal viability, not applicable or unspecified: Secondary | ICD-10-CM

## 2020-04-21 ENCOUNTER — Ambulatory Visit (INDEPENDENT_AMBULATORY_CARE_PROVIDER_SITE_OTHER): Payer: Medicaid Other

## 2020-04-21 ENCOUNTER — Other Ambulatory Visit: Payer: Self-pay

## 2020-04-21 DIAGNOSIS — Z3A08 8 weeks gestation of pregnancy: Secondary | ICD-10-CM | POA: Diagnosis not present

## 2020-04-21 DIAGNOSIS — O3680X0 Pregnancy with inconclusive fetal viability, not applicable or unspecified: Secondary | ICD-10-CM | POA: Diagnosis not present

## 2020-04-21 NOTE — Progress Notes (Signed)
[place] 8+5 wks,single IUP,FHR 178 BPM,CRL 21.95 mm,normal left ovary,simple right corpus luteal cyst 4.4 x 3.3 x 2.8 cm,

## 2020-05-19 ENCOUNTER — Ambulatory Visit: Payer: Medicaid Other | Admitting: *Deleted

## 2020-05-19 ENCOUNTER — Other Ambulatory Visit: Payer: Self-pay | Admitting: Obstetrics & Gynecology

## 2020-05-19 ENCOUNTER — Other Ambulatory Visit: Payer: Medicaid Other

## 2020-05-19 DIAGNOSIS — Z3682 Encounter for antenatal screening for nuchal translucency: Secondary | ICD-10-CM

## 2020-05-20 ENCOUNTER — Ambulatory Visit (INDEPENDENT_AMBULATORY_CARE_PROVIDER_SITE_OTHER): Payer: Medicaid Other

## 2020-05-20 ENCOUNTER — Ambulatory Visit: Payer: Medicaid Other | Admitting: *Deleted

## 2020-05-20 ENCOUNTER — Ambulatory Visit (INDEPENDENT_AMBULATORY_CARE_PROVIDER_SITE_OTHER): Payer: Medicaid Other | Admitting: Advanced Practice Midwife

## 2020-05-20 ENCOUNTER — Encounter: Payer: Self-pay | Admitting: Advanced Practice Midwife

## 2020-05-20 DIAGNOSIS — Z331 Pregnant state, incidental: Secondary | ICD-10-CM | POA: Diagnosis not present

## 2020-05-20 DIAGNOSIS — Z3682 Encounter for antenatal screening for nuchal translucency: Secondary | ICD-10-CM | POA: Diagnosis not present

## 2020-05-20 DIAGNOSIS — Z98891 History of uterine scar from previous surgery: Secondary | ICD-10-CM

## 2020-05-20 DIAGNOSIS — Z8759 Personal history of other complications of pregnancy, childbirth and the puerperium: Secondary | ICD-10-CM

## 2020-05-20 DIAGNOSIS — Z3480 Encounter for supervision of other normal pregnancy, unspecified trimester: Secondary | ICD-10-CM

## 2020-05-20 DIAGNOSIS — Z1389 Encounter for screening for other disorder: Secondary | ICD-10-CM

## 2020-05-20 DIAGNOSIS — Z3A12 12 weeks gestation of pregnancy: Secondary | ICD-10-CM | POA: Diagnosis not present

## 2020-05-20 DIAGNOSIS — Z6791 Unspecified blood type, Rh negative: Secondary | ICD-10-CM

## 2020-05-20 DIAGNOSIS — O99841 Bariatric surgery status complicating pregnancy, first trimester: Secondary | ICD-10-CM | POA: Diagnosis not present

## 2020-05-20 DIAGNOSIS — O26899 Other specified pregnancy related conditions, unspecified trimester: Secondary | ICD-10-CM

## 2020-05-20 DIAGNOSIS — Z3490 Encounter for supervision of normal pregnancy, unspecified, unspecified trimester: Secondary | ICD-10-CM | POA: Insufficient documentation

## 2020-05-20 DIAGNOSIS — E039 Hypothyroidism, unspecified: Secondary | ICD-10-CM

## 2020-05-20 LAB — POCT URINALYSIS DIPSTICK OB
Blood, UA: NEGATIVE
Glucose, UA: NEGATIVE
Ketones, UA: NEGATIVE
Leukocytes, UA: NEGATIVE
Nitrite, UA: NEGATIVE
POC,PROTEIN,UA: NEGATIVE

## 2020-05-20 MED ORDER — ASPIRIN 81 MG PO CHEW: 162.0000 mg | CHEWABLE_TABLET | Freq: Every day | ORAL | 6 refills | Status: DC

## 2020-05-20 NOTE — Progress Notes (Signed)
INITIAL OBSTETRICAL VISIT Patient name: Morgan Nielsen MRN [id number]  Date of birth: [birth year] Chief Complaint:   Initial Prenatal Visit (nt/it)  History of Present Illness:   Morgan Nielsen is a 34 y.o. G51P2002 African American female at [redacted]w[redacted]d by LMP c/w u/s at 8 weeks with an Estimated Date of Delivery: 11/26/20 being seen today for her initial obstetrical visit.   Her obstetrical history is significant for gHTN with 1st preg; Rh neg; C/S followed by successful VBAC; hx PE on prophylactic Lovenox.   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  Depression screen [facility] 2/9 05/20/2020 06/16/2017 05/12/2017  Decreased Interest 1 0 0  Down, Depressed, Hopeless 0 0 0  PHQ - 2 Score 1 0 0  Altered sleeping 2 0 0  Tired, decreased energy 2 2 2   Change in appetite 1 1 1   Feeling bad or failure about yourself  0 0 0  Trouble concentrating 0 0 0  Moving slowly or fidgety/restless 0 0 0  Suicidal thoughts 0 0 0  PHQ-9 Score 6 3 3     Patient's last menstrual period was 02/20/2020 (exact date). Last pap Sept 2018. Results were: normal Review of Systems:   Pertinent items are noted in HPI Denies cramping/contractions, leakage of fluid, vaginal bleeding, abnormal vaginal discharge w/ itching/odor/irritation, headaches, visual changes, shortness of breath, chest pain, abdominal pain, severe nausea/vomiting, or problems with urination or bowel movements unless otherwise stated above.  Pertinent History Reviewed:  Reviewed past medical,surgical, social, obstetrical and family history.  Reviewed problem list, medications and allergies. OB History  Gravida Para Term Preterm AB Living  3 2 2  0 0 2  SAB TAB Ectopic Multiple Live Births  0 0 0 0 2    # Outcome Date GA Lbr Len/2nd Weight Sex Delivery Anes PTL Lv  3 Current           2 Term 01/08/18 [redacted]w[redacted]d 06:59 / 00:24 8 lb 6.2 oz (3.805 kg) F  Vag-Spont None N LIV     Birth Comments: wnl  1 Term 12/07/13 [redacted]w[redacted]d  6 lb 7.9 oz (2.945 kg) F CS-LTranv Spinal N LIV     Complications: Breech presentation, Gestational hypertension   Physical Assessment:  There were no vitals filed for this visit.There is no height or weight on file to calculate BMI.       Physical Examination:  General appearance - well appearing, and in no distress  Mental status - alert, oriented to person, place, and time  Psych:  She has a normal mood and affect  Skin - warm and dry, normal color, no suspicious lesions noted  Chest - effort normal, all lung fields clear to auscultation bilaterally  Heart - normal rate and regular rhythm  Abdomen - soft, nontender  Extremities:  No swelling or varicosities noted  Pelvic - deferred  Thin prep pap is not done   TODAY'S NT 03/10/18 12+6 wks,measurements c/w dates,crl 65.92 mm,fhr 159 bpm,NB present,NT 1.5 mm,posterior placenta gr 0,simple right corpus luteal cyst 2.7 x 1.7 x 1.7 cm,normal left ovary  Results for orders placed or performed in visit on 05/20/20 (from the past 24 hour(s))  POC Urinalysis Dipstick OB   Collection Time: 05/20/20 10:42 AM  Result Value Ref Range   Color, UA     Clarity, UA     Glucose, UA Negative Negative   Bilirubin, UA  Ketones, UA neg    Spec Grav, UA     Blood, UA neg    pH, UA     POC,PROTEIN,UA Negative Negative, Trace, Small (1+), Moderate (2+), Large (3+), 4+   Urobilinogen, UA     Nitrite, UA neg    Leukocytes, UA Negative Negative   Appearance     Odor      Assessment & Plan:  1) Low-Risk Pregnancy G3P2002 at [redacted]w[redacted]d with an Estimated Date of Delivery: 11/26/20   2) Initial OB visit  3) Jehovah's Witness, okay with Rhogam  4) Hx gHTN with first preg, rx bASA 162mg   5) Prev C/S for breech followed by successful VBAC, plans TOLAC  6) Hx PE provoked (Nuvaring), on prophylactic Lovenox  7) Rh neg, plan for Rhogam at 28wks  8) Hypothyroid on Synthroid ,  thyroid panel today  Meds:  Meds ordered this encounter  Medications  . aspirin 81 MG chewable tablet    Sig: Chew 2 tablets (162 mg total) by mouth daily.    Dispense:  60 tablet    Refill:  6    Order Specific Question:   Supervising Provider    Answer:   [phone]    Initial labs obtained Continue prenatal vitamins Reviewed n/v relief measures and warning s/s to report Reviewed recommended weight gain based on pre-gravid BMI Encouraged well-balanced diet Genetic & carrier screening discussed: requests NT/IT, declines Panorama and Horizon 14  Ultrasound discussed; fetal survey: requested CCNC completed> form faxed if has or is planning to apply for medicaid The nature of [facility] [[id number]] with multiple MDs and other Advanced Practice Providers was explained to patient; also emphasized that fellows, residents, and students are part of our team. Has home bp cuff. Check bp weekly, let [employer] know if >140/90.   Indications for ASA therapy (per uptodate) One of the following: H/O preeclampsia, especially early onset/adverse outcome Yes (gHTN)  Indications for early A1C (per uptodate) BMI >=25 (>=23 in Asian women) AND one of the following High-risk race/ethnicity (eg, African American, Latino, Native American, [place] American, [place]er) Yes   Follow-up: Return in about 5 weeks (around 06/25/2020) for LROB, 06/27/2020: Anatomy, 2nd IT, in person.   Orders Placed This Encounter  Procedures  . GC/Chlamydia Probe Amp  . Urine Culture  . TSH  . Integrated 1  . CBC/D/Plt+RPR+Rh+ABO+Rub Ab...  . Pain Management Screening Profile (10S)  . Protein / creatinine ratio, urine  . Comprehensive metabolic panel  . T4  . T3 Uptake  . HgB A1c  . POC Urinalysis Dipstick OB    [place] [facility] 05/20/2020 12:28 PM

## 2020-05-20 NOTE — Progress Notes (Signed)
[place] 12+6 wks,measurements c/w dates,crl 65.92 mm,fhr 159 bpm,NB present,NT 1.5 mm,posterior placenta gr 0,simple right corpus luteal cyst 2.7 x 1.7 x 1.7 cm,normal left ovary

## 2020-05-20 NOTE — Patient Instructions (Signed)
Morgan Nielsen, I greatly value your feedback.  If you receive a survey following your visit with [place] today, we appreciate you taking the time to fill it out.  Thanks, Philipp Deputy CNM   [facility] ([address] [place], [place] [address]) Entrance C, located off of [address] 24/7 valet parking   Nausea & Vomiting  Have saltine crackers or pretzels by your bed and eat a few bites before you raise your head out of bed in the morning  Eat small frequent meals throughout the day instead of large meals  Drink plenty of fluids throughout the day to stay hydrated, just don't drink a lot of fluids with your meals.  This can make your stomach fill up faster making you feel sick  Do not brush your teeth right after you eat  Products with real ginger are good for nausea, like ginger ale and ginger hard candy Make sure it says made with real ginger!  Sucking on sour candy like lemon heads is also good for nausea  If your prenatal vitamins make you nauseated, take them at night so you will sleep through the nausea  Sea Bands  If you feel like you need medicine for the nausea & vomiting please let us know  If you are unable to keep any fluids or food down please let us know   Constipation  Drink plenty of fluid, preferably water, throughout the day  Eat foods high in fiber such as fruits, vegetables, and grains  Exercise, such as walking, is a good way to keep your bowels regular  Drink warm fluids, especially warm prune juice, or decaf coffee  Eat a 1/2 cup of real oatmeal (not instant), 1/2 cup applesauce, and 1/2-1 cup warm prune juice every day  If needed, you may take Colace (docusate sodium) stool softener once or twice a day to help keep the stool soft.   If you still are having problems with constipation, you may take Miralax once daily as needed to help keep your bowels regular.   Home Blood Pressure Monitoring for Patients   Your  provider has recommended that you check your blood pressure (BP) at least once a week at home. If you do not have a blood pressure cuff at home, one will be provided for you. Contact your provider if you have not received your monitor within 1 week.   Helpful Tips for Accurate Home Blood Pressure Checks  . Don't smoke, exercise, or drink caffeine 30 minutes before checking your BP . Use the restroom before checking your BP (a full bladder can raise your pressure) . Relax in a comfortable upright chair . Feet on the ground . Left arm resting comfortably on a flat surface at the level of your heart . Legs uncrossed . Back supported . Sit quietly and don't talk . Place the cuff on your bare arm . Adjust snuggly, so that only two fingertips can fit between your skin and the top of the cuff . Check 2 readings separated by at least one minute . Keep a log of your BP readings . For a visual, please reference this diagram: http://ccnc.care/bpdiagram  Provider Name: [facility]     Phone: [phone]  Zone 1: ALL CLEAR  Continue to monitor your symptoms:  . BP reading is less than 140 (top number) or less than 90 (bottom number)  . No right upper stomach pain . No headaches or seeing  spots . No feeling nauseated or throwing up . No swelling in face and hands  Zone 2: CAUTION Call your doctor's office for any of the following:  . BP reading is greater than 140 (top number) or greater than 90 (bottom number)  . Stomach pain under your ribs in the middle or right side . Headaches or seeing spots . Feeling nauseated or throwing up . Swelling in face and hands  Zone 3: EMERGENCY  Seek immediate medical care if you have any of the following:  . BP reading is greater than160 (top number) or greater than 110 (bottom number) . Severe headaches not improving with Tylenol . Serious difficulty catching your breath . Any worsening symptoms from Zone 2    First Trimester of Pregnancy The  first trimester of pregnancy is from week 1 until the end of week 12 (months 1 through 3). A week after a sperm fertilizes an egg, the egg will implant on the wall of the uterus. This embryo will begin to develop into a baby. Genes from you and your partner are forming the baby. The female genes determine whether the baby is a boy or a girl. At 6-8 weeks, the eyes and face are formed, and the heartbeat can be seen on ultrasound. At the end of 12 weeks, all the baby's organs are formed.  Now that you are pregnant, you will want to do everything you can to have a healthy baby. Two of the most important things are to get good prenatal care and to follow your health care provider's instructions. Prenatal care is all the medical care you receive before the baby's birth. This care will help prevent, find, and treat any problems during the pregnancy and childbirth. BODY CHANGES Your body goes through many changes during pregnancy. The changes vary from woman to woman.   You may gain or lose a couple of pounds at first.  You may feel sick to your stomach (nauseous) and throw up (vomit). If the vomiting is uncontrollable, call your health care provider.  You may tire easily.  You may develop headaches that can be relieved by medicines approved by your health care provider.  You may urinate more often. Painful urination may mean you have a bladder infection.  You may develop heartburn as a result of your pregnancy.  You may develop constipation because certain hormones are causing the muscles that push waste through your intestines to slow down.  You may develop hemorrhoids or swollen, bulging veins (varicose veins).  Your breasts may begin to grow larger and become tender. Your nipples may stick out more, and the tissue that surrounds them (areola) may become darker.  Your gums may bleed and may be sensitive to brushing and flossing.  Dark spots or blotches (chloasma, mask of pregnancy) may develop on  your face. This will likely fade after the baby is born.  Your menstrual periods will stop.  You may have a loss of appetite.  You may develop cravings for certain kinds of food.  You may have changes in your emotions from day to day, such as being excited to be pregnant or being concerned that something may go wrong with the pregnancy and baby.  You may have more vivid and strange dreams.  You may have changes in your hair. These can include thickening of your hair, rapid growth, and changes in texture. Some women also have hair loss during or after pregnancy, or hair that feels dry or thin. Your hair  will most likely return to normal after your baby is born. WHAT TO EXPECT AT YOUR PRENATAL VISITS During a routine prenatal visit:  You will be weighed to make sure you and the baby are growing normally.  Your blood pressure will be taken.  Your abdomen will be measured to track your baby's growth.  The fetal heartbeat will be listened to starting around week 10 or 12 of your pregnancy.  Test results from any previous visits will be discussed. Your health care provider may ask you:  How you are feeling.  If you are feeling the baby move.  If you have had any abnormal symptoms, such as leaking fluid, bleeding, severe headaches, or abdominal cramping.  If you have any questions. Other tests that may be performed during your first trimester include:  Blood tests to find your blood type and to check for the presence of any previous infections. They will also be used to check for low iron levels (anemia) and Rh antibodies. Later in the pregnancy, blood tests for diabetes will be done along with other tests if problems develop.  Urine tests to check for infections, diabetes, or protein in the urine.  An ultrasound to confirm the proper growth and development of the baby.  An amniocentesis to check for possible genetic problems.  Fetal screens for spina bifida and Down  syndrome.  You may need other tests to make sure you and the baby are doing well. HOME CARE INSTRUCTIONS  Medicines  Follow your health care provider's instructions regarding medicine use. Specific medicines may be either safe or unsafe to take during pregnancy.  Take your prenatal vitamins as directed.  If you develop constipation, try taking a stool softener if your health care provider approves. Diet  Eat regular, well-balanced meals. Choose a variety of foods, such as meat or vegetable-based protein, fish, milk and low-fat dairy products, vegetables, fruits, and whole grain breads and cereals. Your health care provider will help you determine the amount of weight gain that is right for you.  Avoid raw meat and uncooked cheese. These carry germs that can cause birth defects in the baby.  Eating four or five small meals rather than three large meals a day may help relieve nausea and vomiting. If you start to feel nauseous, eating a few soda crackers can be helpful. Drinking liquids between meals instead of during meals also seems to help nausea and vomiting.  If you develop constipation, eat more high-fiber foods, such as fresh vegetables or fruit and whole grains. Drink enough fluids to keep your urine clear or pale yellow. Activity and Exercise  Exercise only as directed by your health care provider. Exercising will help you:  Control your weight.  Stay in shape.  Be prepared for labor and delivery.  Experiencing pain or cramping in the lower abdomen or low back is a good sign that you should stop exercising. Check with your health care provider before continuing normal exercises.  Try to avoid standing for long periods of time. Move your legs often if you must stand in one place for a long time.  Avoid heavy lifting.  Wear low-heeled shoes, and practice good posture.  You may continue to have sex unless your health care provider directs you otherwise. Relief of Pain or  Discomfort  Wear a good support bra for breast tenderness.    Take warm sitz baths to soothe any pain or discomfort caused by hemorrhoids. Use hemorrhoid cream if your health care provider approves.  Rest with your legs elevated if you have leg cramps or low back pain.  If you develop varicose veins in your legs, wear support hose. Elevate your feet for 15 minutes, 3-4 times a day. Limit salt in your diet. Prenatal Care  Schedule your prenatal visits by the twelfth week of pregnancy. They are usually scheduled monthly at first, then more often in the last 2 months before delivery.  Write down your questions. Take them to your prenatal visits.  Keep all your prenatal visits as directed by your health care provider. Safety  Wear your seat belt at all times when driving.  Make a list of emergency phone numbers, including numbers for family, friends, the hospital, and police and fire departments. General Tips  Ask your health care provider for a referral to a local prenatal education class. Begin classes no later than at the beginning of month 6 of your pregnancy.  Ask for help if you have counseling or nutritional needs during pregnancy. Your health care provider can offer advice or refer you to specialists for help with various needs.  Do not use hot tubs, steam rooms, or saunas.  Do not douche or use tampons or scented sanitary pads.  Do not cross your legs for long periods of time.  Avoid cat litter boxes and soil used by cats. These carry germs that can cause birth defects in the baby and possibly loss of the fetus by miscarriage or stillbirth.  Avoid all smoking, herbs, alcohol, and medicines not prescribed by your health care provider. Chemicals in these affect the formation and growth of the baby.  Schedule a dentist appointment. At home, brush your teeth with a soft toothbrush and be gentle when you floss. SEEK MEDICAL CARE IF:   You have dizziness.  You have mild  pelvic cramps, pelvic pressure, or nagging pain in the abdominal area.  You have persistent nausea, vomiting, or diarrhea.  You have a bad smelling vaginal discharge.  You have pain with urination.  You notice increased swelling in your face, hands, legs, or ankles. SEEK IMMEDIATE MEDICAL CARE IF:   You have a fever.  You are leaking fluid from your vagina.  You have spotting or bleeding from your vagina.  You have severe abdominal cramping or pain.  You have rapid weight gain or loss.  You vomit blood or material that looks like coffee grounds.  You are exposed to [place] measles and have never had them.  You are exposed to fifth disease or chickenpox.  You develop a severe headache.  You have shortness of breath.  You have any kind of trauma, such as from a fall or a car accident. Document Released: 09/20/2001 Document Revised: 02/10/2014 Document Reviewed: 08/06/2013 [facility] Patient Information 2015 [place], [place]. This information is not intended to replace advice given to you by your health care provider. Make sure you discuss any questions you have with your health care provider.

## 2020-05-21 LAB — HEMOGLOBIN A1C
Est. average glucose Bld gHb Est-mCnc: 114 mg/dL
Hgb A1c MFr Bld: 5.6 % (ref 4.8–5.6)

## 2020-05-21 LAB — PMP SCREEN PROFILE (10S), URINE
Amphetamine Scrn, Ur: NEGATIVE ng/mL
BARBITURATE SCREEN URINE: NEGATIVE ng/mL
BENZODIAZEPINE SCREEN, URINE: NEGATIVE ng/mL
CANNABINOIDS UR QL SCN: NEGATIVE ng/mL
Cocaine (Metab) Scrn, Ur: NEGATIVE ng/mL
Creatinine(Crt), U: 239.7 mg/dL (ref 20.0–300.0)
Methadone Screen, Urine: NEGATIVE ng/mL
OXYCODONE+OXYMORPHONE UR QL SCN: NEGATIVE ng/mL
Opiate Scrn, Ur: NEGATIVE ng/mL
Ph of Urine: 6.5 (ref 4.5–8.9)
Phencyclidine Qn, Ur: NEGATIVE ng/mL
Propoxyphene Scrn, Ur: NEGATIVE ng/mL

## 2020-05-21 LAB — T3 UPTAKE
Free Thyroxine Index: 2.2 (ref 1.2–4.9)
T3 Uptake Ratio: 22 % — ABNORMAL LOW (ref 24–39)

## 2020-05-21 LAB — T4: T4, Total: 10.2 ug/dL (ref 4.5–12.0)

## 2020-05-21 LAB — INTEGRATED 1

## 2020-05-22 ENCOUNTER — Encounter: Payer: Self-pay | Admitting: Advanced Practice Midwife

## 2020-05-22 ENCOUNTER — Other Ambulatory Visit: Payer: Self-pay | Admitting: Advanced Practice Midwife

## 2020-05-22 DIAGNOSIS — O99019 Anemia complicating pregnancy, unspecified trimester: Secondary | ICD-10-CM | POA: Insufficient documentation

## 2020-05-22 LAB — CBC/D/PLT+RPR+RH+ABO+RUB AB...
Antibody Screen: NEGATIVE
Basophils Absolute: 0 10*3/uL (ref 0.0–0.2)
Basos: 0 %
EOS (ABSOLUTE): 0.1 10*3/uL (ref 0.0–0.4)
Eos: 1 %
HCV Ab: <0.1 s/co ratio (ref 0.0–0.9)
HIV Screen 4th Generation wRfx: NONREACTIVE
Hematocrit: 34.4 % (ref 34.0–46.6)
Hemoglobin: 10.5 g/dL — ABNORMAL LOW (ref 11.1–15.9)
Hepatitis B Surface Ag: NEGATIVE
Immature Grans (Abs): 0 10*3/uL (ref 0.0–0.1)
Immature Granulocytes: 0 %
Lymphocytes Absolute: 2.4 10*3/uL (ref 0.7–3.1)
Lymphs: 26 %
MCH: 24.2 pg — ABNORMAL LOW (ref 26.6–33.0)
MCHC: 30.5 g/dL — ABNORMAL LOW (ref 31.5–35.7)
MCV: 79 fL (ref 79–97)
Monocytes Absolute: 0.6 10*3/uL (ref 0.1–0.9)
Monocytes: 7 %
Neutrophils Absolute: 5.8 10*3/uL (ref 1.4–7.0)
Neutrophils: 66 %
Platelets: 211 10*3/uL (ref 150–450)
RBC: 4.33 x10E6/uL (ref 3.77–5.28)
RDW: 18.3 % — ABNORMAL HIGH (ref 11.7–15.4)
RPR Ser Ql: NONREACTIVE
Rh Factor: NEGATIVE
Rubella Antibodies, IGG: 5.04 index (ref >0.99)
WBC: 8.9 10*3/uL (ref 3.4–10.8)

## 2020-05-22 LAB — INTEGRATED 1
Crown Rump Length: 65.9 mm
Gest. Age on Collection Date: 12.7 weeks
Maternal Age at EDD: 34.1 yr
Nuchal Translucency (NT): 1.5 mm
Number of Fetuses: 1
PAPP-A Value: 1050.1 ng/mL
Weight: 234 [lb_av]

## 2020-05-22 LAB — COMPREHENSIVE METABOLIC PANEL
ALT: 15 IU/L (ref 0–32)
AST: 15 IU/L (ref 0–40)
Albumin/Globulin Ratio: 1.3 (ref 1.2–2.2)
Albumin: 4.1 g/dL (ref 3.8–4.8)
Alkaline Phosphatase: 79 IU/L (ref 48–121)
BUN/Creatinine Ratio: 17 (ref 9–23)
BUN: 12 mg/dL (ref 6–20)
Bilirubin Total: <0.2 mg/dL (ref 0.0–1.2)
CO2: 21 mmol/L (ref 20–29)
Calcium: 9.7 mg/dL (ref 8.7–10.2)
Chloride: 102 mmol/L (ref 96–106)
Creatinine, Ser: 0.7 mg/dL (ref 0.57–1.00)
GFR calc Af Amer: 132 mL/min/{1.73_m2} (ref >59)
GFR calc non Af Amer: 114 mL/min/{1.73_m2} (ref >59)
Globulin, Total: 3.1 g/dL (ref 1.5–4.5)
Glucose: 74 mg/dL (ref 65–99)
Potassium: 3.9 mmol/L (ref 3.5–5.2)
Sodium: 136 mmol/L (ref 134–144)
Total Protein: 7.2 g/dL (ref 6.0–8.5)

## 2020-05-22 LAB — HCV INTERPRETATION

## 2020-05-22 LAB — URINE CULTURE

## 2020-05-22 LAB — PROTEIN / CREATININE RATIO, URINE
Creatinine, Urine: 228.3 mg/dL
Protein, Ur: 13.9 mg/dL
Protein/Creat Ratio: 61 mg/g creat (ref 0–200)

## 2020-05-22 LAB — GC/CHLAMYDIA PROBE AMP
Chlamydia trachomatis, NAA: NEGATIVE
Neisseria Gonorrhoeae by PCR: NEGATIVE

## 2020-05-22 LAB — TSH: TSH: 1.58 u[IU]/mL (ref 0.450–4.500)

## 2020-05-22 MED ORDER — FERROUS FUMARATE-FOLIC ACID 324-1 MG PO TABS: 324.0000 mg | ORAL_TABLET | Freq: Every day | ORAL | 8 refills | Status: DC

## 2020-06-19 ENCOUNTER — Other Ambulatory Visit: Payer: Self-pay | Admitting: Advanced Practice Midwife

## 2020-06-19 DIAGNOSIS — Z363 Encounter for antenatal screening for malformations: Secondary | ICD-10-CM

## 2020-06-23 ENCOUNTER — Other Ambulatory Visit: Payer: Self-pay

## 2020-06-23 ENCOUNTER — Ambulatory Visit (INDEPENDENT_AMBULATORY_CARE_PROVIDER_SITE_OTHER): Payer: Medicaid Other

## 2020-06-23 ENCOUNTER — Other Ambulatory Visit (HOSPITAL_COMMUNITY)
Admission: RE | Admit: 2020-06-23 | Discharge: 2020-06-23 | Disposition: A | Discharge status: Home / Self Care | Payer: Medicaid Other | Source: Ambulatory Visit | Attending: Obstetrics & Gynecology | Admitting: Obstetrics & Gynecology

## 2020-06-23 ENCOUNTER — Ambulatory Visit (INDEPENDENT_AMBULATORY_CARE_PROVIDER_SITE_OTHER): Payer: Medicaid Other | Admitting: Women's Health

## 2020-06-23 ENCOUNTER — Encounter: Payer: Self-pay | Admitting: Women's Health

## 2020-06-23 VITALS — BP 105/62 | HR 64 | Wt 236.2 lb

## 2020-06-23 DIAGNOSIS — Z331 Pregnant state, incidental: Secondary | ICD-10-CM

## 2020-06-23 DIAGNOSIS — Z1389 Encounter for screening for other disorder: Secondary | ICD-10-CM

## 2020-06-23 DIAGNOSIS — Z3482 Encounter for supervision of other normal pregnancy, second trimester: Secondary | ICD-10-CM

## 2020-06-23 DIAGNOSIS — Z363 Encounter for antenatal screening for malformations: Secondary | ICD-10-CM | POA: Diagnosis not present

## 2020-06-23 DIAGNOSIS — Z3480 Encounter for supervision of other normal pregnancy, unspecified trimester: Secondary | ICD-10-CM | POA: Diagnosis present

## 2020-06-23 DIAGNOSIS — Z1379 Encounter for other screening for genetic and chromosomal anomalies: Secondary | ICD-10-CM

## 2020-06-23 DIAGNOSIS — Z3A17 17 weeks gestation of pregnancy: Secondary | ICD-10-CM

## 2020-06-23 DIAGNOSIS — O99012 Anemia complicating pregnancy, second trimester: Secondary | ICD-10-CM

## 2020-06-23 DIAGNOSIS — Z362 Encounter for other antenatal screening follow-up: Secondary | ICD-10-CM

## 2020-06-23 LAB — POCT URINALYSIS DIPSTICK OB
Blood, UA: NEGATIVE
Glucose, UA: NEGATIVE
Ketones, UA: NEGATIVE
Nitrite, UA: NEGATIVE
POC,PROTEIN,UA: NEGATIVE

## 2020-06-23 NOTE — Patient Instructions (Addendum)
Morgan Nielsen, I greatly value your feedback.  If you receive a survey following your visit with [place] today, we appreciate you taking the time to fill it out.  Thanks, Morgan Nielsen, CNM, WHNP-BC  [facility] ([address] [place], [place] [address]) Entrance C, located off of [address] valet parking  Go to [email or web address] to register for FREE online childbirth classes  [place] Pediatricians/Family Doctors:  [facility] [phone]            [facility] [phone]                 [facility] [phone] (usually not accepting new patients unless you have family there already, you are always welcome to call and ask)       [facility] Department [phone]       [facility] Pediatricians/Family Doctors:   [facility]: [phone]  [facility]: [phone]  [facility]: [phone]  [facility] Doctors:   [facility]: [phone]   [facility]: [phone]  [facility] Doctors:  [facility]: [phone]    Home Blood Pressure Monitoring for Patients   Your provider has recommended that you check your blood pressure (BP) at least once a week at home. If you do not have a blood pressure cuff at home, one will be provided for you. Contact your provider if you have not received your monitor within 1 week.   Helpful Tips for Accurate Home Blood Pressure Checks  . Don't smoke, exercise, or drink caffeine 30 minutes before checking your BP . Use the restroom before checking your BP (a full bladder can raise your pressure) . Relax in a comfortable upright chair . Feet on the ground . Left arm resting comfortably on a flat surface at the level of your heart . Legs uncrossed . Back supported . Sit quietly and don't talk . Place the cuff on your bare arm . Adjust snuggly, so  that only two fingertips can fit between your skin and the top of the cuff . Check 2 readings separated by at least one minute . Keep a log of your BP readings . For a visual, please reference this diagram: [email or web address].care/bpdiagram  Provider Name: [facility]     Phone: [phone]  Zone 1: ALL CLEAR  Continue to monitor your symptoms:  . BP reading is less than 140 (top number) or less than 90 (bottom number)  . No right upper stomach pain . No headaches or seeing spots . No feeling nauseated or throwing up . No swelling in face and hands  Zone 2: CAUTION Call your doctor's office for any of the following:  . BP reading is greater than 140 (top number) or greater than 90 (bottom number)  . Stomach pain under your ribs in the middle or right side . Headaches or seeing spots . Feeling nauseated or throwing up . Swelling in face and hands  Zone 3: EMERGENCY  Seek immediate medical care if you have any of the following:  . BP reading is greater than160 (top number) or greater than 110 (bottom number) . Severe headaches not improving with Tylenol . Serious difficulty catching your breath . Any worsening symptoms from Zone 2     Second Trimester of Pregnancy The second trimester is from week 14 through week 27 (months 4 through 6). The second trimester is often a time when you feel your  best. Your body has adjusted to being pregnant, and you begin to feel better physically. Usually, morning sickness has lessened or quit completely, you may have more energy, and you may have an increase in appetite. The second trimester is also a time when the fetus is growing rapidly. At the end of the sixth month, the fetus is about 9 inches long and weighs about 1 pounds. You will likely begin to feel the baby move (quickening) between 16 and 20 weeks of pregnancy. Body changes during your second trimester Your body continues to go through many changes during your second trimester. The  changes vary from woman to woman.  Your weight will continue to increase. You will notice your lower abdomen bulging out.  You may begin to get stretch marks on your hips, abdomen, and breasts.  You may develop headaches that can be relieved by medicines. The medicines should be approved by your health care provider.  You may urinate more often because the fetus is pressing on your bladder.  You may develop or continue to have heartburn as a result of your pregnancy.  You may develop constipation because certain hormones are causing the muscles that push waste through your intestines to slow down.  You may develop hemorrhoids or swollen, bulging veins (varicose veins).  You may have back pain. This is caused by: ? Weight gain. ? Pregnancy hormones that are relaxing the joints in your pelvis. ? A shift in weight and the muscles that support your balance.  Your breasts will continue to grow and they will continue to become tender.  Your gums may bleed and may be sensitive to brushing and flossing.  Dark spots or blotches (chloasma, mask of pregnancy) may develop on your face. This will likely fade after the baby is born.  A dark line from your belly button to the pubic area (linea nigra) may appear. This will likely fade after the baby is born.  You may have changes in your hair. These can include thickening of your hair, rapid growth, and changes in texture. Some women also have hair loss during or after pregnancy, or hair that feels dry or thin. Your hair will most likely return to normal after your baby is born.  What to expect at prenatal visits During a routine prenatal visit:  You will be weighed to make sure you and the fetus are growing normally.  Your blood pressure will be taken.  Your abdomen will be measured to track your baby's growth.  The fetal heartbeat will be listened to.  Any test results from the previous visit will be discussed.  Your health care  provider may ask you:  How you are feeling.  If you are feeling the baby move.  If you have had any abnormal symptoms, such as leaking fluid, bleeding, severe headaches, or abdominal cramping.  If you are using any tobacco products, including cigarettes, chewing tobacco, and electronic cigarettes.  If you have any questions.  Other tests that may be performed during your second trimester include:  Blood tests that check for: ? Low iron levels (anemia). ? High blood sugar that affects pregnant women (gestational diabetes) between 71 and 28 weeks. ? Rh antibodies. This is to check for a protein on red blood cells (Rh factor).  Urine tests to check for infections, diabetes, or protein in the urine.  An ultrasound to confirm the proper growth and development of the baby.  An amniocentesis to check for possible genetic problems.  Fetal  screens for spina bifida and Down syndrome.  HIV (human immunodeficiency virus) testing. Routine prenatal testing includes screening for HIV, unless you choose not to have this test.  Follow these instructions at home: Medicines  Follow your health care provider's instructions regarding medicine use. Specific medicines may be either safe or unsafe to take during pregnancy.  Take a prenatal vitamin that contains at least 600 micrograms (mcg) of folic acid.  If you develop constipation, try taking a stool softener if your health care provider approves. Eating and drinking  Eat a balanced diet that includes fresh fruits and vegetables, whole grains, good sources of protein such as meat, eggs, or tofu, and low-fat dairy. Your health care provider will help you determine the amount of weight gain that is right for you.  Avoid raw meat and uncooked cheese. These carry germs that can cause birth defects in the baby.  If you have low calcium intake from food, talk to your health care provider about whether you should take a daily calcium  supplement.  Limit foods that are high in fat and processed sugars, such as fried and sweet foods.  To prevent constipation: ? Drink enough fluid to keep your urine clear or pale yellow. ? Eat foods that are high in fiber, such as fresh fruits and vegetables, whole grains, and beans. Activity  Exercise only as directed by your health care provider. Most women can continue their usual exercise routine during pregnancy. Try to exercise for 30 minutes at least 5 days a week. Stop exercising if you experience uterine contractions.  Avoid heavy lifting, wear low heel shoes, and practice good posture.  A sexual relationship may be continued unless your health care provider directs you otherwise. Relieving pain and discomfort  Wear a good support bra to prevent discomfort from breast tenderness.  Take warm sitz baths to soothe any pain or discomfort caused by hemorrhoids. Use hemorrhoid cream if your health care provider approves.  Rest with your legs elevated if you have leg cramps or low back pain.  If you develop varicose veins, wear support hose. Elevate your feet for 15 minutes, 3-4 times a day. Limit salt in your diet. Prenatal Care  Write down your questions. Take them to your prenatal visits.  Keep all your prenatal visits as told by your health care provider. This is important. Safety  Wear your seat belt at all times when driving.  Make a list of emergency phone numbers, including numbers for family, friends, the hospital, and police and fire departments. General instructions  Ask your health care provider for a referral to a local prenatal education class. Begin classes no later than the beginning of month 6 of your pregnancy.  Ask for help if you have counseling or nutritional needs during pregnancy. Your health care provider can offer advice or refer you to specialists for help with various needs.  Do not use hot tubs, steam rooms, or saunas.  Do not douche or use  tampons or scented sanitary pads.  Do not cross your legs for long periods of time.  Avoid cat litter boxes and soil used by cats. These carry germs that can cause birth defects in the baby and possibly loss of the fetus by miscarriage or stillbirth.  Avoid all smoking, herbs, alcohol, and unprescribed drugs. Chemicals in these products can affect the formation and growth of the baby.  Do not use any products that contain nicotine or tobacco, such as cigarettes and e-cigarettes. If you need help  quitting, ask your health care provider.  Visit your dentist if you have not gone yet during your pregnancy. Use a soft toothbrush to brush your teeth and be gentle when you floss. Contact a health care provider if:  You have dizziness.  You have mild pelvic cramps, pelvic pressure, or nagging pain in the abdominal area.  You have persistent nausea, vomiting, or diarrhea.  You have a bad smelling vaginal discharge.  You have pain when you urinate. Get help right away if:  You have a fever.  You are leaking fluid from your vagina.  You have spotting or bleeding from your vagina.  You have severe abdominal cramping or pain.  You have rapid weight gain or weight loss.  You have shortness of breath with chest pain.  You notice sudden or extreme swelling of your face, hands, ankles, feet, or legs.  You have not felt your baby move in over an hour.  You have severe headaches that do not go away when you take medicine.  You have vision changes. Summary  The second trimester is from week 14 through week 27 (months 4 through 6). It is also a time when the fetus is growing rapidly.  Your body goes through many changes during pregnancy. The changes vary from woman to woman.  Avoid all smoking, herbs, alcohol, and unprescribed drugs. These chemicals affect the formation and growth your baby.  Do not use any tobacco products, such as cigarettes, chewing tobacco, and e-cigarettes. If you  need help quitting, ask your health care provider.  Contact your health care provider if you have any questions. Keep all prenatal visits as told by your health care provider. This is important. This information is not intended to replace advice given to you by your health care provider. Make sure you discuss any questions you have with your health care provider. Document Released: 09/20/2001 Document Revised: 03/03/2016 Document Reviewed: 11/27/2012 Elsevier Interactive Patient Education  2017 [employer].  COVID-19 Vaccines While Pregnant or Breastfeeding Updated April 07, 2020  Pregnant and recently pregnant people are more likely to get severely ill with COVID-19 compared with non-pregnant people. If you are pregnant, you can receive a COVID-19 vaccine. Getting a COVID-19 vaccine during pregnancy can protect you from severe illness from COVID-19. If you have questions about getting vaccinated, a conversation with your healthcare provider might help, but is not required for vaccination.  Pregnant and Recently Pregnant People Are at Increased Risk for Severe Illness from COVID-19 Although the overall risk of severe illness is low, pregnant and recently pregnant people are at an increased risk for severe illness from COVID-19 when compared with non-pregnant people. Severe illness includes illness that requires hospitalization, intensive care, or a ventilator or special equipment to breathe, or illness that results in death. Additionally, pregnant people with COVID-19 are at increased risk of preterm birth and might be at increased risk of other adverse pregnancy outcomes compared with pregnant women without COVID-19.  If you are facing a decision about whether to receive a COVID-19 vaccine while pregnant, consider:  Your risk of exposure to COVID-19 The risks of severe illness The known benefits of vaccination The limited but growing evidence about the safety of vaccinations during  pregnancy Limited Data Are Available about the Safety of COVID-19 Vaccines for People Who Are Pregnant Based on how these vaccines work in the body, experts believe they are unlikely to pose a risk for people who are pregnant. However, there are currently limited data on  the safety of COVID-19 vaccines in pregnant people.  Clinical trials that study the safety of COVID-19 vaccines and how well they work in pregnant people are underway or planned. Vaccine manufacturers are also collecting and reviewing data from people in the completed clinical trials who received vaccine and became pregnant. Studies in animals receiving a Moderna, Pfizer-BioNTech, or J&J/Janssen COVID-19 vaccine before or during pregnancy found no safety concerns in pregnant animals or their babies. The Centers for Disease Control and Prevention (CDC) and the [employer] Drug Administration (FDA) have safety monitoring systems in place to gather information about COVID-19 vaccination during pregnancy and will closely monitor that information. Early dataexternal icon from these systems are preliminary, but reassuring. These data did not identify any safety concerns for pregnant people who were vaccinated or for their babies. Most of the pregnancies reported in these systems are ongoing, so more follow-up data are needed for people vaccinated just before or early in pregnancy. We will continue to follow people vaccinated during all trimesters of pregnancy to understand effects on pregnancy and babies.  The Moderna and Pfizer-BioNTech vaccines are mRNA vaccines that do not contain the live virus that causes COVID-19 and therefore, cannot give someone COVID-19. Additionally, mRNA vaccines do not interact with a person's DNA or cause genetic changes because the mRNA does not enter the nucleus of the cell, which is where our DNA is kept. Learn more about how COVID-19 mRNA vaccines work.  The J&J/Janssen COVID-19 Vaccine is a viral vector vaccine,  meaning it uses a modified version of a different virus (the vector) to deliver important instructions to our cells. Vaccines that use the same viral vector have been given to pregnant people in all trimesters of pregnancy, including in a large-scale Ebola vaccination trial. No adverse pregnancy-related outcomes, including adverse outcomes that affected the infant, were associated with vaccination in these trials. Learn more about how viral vector vaccines work.  Johnson & Johnson's [employer] DibblesJanssen (J&J/Janssen) COVID-19 Vaccine: The Centers for Disease Control and Prevention (CDC) and the US Food and Drug Administration (FDA) recommended that use of (J&J/Janssen) COVID-19 Vaccine resume in the [place], effective January 31, 2020. However, women younger than 33 years old should especially be aware of the rare risk of blood clots with low platelets after vaccination. There are other COVID-19 vaccines available for which this risk has not been seen. If you received a J&J/Janssen COVID-19 Vaccine, here is what you need to know. Read the CDC/FDA statement.  If you are pregnant and receive a COVID-19 vaccine, consider participating in the v-safe pregnancy registry If you are pregnant and have received a COVID-19 vaccine, we encourage you to enroll in v-safe. V-safe is CDC's smartphone-based tool that uses text messaging and web surveys to provide personalized health check-ins after vaccination. A v-safe pregnancy registry has been established to gather information on the health of pregnant people who have received a COVID-19 vaccine. If people enrolled in v-safe report that they were pregnant at the time of vaccination or after vaccination, the registry staff might contact them to learn more. Participation is voluntary, and participants may opt out at any time.  Getting Vaccinated is a Optician, dispensingersonal Choice If you are pregnant, you can receive a COVID-19 vaccine. You may want to have a conversation with your healthcare  provider to help you decide whether to receive a vaccine that has been authorized for use under Emergency Use Authorization. While a conversation with your healthcare provider may be helpful, it is not required prior to  vaccination.  Key considerations you can discuss with your healthcare provider include:  How likely you are to being exposed to the virus that causes COVID-19 Risks of COVID-19 to you and the potential risks to your fetus or infant What is known about COVID-19 vaccines: How well they work to develop protection in the body Known side effects of vaccination Limited, but growing, information on the safety of COVID-19 vaccination during pregnancy How vaccination might pass antibodies to the fetus. Recent reports have shown that people who have received COVID-19 mRNA vaccines during pregnancy (mostly during their third trimester) have passed antibodies to their fetuses, which could help protect them after birth. If you are pregnant and have questions about COVID-19 vaccine If you would like to speak to someone about COVID-19 vaccination during pregnancy, please contact [employer]To[employer]. [employer] experts are available to answer questions in [place] or Spanish by phone or chat. The free and confidential service is available Monday-Friday 8am-5pm (local time). To reach [employer]:  Call [phone] Chat live or send an email MotherToBabyexternal icon Follow Recommendations to Prevent the Spread of COVID-19 after Vaccination If you are pregnant and decide to get vaccinated:  After you are fully vaccinated, you can resume activities that you did prior to the pandemic. Learn more about what you can do when you have been fully vaccinated.  If you have a condition or are taking medications that weaken your immune system, you may NOT be fully protected even if you are fully vaccinated. Talk to your healthcare provider. Even after vaccination, you may need to continue taking all  precautions.  Vaccine Side Effects Side effects can occur after receiving any of the available COVID-19 vaccines, especially after the second dose for vaccines that require two doses. Pregnant people have not reported different side effects from non-pregnant people after vaccination with mRNA vaccines (Moderna and Pfizer-BioNTech vaccines). If you experience fever following vaccination you should take acetaminophen (Tylenol) because fever --for any reason-- has been associated with adverse pregnancy outcomes. Learn more at What to Expect after Getting a COVID-19 Vaccine.  Although rare, some people have had allergic reactions after receiving a COVID-19 vaccine. Talk with your healthcare provider if you have a history of allergic reaction to any other vaccine or injectable therapy (intramuscular, intravenous, or subcutaneous).  Key considerations you can discuss with your healthcare provider include:  The unknown risks of developing a severe allergic reaction The benefits of vaccination If you have an allergic reaction after receiving a COVID-19 vaccine during pregnancy, you can receive treatment for it.  People Who Are Breastfeeding Clinical trials for the COVID-19 vaccines currently authorized for use under an Emergency Use Authorization in the [place] did not include people who are breastfeeding. Because the vaccines have not been studied on lactating people, there are no data available on the:  Safety of COVID-19 vaccines in lactating people Effects of vaccination on the breastfed baby Effects on milk production or excretion Based on how these vaccines work in the body, COVID-19 vaccines are thought not to be a risk to lactating people or their breastfeeding babies. Therefore, lactating people can receive a COVID-19 vaccine. Recent reports have shown that breastfeeding people who have received COVID-19 mRNA vaccines have antibodies in their breastmilk, which could help protect their  babies. More data are needed to determine what protection these antibodies may provide to the baby.  People Who Would Like to Have a Baby If trying to get pregnant now or in the future, would-be parents can receive  a COVID-19 vaccine.  There is currently no evidence that any vaccines, including COVID-19 vaccines, cause female or female fertility problems--problems getting pregnant. CDC does not recommend routine pregnancy testing before COVID-19 vaccination. If you are trying to become pregnant, you do not need to avoid pregnancy after receiving a COVID-19 vaccine. Like with all vaccines, scientists are studying COVID-19 vaccines carefully for side effects now and will report findings as they become available.

## 2020-06-23 NOTE — Progress Notes (Signed)
[place] 17+ 5 wks,breech,posterior placenta gr 0,SVP of fluid 4.5 cm,normal ovaries,fhr 157 bpm,EFW 195  g 29%,cx length 3.9 cm,limited view of heart because of fetal age,please have pt come back for additional image,no obvious abnormalities

## 2020-06-23 NOTE — Progress Notes (Signed)
LOW-RISK PREGNANCY VISIT Patient name: Morgan Nielsen MRN [id number]  Date of birth: 1986-09-01 Chief Complaint:   Routine Prenatal Visit ([place], 2nd IT and Pap today)  History of Present Illness:   Morgan Nielsen is a 34 y.o. G40P2002 female at [redacted]w[redacted]d with an Estimated Date of Delivery: 11/26/20 being seen today for ongoing management of a low-risk pregnancy.  Depression screen [facility] 2/9 05/20/2020 06/16/2017 05/12/2017  Decreased Interest 1 0 0  Down, Depressed, Hopeless 0 0 0  PHQ - 2 Score 1 0 0  Altered sleeping 2 0 0  Tired, decreased energy 2 2 2   Change in appetite 1 1 1   Feeling bad or failure about yourself  0 0 0  Trouble concentrating 0 0 0  Moving slowly or fidgety/restless 0 0 0  Suicidal thoughts 0 0 0  PHQ-9 Score 6 3 3     Today she reports no complaints. 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 Contractions: Not present. Vag. Bleeding: None.  Movement: Present. denies leaking of fluid. Review of Systems:   Pertinent items are noted in HPI Denies abnormal vaginal discharge w/ itching/odor/irritation, headaches, visual changes, shortness of breath, chest pain, abdominal pain, severe nausea/vomiting, or problems with urination or bowel movements unless otherwise stated above. Pertinent History Reviewed:  Reviewed past medical,surgical, social, obstetrical and family history.  Reviewed problem list, medications and allergies. Physical Assessment:   Vitals:   06/23/20 1035  BP: 105/62  Pulse: 64  Weight: 236 lb 3.2 oz (107.1 kg)  Body mass index is 34.88 kg/m.        Physical Examination:   General appearance: Well appearing, and in no distress  Mental status: Alert, oriented to person, place, and time  Skin: Warm & dry  Cardiovascular: Normal heart rate noted  Respiratory: Normal respiratory effort, no distress  Abdomen: Soft, gravid, nontender  Pelvic: thin prep pap obtained         Extremities: Edema: None  Fetal  Status: Fetal Heart Rate (bpm): 157 u/s   Movement: Present   17+ 5 wks,breech,posterior placenta gr 0,SVP of fluid 4.5 cm,normal ovaries,fhr 157 bpm,EFW 195         g 29%,cx length 3.9 cm,limited view of heart because of fetal age,please have pt come back for additional image,no obvious abnormalities   Chaperone: [facility]    Results for orders placed or performed in visit on 06/23/20 (from the past 24 hour(s))  POC Urinalysis Dipstick OB   Collection Time: 06/23/20 10:36 AM  Result Value Ref Range   Color, UA     Clarity, UA     Glucose, UA Negative Negative   Bilirubin, UA     Ketones, UA neg    Spec Grav, UA     Blood, UA neg    pH, UA     POC,PROTEIN,UA Negative Negative, Trace, Small (1+), Moderate (2+), Large (3+), 4+   Urobilinogen, UA     Nitrite, UA neg    Leukocytes, UA Trace (A) Negative   Appearance     Odor      Assessment & Plan:  1) Low-risk pregnancy G3P2002 at [redacted]w[redacted]d with an Estimated Date of Delivery: 11/26/20   2) H/O  GHTN, taking asa  3) H/O C/S> then VBAC, wants TOLAC  4) 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  Labs/procedures today: pap, 2nd IT, anatomy u/s  Plan:  Continue routine obstetrical care  Next visit: prefers will be in person for u/s    Reviewed: Preterm labor symptoms and general obstetric precautions including but not limited to vaginal bleeding, contractions, leaking of fluid and fetal movement were reviewed in detail with the patient.  All questions were answered. Has home bp cuff.  Check bp weekly, let us know if >140/90.   Follow-up: Return in about 4 weeks (around 07/21/2020) for LROB, US:OB F/U heart, in person, CNM.  Orders Placed This Encounter  Procedures  . US OB Follow Up  . INTEGRATED 2  . POC Urinalysis Dipstick OB   Cheral Marker CNM, [facility] 06/23/2020 11:13 AM

## 2020-06-25 LAB — INTEGRATED 2
AFP MoM: 2.22
Alpha-Fetoprotein: 67.3 ng/mL
Crown Rump Length: 65.9 mm
DIA MoM: 1.55
DIA Value: 185 pg/mL
Estriol, Unconjugated: 1.22 ng/mL
Gest. Age on Collection Date: 12.7 weeks
Gestational Age: 17.6 weeks
Maternal Age at EDD: 34.1 yr
Nuchal Translucency (NT): 1.5 mm
Nuchal Translucency MoM: 1.01
Number of Fetuses: 1
PAPP-A MoM: 1.68
PAPP-A Value: 1050.1 ng/mL
Test Results:: NEGATIVE
Weight: 234 [lb_av]
Weight: 236 [lb_av]
hCG MoM: 2.41
hCG Value: 45.6 IU/mL
uE3 MoM: 1.08

## 2020-06-25 LAB — CYTOLOGY - PAP
Comment: NEGATIVE
Diagnosis: NEGATIVE
High risk HPV: NEGATIVE

## 2020-07-21 ENCOUNTER — Ambulatory Visit (INDEPENDENT_AMBULATORY_CARE_PROVIDER_SITE_OTHER): Payer: Medicaid Other | Admitting: Women's Health

## 2020-07-21 ENCOUNTER — Encounter: Payer: Self-pay | Admitting: Women's Health

## 2020-07-21 ENCOUNTER — Ambulatory Visit (INDEPENDENT_AMBULATORY_CARE_PROVIDER_SITE_OTHER): Payer: Medicaid Other

## 2020-07-21 ENCOUNTER — Encounter: Payer: Medicaid Other | Admitting: Women's Health

## 2020-07-21 VITALS — BP 115/68 | HR 76 | Wt 246.0 lb

## 2020-07-21 DIAGNOSIS — Z3A21 21 weeks gestation of pregnancy: Secondary | ICD-10-CM | POA: Diagnosis not present

## 2020-07-21 DIAGNOSIS — Z331 Pregnant state, incidental: Secondary | ICD-10-CM

## 2020-07-21 DIAGNOSIS — Z1389 Encounter for screening for other disorder: Secondary | ICD-10-CM

## 2020-07-21 DIAGNOSIS — E039 Hypothyroidism, unspecified: Secondary | ICD-10-CM

## 2020-07-21 DIAGNOSIS — Z3480 Encounter for supervision of other normal pregnancy, unspecified trimester: Secondary | ICD-10-CM

## 2020-07-21 DIAGNOSIS — O99282 Endocrine, nutritional and metabolic diseases complicating pregnancy, second trimester: Secondary | ICD-10-CM

## 2020-07-21 DIAGNOSIS — Z362 Encounter for other antenatal screening follow-up: Secondary | ICD-10-CM

## 2020-07-21 DIAGNOSIS — Z3482 Encounter for supervision of other normal pregnancy, second trimester: Secondary | ICD-10-CM

## 2020-07-21 DIAGNOSIS — O99012 Anemia complicating pregnancy, second trimester: Secondary | ICD-10-CM

## 2020-07-21 LAB — POCT URINALYSIS DIPSTICK OB
Blood, UA: NEGATIVE
Glucose, UA: NEGATIVE
Ketones, UA: NEGATIVE
Leukocytes, UA: NEGATIVE
Nitrite, UA: NEGATIVE
POC,PROTEIN,UA: NEGATIVE

## 2020-07-21 NOTE — Progress Notes (Signed)
LOW-RISK PREGNANCY VISIT Patient name: Morgan Nielsen MRN [id number]  Date of birth: 1986-05-18 Chief Complaint:   Routine Prenatal Visit (U/S)  History of Present Illness:   Morgan Nielsen is a 34 y.o. G11P2002 female at [redacted]w[redacted]d with an Estimated Date of Delivery: 11/26/20 being seen today for ongoing management of a low-risk pregnancy.  Depression screen [facility] 2/9 05/20/2020 06/16/2017 05/12/2017  Decreased Interest 1 0 0  Down, Depressed, Hopeless 0 0 0  PHQ - 2 Score 1 0 0  Altered sleeping 2 0 0  Tired, decreased energy 2 2 2   Change in appetite 1 1 1   Feeling bad or failure about yourself  0 0 0  Trouble concentrating 0 0 0  Moving slowly or fidgety/restless 0 0 0  Suicidal thoughts 0 0 0  PHQ-9 Score 6 3 3     Today she reports very tired, fatigued. Taking Fe as directed. Contractions: Not present. Vag. Bleeding: None.  Movement: Present. denies leaking of fluid. Review of Systems:   Pertinent items are noted in HPI Denies abnormal vaginal discharge w/ itching/odor/irritation, headaches, visual changes, shortness of breath, chest pain, abdominal pain, severe nausea/vomiting, or problems with urination or bowel movements unless otherwise stated above. Pertinent History Reviewed:  Reviewed past medical,surgical, social, obstetrical and family history.  Reviewed problem list, medications and allergies. Physical Assessment:   Vitals:   07/21/20 1140  BP: 115/68  Pulse: 76  Weight: 246 lb (111.6 kg)  Body mass index is 36.33 kg/m.        Physical Examination:   General appearance: Well appearing, and in no distress  Mental status: Alert, oriented to person, place, and time  Skin: Warm & dry  Cardiovascular: Normal heart rate noted  Respiratory: Normal respiratory effort, no distress  Abdomen: Soft, gravid, nontender  Pelvic: Cervical exam deferred         Extremities: Edema: None  Fetal Status:     Movement: Present   21+5 wks,cephalic,posterior placenta gr 0,cx 3.9  cm,fhr 144 bpm,EFW 475 g 63%,anatomy of the heart complete,no obvious abnormalities   Chaperone: n/a    Results for orders placed or performed in visit on 07/21/20 (from the past 24 hour(s))  POC Urinalysis Dipstick OB   Collection Time: 07/21/20 11:41 AM  Result Value Ref Range   Color, UA     Clarity, UA     Glucose, UA Negative Negative   Bilirubin, UA     Ketones, UA neg    Spec Grav, UA     Blood, UA neg    pH, UA     POC,PROTEIN,UA Negative Negative, Trace, Small (1+), Moderate (2+), Large (3+), 4+   Urobilinogen, UA     Nitrite, UA neg    Leukocytes, UA Negative Negative   Appearance     Odor      Assessment & Plan:  1) Low-risk pregnancy G3P2002 at [redacted]w[redacted]d with an Estimated Date of Delivery: 11/26/20   2) H/O GHTN, taking asa  3) Prev C/S> then VBAC, wants TOLAC  4) H/O PE> on Lovenox 40mg  daily  5) Hypothyroidism> on synthroid 09/20/20, check TSH today, needs refill-has 2wks worth left (will wait til results come in)  6) Anemia> repeat cbc today   Meds: No orders of the defined types were placed in this encounter.  Labs/procedures today: repeat anatomy u/s, cbc, tsh  Plan:  Continue routine obstetrical care  Next visit: prefers will be in person for pn2    Reviewed: Preterm labor  symptoms and general obstetric precautions including but not limited to vaginal bleeding, contractions, leaking of fluid and fetal movement were reviewed in detail with the patient.  All questions were answered. Has home bp cuff. Check bp weekly, let us know if >140/90.   Follow-up: Return in about 30 days (around 08/20/2020) for LROB, PN2, in person, CNM.  Orders Placed This Encounter  Procedures  . TSH  . CBC  . POC Urinalysis Dipstick OB   Cheral Marker CNM, [facility] 07/21/2020 12:22 PM

## 2020-07-21 NOTE — Progress Notes (Signed)
[place] 21+5 wks,cephalic,posterior placenta gr 0,cx 3.9 cm,fhr 144 bpm,EFW 475 g 63%,anatomy of the heart complete,no obvious abnormalities

## 2020-07-21 NOTE — Patient Instructions (Signed)
Morgan Nielsen, I greatly value your feedback.  If you receive a survey following your visit with [place] today, we appreciate you taking the time to fill it out.  Thanks, [name], CNM, WHNP-BC  [facility] ([address] [place], [place] [address]) Entrance C, located off of [address] valet parking  Go to [email or web address] to register for FREE online childbirth classes  [place] Pediatricians/Family Doctors:  [facility] [phone]            [facility] [phone]                 [facility] [phone] (usually not accepting new patients unless you have family there already, you are always welcome to call and ask)       [facility] Department [phone]       [facility] Pediatricians/Family Doctors:   [facility]: [phone]  [facility]: [phone]  [facility]: [phone]  [facility] Doctors:   [facility]: [phone]   [facility]: [phone]  [facility] Doctors:  [facility]: [phone]    Home Blood Pressure Monitoring for Patients   Your provider has recommended that you check your blood pressure (BP) at least once a week at home. If you do not have a blood pressure cuff at home, one will be provided for you. Contact your provider if you have not received your monitor within 1 week.   Helpful Tips for Accurate Home Blood Pressure Checks  . Don't smoke, exercise, or drink caffeine 30 minutes before checking your BP . Use the restroom before checking your BP (a full bladder can raise your pressure) . Relax in a comfortable upright chair . Feet on the ground . Left arm resting comfortably on a flat surface at the level of your heart . Legs uncrossed . Back supported . Sit quietly and don't talk . Place the cuff on your bare arm . Adjust snuggly, so  that only two fingertips can fit between your skin and the top of the cuff . Check 2 readings separated by at least one minute . Keep a log of your BP readings . For a visual, please reference this diagram: http://ccnc.care/bpdiagram  Provider Name: [facility]     Phone: [phone]  Zone 1: ALL CLEAR  Continue to monitor your symptoms:  . BP reading is less than 140 (top number) or less than 90 (bottom number)  . No right upper stomach pain . No headaches or seeing spots . No feeling nauseated or throwing up . No swelling in face and hands  Zone 2: CAUTION Call your doctor's office for any of the following:  . BP reading is greater than 140 (top number) or greater than 90 (bottom number)  . Stomach pain under your ribs in the middle or right side . Headaches or seeing spots . Feeling nauseated or throwing up . Swelling in face and hands  Zone 3: EMERGENCY  Seek immediate medical care if you have any of the following:  . BP reading is greater than160 (top number) or greater than 110 (bottom number) . Severe headaches not improving with Tylenol . Serious difficulty catching your breath . Any worsening symptoms from Zone 2     Second Trimester of Pregnancy The second trimester is from week 14 through week 27 (months 4 through 6). The second trimester is often a time when you feel your  best. Your body has adjusted to being pregnant, and you begin to feel better physically. Usually, morning sickness has lessened or quit completely, you may have more energy, and you may have an increase in appetite. The second trimester is also a time when the fetus is growing rapidly. At the end of the sixth month, the fetus is about 9 inches long and weighs about 1 pounds. You will likely begin to feel the baby move (quickening) between 16 and 20 weeks of pregnancy. Body changes during your second trimester Your body continues to go through many changes during your second trimester. The  changes vary from woman to woman.  Your weight will continue to increase. You will notice your lower abdomen bulging out.  You may begin to get stretch marks on your hips, abdomen, and breasts.  You may develop headaches that can be relieved by medicines. The medicines should be approved by your health care provider.  You may urinate more often because the fetus is pressing on your bladder.  You may develop or continue to have heartburn as a result of your pregnancy.  You may develop constipation because certain hormones are causing the muscles that push waste through your intestines to slow down.  You may develop hemorrhoids or swollen, bulging veins (varicose veins).  You may have back pain. This is caused by: ? Weight gain. ? Pregnancy hormones that are relaxing the joints in your pelvis. ? A shift in weight and the muscles that support your balance.  Your breasts will continue to grow and they will continue to become tender.  Your gums may bleed and may be sensitive to brushing and flossing.  Dark spots or blotches (chloasma, mask of pregnancy) may develop on your face. This will likely fade after the baby is born.  A dark line from your belly button to the pubic area (linea nigra) may appear. This will likely fade after the baby is born.  You may have changes in your hair. These can include thickening of your hair, rapid growth, and changes in texture. Some women also have hair loss during or after pregnancy, or hair that feels dry or thin. Your hair will most likely return to normal after your baby is born.  What to expect at prenatal visits During a routine prenatal visit:  You will be weighed to make sure you and the fetus are growing normally.  Your blood pressure will be taken.  Your abdomen will be measured to track your baby's growth.  The fetal heartbeat will be listened to.  Any test results from the previous visit will be discussed.  Your health care  provider may ask you:  How you are feeling.  If you are feeling the baby move.  If you have had any abnormal symptoms, such as leaking fluid, bleeding, severe headaches, or abdominal cramping.  If you are using any tobacco products, including cigarettes, chewing tobacco, and electronic cigarettes.  If you have any questions.  Other tests that may be performed during your second trimester include:  Blood tests that check for: ? Low iron levels (anemia). ? High blood sugar that affects pregnant women (gestational diabetes) between 71 and 28 weeks. ? Rh antibodies. This is to check for a protein on red blood cells (Rh factor).  Urine tests to check for infections, diabetes, or protein in the urine.  An ultrasound to confirm the proper growth and development of the baby.  An amniocentesis to check for possible genetic problems.  Fetal  screens for spina bifida and Down syndrome.  HIV (human immunodeficiency virus) testing. Routine prenatal testing includes screening for HIV, unless you choose not to have this test.  Follow these instructions at home: Medicines  Follow your health care provider's instructions regarding medicine use. Specific medicines may be either safe or unsafe to take during pregnancy.  Take a prenatal vitamin that contains at least 600 micrograms (mcg) of folic acid.  If you develop constipation, try taking a stool softener if your health care provider approves. Eating and drinking  Eat a balanced diet that includes fresh fruits and vegetables, whole grains, good sources of protein such as meat, eggs, or tofu, and low-fat dairy. Your health care provider will help you determine the amount of weight gain that is right for you.  Avoid raw meat and uncooked cheese. These carry germs that can cause birth defects in the baby.  If you have low calcium intake from food, talk to your health care provider about whether you should take a daily calcium supplement.   Limit foods that are high in fat and processed sugars, such as fried and sweet foods.  To prevent constipation: ? Drink enough fluid to keep your urine clear or pale yellow. ? Eat foods that are high in fiber, such as fresh fruits and vegetables, whole grains, and beans. Activity  Exercise only as directed by your health care provider. Most women can continue their usual exercise routine during pregnancy. Try to exercise for 30 minutes at least 5 days a week. Stop exercising if you experience uterine contractions.  Avoid heavy lifting, wear low heel shoes, and practice good posture.  A sexual relationship may be continued unless your health care provider directs you otherwise. Relieving pain and discomfort  Wear a good support bra to prevent discomfort from breast tenderness.  Take warm sitz baths to soothe any pain or discomfort caused by hemorrhoids. Use hemorrhoid cream if your health care provider approves.  Rest with your legs elevated if you have leg cramps or low back pain.  If you develop varicose veins, wear support hose. Elevate your feet for 15 minutes, 3-4 times a day. Limit salt in your diet. Prenatal Care  Write down your questions. Take them to your prenatal visits.  Keep all your prenatal visits as told by your health care provider. This is important. Safety  Wear your seat belt at all times when driving.  Make a list of emergency phone numbers, including numbers for family, friends, the hospital, and police and fire departments. General instructions  Ask your health care provider for a referral to a local prenatal education class. Begin classes no later than the beginning of month 6 of your pregnancy.  Ask for help if you have counseling or nutritional needs during pregnancy. Your health care provider can offer advice or refer you to specialists for help with various needs.  Do not use hot tubs, steam rooms, or saunas.  Do not douche or use tampons or scented  sanitary pads.  Do not cross your legs for long periods of time.  Avoid cat litter boxes and soil used by cats. These carry germs that can cause birth defects in the baby and possibly loss of the fetus by miscarriage or stillbirth.  Avoid all smoking, herbs, alcohol, and unprescribed drugs. Chemicals in these products can affect the formation and growth of the baby.  Do not use any products that contain nicotine or tobacco, such as cigarettes and e-cigarettes. If you need help  quitting, ask your health care provider.  Visit your dentist if you have not gone yet during your pregnancy. Use a soft toothbrush to brush your teeth and be gentle when you floss. Contact a health care provider if:  You have dizziness.  You have mild pelvic cramps, pelvic pressure, or nagging pain in the abdominal area.  You have persistent nausea, vomiting, or diarrhea.  You have a bad smelling vaginal discharge.  You have pain when you urinate. Get help right away if:  You have a fever.  You are leaking fluid from your vagina.  You have spotting or bleeding from your vagina.  You have severe abdominal cramping or pain.  You have rapid weight gain or weight loss.  You have shortness of breath with chest pain.  You notice sudden or extreme swelling of your face, hands, ankles, feet, or legs.  You have not felt your baby move in over an hour.  You have severe headaches that do not go away when you take medicine.  You have vision changes. Summary  The second trimester is from week 14 through week 27 (months 4 through 6). It is also a time when the fetus is growing rapidly.  Your body goes through many changes during pregnancy. The changes vary from woman to woman.  Avoid all smoking, herbs, alcohol, and unprescribed drugs. These chemicals affect the formation and growth your baby.  Do not use any tobacco products, such as cigarettes, chewing tobacco, and e-cigarettes. If you need help quitting,  ask your health care provider.  Contact your health care provider if you have any questions. Keep all prenatal visits as told by your health care provider. This is important. This information is not intended to replace advice given to you by your health care provider. Make sure you discuss any questions you have with your health care provider. Document Released: 09/20/2001 Document Revised: 03/03/2016 Document Reviewed: 11/27/2012 Elsevier Interactive Patient Education  2017 [employer].

## 2020-07-22 LAB — CBC
Hematocrit: 37.5 % (ref 34.0–46.6)
Hemoglobin: 11.8 g/dL (ref 11.1–15.9)
MCH: 26.6 pg (ref 26.6–33.0)
MCHC: 31.5 g/dL (ref 31.5–35.7)
MCV: 85 fL (ref 79–97)
Platelets: 201 10*3/uL (ref 150–450)
RBC: 4.43 x10E6/uL (ref 3.77–5.28)
RDW: 16.9 % — ABNORMAL HIGH (ref 11.7–15.4)
WBC: 12 10*3/uL — ABNORMAL HIGH (ref 3.4–10.8)

## 2020-07-22 LAB — TSH: TSH: 2.12 u[IU]/mL (ref 0.450–4.500)

## 2020-08-03 ENCOUNTER — Encounter: Payer: Self-pay | Admitting: *Deleted

## 2020-08-03 ENCOUNTER — Other Ambulatory Visit (INDEPENDENT_AMBULATORY_CARE_PROVIDER_SITE_OTHER): Payer: Medicaid Other | Admitting: *Deleted

## 2020-08-03 VITALS — BP 104/62 | HR 75

## 2020-08-03 DIAGNOSIS — R8290 Unspecified abnormal findings in urine: Secondary | ICD-10-CM

## 2020-08-03 DIAGNOSIS — Z3480 Encounter for supervision of other normal pregnancy, unspecified trimester: Secondary | ICD-10-CM

## 2020-08-03 DIAGNOSIS — Z331 Pregnant state, incidental: Secondary | ICD-10-CM

## 2020-08-03 DIAGNOSIS — Z1389 Encounter for screening for other disorder: Secondary | ICD-10-CM

## 2020-08-03 DIAGNOSIS — R300 Dysuria: Secondary | ICD-10-CM

## 2020-08-03 LAB — POCT URINALYSIS DIPSTICK OB
Blood, UA: NEGATIVE
Glucose, UA: NEGATIVE
Ketones, UA: NEGATIVE
Leukocytes, UA: NEGATIVE
Nitrite, UA: NEGATIVE
POC,PROTEIN,UA: NEGATIVE

## 2020-08-03 MED ORDER — NITROFURANTOIN MONOHYD MACRO 100 MG PO CAPS: 100.0000 mg | ORAL_CAPSULE | Freq: Two times a day (BID) | ORAL | 0 refills | Status: DC

## 2020-08-03 NOTE — Addendum Note (Signed)
Addended by: Shawna Clamp R on: 08/03/2020 01:08 PM   Modules accepted: Orders

## 2020-08-03 NOTE — Progress Notes (Addendum)
   NURSE VISIT- UTI SYMPTOMS   SUBJECTIVE:  Morgan Nielsen is a 34 y.o. G11P2002 female here for UTI symptoms. She is [redacted]w[redacted]d pregnant. She reports dysuria and foul odor since last week.   OBJECTIVE:  BP 104/62 (BP Location: Left Arm, Patient Position: Sitting, Cuff Size: Large)   Pulse 75   LMP 02/20/2020 (Exact Date)   Appears well, in no apparent distress  Results for orders placed or performed in visit on 08/03/20 (from the past 24 hour(s))  POC Urinalysis Dipstick OB   Collection Time: 08/03/20  9:35 AM  Result Value Ref Range   Color, UA     Clarity, UA     Glucose, UA Negative Negative   Bilirubin, UA     Ketones, UA neg    Spec Grav, UA     Blood, UA neg    pH, UA     POC,PROTEIN,UA Negative Negative, Trace, Small (1+), Moderate (2+), Large (3+), 4+   Urobilinogen, UA     Nitrite, UA neg    Leukocytes, UA Negative Negative   Appearance     Odor      ASSESSMENT: Pregnancy [redacted]w[redacted]d with UTI symptoms and negative nitrites  PLAN: Discussed with [name], CNM, [facility]   Rx sent by provider today: Yes Urine culture sent Call or return to clinic prn if these symptoms worsen or fail to improve as anticipated. Follow-up: as scheduled   Jobe Marker  08/03/2020 9:36 AM   Chart reviewed for nurse visit. Agree with plan of care. Rx macrobid, send culture. Cheral Marker, [place] 08/03/2020 1:08 PM

## 2020-08-06 LAB — URINE CULTURE

## 2020-08-14 ENCOUNTER — Other Ambulatory Visit: Payer: Self-pay | Admitting: Women's Health

## 2020-08-14 MED ORDER — LEVOTHYROXINE SODIUM 150 MCG PO TABS
150.0000 ug | ORAL_TABLET | Freq: Every day | ORAL | 6 refills | Status: DC
Start: 2020-08-14 — End: 2021-06-21

## 2020-08-24 ENCOUNTER — Other Ambulatory Visit: Payer: Medicaid Other

## 2020-08-24 ENCOUNTER — Ambulatory Visit (INDEPENDENT_AMBULATORY_CARE_PROVIDER_SITE_OTHER): Payer: Medicaid Other | Admitting: Women's Health

## 2020-08-24 ENCOUNTER — Encounter: Payer: Self-pay | Admitting: Women's Health

## 2020-08-24 ENCOUNTER — Other Ambulatory Visit: Payer: Self-pay

## 2020-08-24 VITALS — BP 107/62 | HR 80 | Wt 245.0 lb

## 2020-08-24 DIAGNOSIS — Z3480 Encounter for supervision of other normal pregnancy, unspecified trimester: Secondary | ICD-10-CM

## 2020-08-24 DIAGNOSIS — Z3A26 26 weeks gestation of pregnancy: Secondary | ICD-10-CM

## 2020-08-24 DIAGNOSIS — Z131 Encounter for screening for diabetes mellitus: Secondary | ICD-10-CM

## 2020-08-24 DIAGNOSIS — Z23 Encounter for immunization: Secondary | ICD-10-CM

## 2020-08-24 DIAGNOSIS — Z1389 Encounter for screening for other disorder: Secondary | ICD-10-CM

## 2020-08-24 DIAGNOSIS — Z3482 Encounter for supervision of other normal pregnancy, second trimester: Secondary | ICD-10-CM

## 2020-08-24 DIAGNOSIS — Z331 Pregnant state, incidental: Secondary | ICD-10-CM

## 2020-08-24 LAB — POCT URINALYSIS DIPSTICK OB
Blood, UA: NEGATIVE
Glucose, UA: NEGATIVE
Ketones, UA: NEGATIVE
Leukocytes, UA: NEGATIVE
Nitrite, UA: NEGATIVE
POC,PROTEIN,UA: NEGATIVE

## 2020-08-24 NOTE — Progress Notes (Signed)
LOW-RISK PREGNANCY VISIT Patient name: Morgan Nielsen MRN [id number]  Date of birth: [birth year] Chief Complaint:   Routine Prenatal Visit (PN2)  History of Present Illness:   Morgan Nielsen is a 34 y.o. G85P2002 female at [redacted]w[redacted]d with an Estimated Date of Delivery: 11/26/20 being seen today for ongoing management of a low-risk pregnancy.  Depression screen [facility] 2/9 08/24/2020 05/20/2020 06/16/2017 05/12/2017  Decreased Interest 0 1 0 0  Down, Depressed, Hopeless 0 0 0 0  PHQ - 2 Score 0 1 0 0  Altered sleeping 0 2 0 0  Tired, decreased energy 3 2 2 2   Change in appetite 0 1 1 1   Feeling bad or failure about yourself  0 0 0 0  Trouble concentrating 0 0 0 0  Moving slowly or fidgety/restless 0 0 0 0  Suicidal thoughts 0 0 0 0  PHQ-9 Score 3 6 3 3     Today she reports Rt wrist pain/fingers numb at night/in am. Tired lately. Contractions: Not present. Vag. Bleeding: None.  Movement: Present. denies leaking of fluid. Review of Systems:   Pertinent items are noted in HPI Denies abnormal vaginal discharge w/ itching/odor/irritation, headaches, visual changes, shortness of breath, chest pain, abdominal pain, severe nausea/vomiting, or problems with urination or bowel movements unless otherwise stated above. Pertinent History Reviewed:  Reviewed past medical,surgical, social, obstetrical and family history.  Reviewed problem list, medications and allergies. Physical Assessment:   Vitals:   08/24/20 0905  BP: 107/62  Pulse: 80  Weight: 245 lb (111.1 kg)  Body mass index is 36.18 kg/m.        Physical Examination:   General appearance: Well appearing, and in no distress  Mental status: Alert, oriented to person, place, and time  Skin: Warm & dry  Cardiovascular: Normal heart rate noted  Respiratory: Normal respiratory effort, no distress  Abdomen: Soft, gravid, nontender  Pelvic: Cervical exam deferred         Extremities: Edema: None  Fetal Status: Fetal Heart Rate (bpm): 155  Fundal Height: 28 cm Movement: Present    Chaperone: N/A   Results for orders placed or performed in visit on 08/24/20 (from the past 24 hour(s))  POC Urinalysis Dipstick OB   Collection Time: 08/24/20  9:11 AM  Result Value Ref Range   Color, UA     Clarity, UA     Glucose, UA Negative Negative   Bilirubin, UA     Ketones, UA neg    Spec Grav, UA     Blood, UA neg    pH, UA     POC,PROTEIN,UA Negative Negative, Trace, Small (1+), Moderate (2+), Large (3+), 4+   Urobilinogen, UA     Nitrite, UA neg    Leukocytes, UA Negative Negative   Appearance     Odor      Assessment & Plan:  1) Low-risk pregnancy G3P2002 at [redacted]w[redacted]d with an Estimated Date of Delivery: 11/26/20   2) H/O PE, on Lovenox  3) Prev c/s> wants TOLAC  4) Hypothyroidism> on synthroid  5) CTS> wear wrist splint at night   Meds: No orders of the defined types were placed in this encounter.  Labs/procedures today: pn2, flu shot, wants tdap next visit w/ rhogam  Plan:  Continue routine obstetrical care  Next visit: prefers in person    Reviewed: Preterm labor symptoms and general obstetric precautions including but not limited to vaginal bleeding, contractions, leaking of fluid and fetal movement were reviewed in detail with  the patient.  All questions were answered. Has home bp cuff.  Check bp weekly, let us know if >140/90.   Follow-up: Return in about 4 weeks (around 09/21/2020) for LROB, CNM, in person, rhogam.  Orders Placed This Encounter  Procedures  . Flu Vaccine QUAD 36+ mos IM  . POC Urinalysis Dipstick OB   Cheral Marker CNM, [facility] 08/24/2020 9:43 AM

## 2020-08-24 NOTE — Patient Instructions (Signed)
Morgan Nielsen, I greatly value your feedback.  If you receive a survey following your visit with [place] today, we appreciate you taking the time to fill it out.  Thanks, [name], CNM, WHNP-BC   [facility] at [facility] ([address] [place], [place] [address]) Entrance C, located off of [address] valet parking  Go to [email or web address] to register for FREE online childbirth classes   Call the office [phone]) or go to [facility] if:  You begin to have strong, frequent contractions  Your water breaks.  Sometimes it is a big gush of fluid, sometimes it is just a trickle that keeps getting your panties wet or running down your legs  You have vaginal bleeding.  It is normal to have a small amount of spotting if your cervix was checked.   You don't feel your baby moving like normal.  If you don't, get you something to eat and drink and lay down and focus on feeling your baby move.  You should feel at least 10 movements in 2 hours.  If you don't, you should call the office or go to [facility].    Tdap Vaccine  It is recommended that you get the Tdap vaccine during the third trimester of EACH pregnancy to help protect your baby from getting pertussis (whooping cough)  27-36 weeks is the BEST time to do this so that you can pass the protection on to your baby. During pregnancy is better than after pregnancy, but if you are unable to get it during pregnancy it will be offered at the hospital.   You can get this vaccine with [place], at the health department, your family doctor, or some local pharmacies  Everyone who will be around your baby should also be up-to-date on their vaccines before the baby comes. Adults (who are not pregnant) only need 1 dose of Tdap during adulthood.   [place] Pediatricians/Family Doctors:  [facility] [phone]            [facility] [phone]                 [facility]  [phone] (usually not accepting new patients unless you have family there already, you are always welcome to call and ask)       [facility] [phone]       [facility] Pediatricians/Family Doctors:   [facility]: [phone]  [facility]: [phone]  [facility]: [phone]  [facility] Doctors:   [facility]: [phone]   [facility]: [phone]  [facility] Doctors:  [facility]: [phone]   Home Blood Pressure Monitoring for Patients   Your provider has recommended that you check your blood pressure (BP) at least once a week at home. If you do not have a blood pressure cuff at home, one will be provided for you. Contact your provider if you have not received your monitor within 1 week.   Helpful Tips for Accurate Home Blood Pressure Checks  . Don't smoke, exercise, or drink caffeine 30 minutes before checking your BP . Use the restroom before checking your BP (a full bladder can raise your pressure) . Relax in a comfortable upright chair . Feet on the ground . Left arm resting comfortably on a flat surface at the level of your heart . Legs uncrossed . Back supported . Sit quietly and don't talk . Place the cuff on your  bare arm . Adjust snuggly, so that only two fingertips can fit between your skin and the top of the cuff . Check 2 readings separated by at least one minute . Keep a log of your BP readings . For a visual, please reference this diagram: http://ccnc.care/bpdiagram  Provider Name: [facility]     Phone: [phone]  Zone 1: ALL CLEAR  Continue to monitor your symptoms:  . BP reading is less than 140 (top number) or less than 90 (bottom number)  . No right upper stomach pain . No headaches or seeing spots . No feeling nauseated or throwing up . No swelling in face and hands  Zone 2: CAUTION Call your  doctor's office for any of the following:  . BP reading is greater than 140 (top number) or greater than 90 (bottom number)  . Stomach pain under your ribs in the middle or right side . Headaches or seeing spots . Feeling nauseated or throwing up . Swelling in face and hands  Zone 3: EMERGENCY  Seek immediate medical care if you have any of the following:  . BP reading is greater than160 (top number) or greater than 110 (bottom number) . Severe headaches not improving with Tylenol . Serious difficulty catching your breath . Any worsening symptoms from Zone 2   Third Trimester of Pregnancy The third trimester is from week 29 through week 42, months 7 through 9. The third trimester is a time when the fetus is growing rapidly. At the end of the ninth month, the fetus is about 20 inches in length and weighs 6-10 pounds.  BODY CHANGES Your body goes through many changes during pregnancy. The changes vary from woman to woman.   Your weight will continue to increase. You can expect to gain 25-35 pounds (11-16 kg) by the end of the pregnancy.  You may begin to get stretch marks on your hips, abdomen, and breasts.  You may urinate more often because the fetus is moving lower into your pelvis and pressing on your bladder.  You may develop or continue to have heartburn as a result of your pregnancy.  You may develop constipation because certain hormones are causing the muscles that push waste through your intestines to slow down.  You may develop hemorrhoids or swollen, bulging veins (varicose veins).  You may have pelvic pain because of the weight gain and pregnancy hormones relaxing your joints between the bones in your pelvis. Backaches may result from overexertion of the muscles supporting your posture.  You may have changes in your hair. These can include thickening of your hair, rapid growth, and changes in texture. Some women also have hair loss during or after pregnancy, or hair that  feels dry or thin. Your hair will most likely return to normal after your baby is born.  Your breasts will continue to grow and be tender. A yellow discharge may leak from your breasts called colostrum.  Your belly button may stick out.  You may feel short of breath because of your expanding uterus.  You may notice the fetus "dropping," or moving lower in your abdomen.  You may have a bloody mucus discharge. This usually occurs a few days to a week before labor begins.  Your cervix becomes thin and soft (effaced) near your due date. WHAT TO EXPECT AT YOUR PRENATAL EXAMS  You will have prenatal exams every 2 weeks until week 36. Then, you will have weekly prenatal exams. During a routine prenatal visit:  You will be weighed to make sure you and the fetus are growing normally.  Your blood pressure is taken.  Your abdomen will be measured to track your baby's growth.  The fetal heartbeat will be listened to.  Any test results from the previous visit will be discussed.  You may have a cervical check near your due date to see if you have effaced. At around 36 weeks, your caregiver will check your cervix. At the same time, your caregiver will also perform a test on the secretions of the vaginal tissue. This test is to determine if a type of bacteria, Group B streptococcus, is present. Your caregiver will explain this further. Your caregiver may ask you:  What your birth plan is.  How you are feeling.  If you are feeling the baby move.  If you have had any abnormal symptoms, such as leaking fluid, bleeding, severe headaches, or abdominal cramping.  If you have any questions. Other tests or screenings that may be performed during your third trimester include:  Blood tests that check for low iron levels (anemia).  Fetal testing to check the health, activity level, and growth of the fetus. Testing is done if you have certain medical conditions or if there are problems during the  pregnancy. FALSE LABOR You may feel small, irregular contractions that eventually go away. These are called Braxton Hicks contractions, or false labor. Contractions may last for hours, days, or even weeks before true labor sets in. If contractions come at regular intervals, intensify, or become painful, it is best to be seen by your caregiver.  SIGNS OF LABOR   Menstrual-like cramps.  Contractions that are 5 minutes apart or less.  Contractions that start on the top of the uterus and spread down to the lower abdomen and back.  A sense of increased pelvic pressure or back pain.  A watery or bloody mucus discharge that comes from the vagina. If you have any of these signs before the 37th week of pregnancy, call your caregiver right away. You need to go to the hospital to get checked immediately. HOME CARE INSTRUCTIONS   Avoid all smoking, herbs, alcohol, and unprescribed drugs. These chemicals affect the formation and growth of the baby.  Follow your caregiver's instructions regarding medicine use. There are medicines that are either safe or unsafe to take during pregnancy.  Exercise only as directed by your caregiver. Experiencing uterine cramps is a good sign to stop exercising.  Continue to eat regular, healthy meals.  Wear a good support bra for breast tenderness.  Do not use hot tubs, steam rooms, or saunas.  Wear your seat belt at all times when driving.  Avoid raw meat, uncooked cheese, cat litter boxes, and soil used by cats. These carry germs that can cause birth defects in the baby.  Take your prenatal vitamins.  Try taking a stool softener (if your caregiver approves) if you develop constipation. Eat more high-fiber foods, such as fresh vegetables or fruit and whole grains. Drink plenty of fluids to keep your urine clear or pale yellow.  Take warm sitz baths to soothe any pain or discomfort caused by hemorrhoids. Use hemorrhoid cream if your caregiver approves.  If you  develop varicose veins, wear support hose. Elevate your feet for 15 minutes, 3-4 times a day. Limit salt in your diet.  Avoid heavy lifting, wear low heal shoes, and practice good posture.  Rest a lot with your legs elevated if you have leg cramps or low  back pain.  Visit your dentist if you have not gone during your pregnancy. Use a soft toothbrush to brush your teeth and be gentle when you floss.  A sexual relationship may be continued unless your caregiver directs you otherwise.  Do not travel far distances unless it is absolutely necessary and only with the approval of your caregiver.  Take prenatal classes to understand, practice, and ask questions about the labor and delivery.  Make a trial run to the hospital.  Pack your hospital bag.  Prepare the baby's nursery.  Continue to go to all your prenatal visits as directed by your caregiver. SEEK MEDICAL CARE IF:  You are unsure if you are in labor or if your water has broken.  You have dizziness.  You have mild pelvic cramps, pelvic pressure, or nagging pain in your abdominal area.  You have persistent nausea, vomiting, or diarrhea.  You have a bad smelling vaginal discharge.  You have pain with urination. SEEK IMMEDIATE MEDICAL CARE IF:   You have a fever.  You are leaking fluid from your vagina.  You have spotting or bleeding from your vagina.  You have severe abdominal cramping or pain.  You have rapid weight loss or gain.  You have shortness of breath with chest pain.  You notice sudden or extreme swelling of your face, hands, ankles, feet, or legs.  You have not felt your baby move in over an hour.  You have severe headaches that do not go away with medicine.  You have vision changes. Document Released: 09/20/2001 Document Revised: 10/01/2013 Document Reviewed: 11/27/2012 [facility] Patient Information 2015 [place], [place]. This information is not intended to replace advice given to you by your health  care provider. Make sure you discuss any questions you have with your health care provider.

## 2020-08-25 LAB — GLUCOSE TOLERANCE, 2 HOURS W/ 1HR
Glucose, 1 hour: 129 mg/dL (ref 65–179)
Glucose, 2 hour: 51 mg/dL — ABNORMAL LOW (ref 65–152)
Glucose, Fasting: 76 mg/dL (ref 65–91)

## 2020-08-25 LAB — CBC
Hematocrit: 36.4 % (ref 34.0–46.6)
Hemoglobin: 12.1 g/dL (ref 11.1–15.9)
MCH: 28 pg (ref 26.6–33.0)
MCHC: 33.2 g/dL (ref 31.5–35.7)
MCV: 84 fL (ref 79–97)
Platelets: 197 10*3/uL (ref 150–450)
RBC: 4.32 x10E6/uL (ref 3.77–5.28)
RDW: 15 % (ref 11.7–15.4)
WBC: 9.8 10*3/uL (ref 3.4–10.8)

## 2020-08-25 LAB — HIV ANTIBODY (ROUTINE TESTING W REFLEX): HIV Screen 4th Generation wRfx: NONREACTIVE

## 2020-08-25 LAB — ANTIBODY SCREEN: Antibody Screen: NEGATIVE

## 2020-08-25 LAB — RPR: RPR Ser Ql: NONREACTIVE

## 2020-08-27 ENCOUNTER — Encounter: Payer: Self-pay | Admitting: *Deleted

## 2020-09-21 ENCOUNTER — Other Ambulatory Visit: Payer: Self-pay

## 2020-09-21 ENCOUNTER — Encounter: Payer: Self-pay | Admitting: Women's Health

## 2020-09-21 ENCOUNTER — Ambulatory Visit (INDEPENDENT_AMBULATORY_CARE_PROVIDER_SITE_OTHER): Payer: Medicaid Other | Admitting: Women's Health

## 2020-09-21 VITALS — BP 120/76 | HR 82 | Wt 248.0 lb

## 2020-09-21 DIAGNOSIS — Z1389 Encounter for screening for other disorder: Secondary | ICD-10-CM | POA: Diagnosis not present

## 2020-09-21 DIAGNOSIS — Z6791 Unspecified blood type, Rh negative: Secondary | ICD-10-CM | POA: Diagnosis not present

## 2020-09-21 DIAGNOSIS — O26899 Other specified pregnancy related conditions, unspecified trimester: Secondary | ICD-10-CM

## 2020-09-21 DIAGNOSIS — Z3480 Encounter for supervision of other normal pregnancy, unspecified trimester: Secondary | ICD-10-CM | POA: Diagnosis not present

## 2020-09-21 DIAGNOSIS — O99283 Endocrine, nutritional and metabolic diseases complicating pregnancy, third trimester: Secondary | ICD-10-CM

## 2020-09-21 DIAGNOSIS — Z3483 Encounter for supervision of other normal pregnancy, third trimester: Secondary | ICD-10-CM

## 2020-09-21 DIAGNOSIS — O26893 Other specified pregnancy related conditions, third trimester: Secondary | ICD-10-CM | POA: Diagnosis not present

## 2020-09-21 DIAGNOSIS — Z331 Pregnant state, incidental: Secondary | ICD-10-CM

## 2020-09-21 DIAGNOSIS — Z3A30 30 weeks gestation of pregnancy: Secondary | ICD-10-CM | POA: Diagnosis not present

## 2020-09-21 DIAGNOSIS — Z86711 Personal history of pulmonary embolism: Secondary | ICD-10-CM

## 2020-09-21 LAB — POCT URINALYSIS DIPSTICK OB
Blood, UA: NEGATIVE
Glucose, UA: NEGATIVE
Ketones, UA: NEGATIVE
Leukocytes, UA: NEGATIVE
Nitrite, UA: NEGATIVE
POC,PROTEIN,UA: NEGATIVE

## 2020-09-21 NOTE — Patient Instructions (Signed)
Morgan Nielsen, I greatly value your feedback.  If you receive a survey following your visit with [place] today, we appreciate you taking the time to fill it out.  Thanks, [name], CNM, WHNP-BC  [facility] at [facility] ([address] [place], [place] [address]) Entrance C, located off of [address] valet parking   Go to [email or web address] to register for FREE online childbirth classes    Call the office [phone]) or go to [facility] if:  You begin to have strong, frequent contractions  Your water breaks.  Sometimes it is a big gush of fluid, sometimes it is just a trickle that keeps getting your panties wet or running down your legs  You have vaginal bleeding.  It is normal to have a small amount of spotting if your cervix was checked.   You don't feel your baby moving like normal.  If you don't, get you something to eat and drink and lay down and focus on feeling your baby move.  You should feel at least 10 movements in 2 hours.  If you don't, you should call the office or go to [facility].   Call the office [phone]) or go to [facility] hospital for these signs of pre-eclampsia:  Severe headache that does not go away with Tylenol  Visual changes- seeing spots, double, blurred vision  Pain under your right breast or upper abdomen that does not go away with Tums or heartburn medicine  Nausea and/or vomiting  Severe swelling in your hands, feet, and face    Home Blood Pressure Monitoring for Patients   Your provider has recommended that you check your blood pressure (BP) at least once a week at home. If you do not have a blood pressure cuff at home, one will be provided for you. Contact your provider if you have not received your monitor within 1 week.   Helpful Tips for Accurate Home Blood Pressure Checks  . Don't smoke, exercise, or drink caffeine 30 minutes before checking your BP . Use the restroom before checking your BP (a full  bladder can raise your pressure) . Relax in a comfortable upright chair . Feet on the ground . Left arm resting comfortably on a flat surface at the level of your heart . Legs uncrossed . Back supported . Sit quietly and don't talk . Place the cuff on your bare arm . Adjust snuggly, so that only two fingertips can fit between your skin and the top of the cuff . Check 2 readings separated by at least one minute . Keep a log of your BP readings . For a visual, please reference this diagram: http://ccnc.care/bpdiagram  Provider Name: [facility]     Phone: [phone]  Zone 1: ALL CLEAR  Continue to monitor your symptoms:  . BP reading is less than 140 (top number) or less than 90 (bottom number)  . No right upper stomach pain . No headaches or seeing spots . No feeling nauseated or throwing up . No swelling in face and hands  Zone 2: CAUTION Call your doctor's office for any of the following:  . BP reading is greater than 140 (top number) or greater than 90 (bottom number)  . Stomach pain under your ribs in the middle or right side . Headaches or seeing spots . Feeling nauseated or throwing up . Swelling in face and hands  Zone 3: EMERGENCY  Seek immediate medical care if you have any of  the following:  . BP reading is greater than160 (top number) or greater than 110 (bottom number) . Severe headaches not improving with Tylenol . Serious difficulty catching your breath . Any worsening symptoms from Zone 2  Preterm Labor and Birth Information  The normal length of a pregnancy is 39-41 weeks. Preterm labor is when labor starts before 37 completed weeks of pregnancy. What are the risk factors for preterm labor? Preterm labor is more likely to occur in women who:  Have certain infections during pregnancy such as a bladder infection, sexually transmitted infection, or infection inside the uterus (chorioamnionitis).  Have a shorter-than-normal cervix.  Have gone into  preterm labor before.  Have had surgery on their cervix.  Are younger than age 54 or older than age 25.  Are African American.  Are pregnant with twins or multiple babies (multiple gestation).  Take street drugs or smoke while pregnant.  Do not gain enough weight while pregnant.  Became pregnant shortly after having been pregnant. What are the symptoms of preterm labor? Symptoms of preterm labor include:  Cramps similar to those that can happen during a menstrual period. The cramps may happen with diarrhea.  Pain in the abdomen or lower back.  Regular uterine contractions that may feel like tightening of the abdomen.  A feeling of increased pressure in the pelvis.  Increased watery or bloody mucus discharge from the vagina.  Water breaking (ruptured amniotic sac). Why is it important to recognize signs of preterm labor? It is important to recognize signs of preterm labor because babies who are born prematurely may not be fully developed. This can put them at an increased risk for:  Long-term (chronic) heart and lung problems.  Difficulty immediately after birth with regulating body systems, including blood sugar, body temperature, heart rate, and breathing rate.  Bleeding in the brain.  Cerebral palsy.  Learning difficulties.  Death. These risks are highest for babies who are born before 48 weeks of pregnancy. How is preterm labor treated? Treatment depends on the length of your pregnancy, your condition, and the health of your baby. It may involve: 1. Having a stitch (suture) placed in your cervix to prevent your cervix from opening too early (cerclage). 2. Taking or being given medicines, such as: ? Hormone medicines. These may be given early in pregnancy to help support the pregnancy. ? Medicine to stop contractions. ? Medicines to help mature the baby's lungs. These may be prescribed if the risk of delivery is high. ? Medicines to prevent your baby from  developing cerebral palsy. If the labor happens before 34 weeks of pregnancy, you may need to stay in the hospital. What should I do if I think I am in preterm labor? If you think that you are going into preterm labor, call your health care provider right away. How can I prevent preterm labor in future pregnancies? To increase your chance of having a full-term pregnancy:  Do not use any tobacco products, such as cigarettes, chewing tobacco, and e-cigarettes. If you need help quitting, ask your health care provider.  Do not use street drugs or medicines that have not been prescribed to you during your pregnancy.  Talk with your health care provider before taking any herbal supplements, even if you have been taking them regularly.  Make sure you gain a healthy amount of weight during your pregnancy.  Watch for infection. If you think that you might have an infection, get it checked right away.  Make sure to  tell your health care provider if you have gone into preterm labor before. This information is not intended to replace advice given to you by your health care provider. Make sure you discuss any questions you have with your health care provider. Document Revised: 01/18/2019 Document Reviewed: 02/17/2016 Elsevier Patient Education  [place].

## 2020-09-21 NOTE — Progress Notes (Signed)
LOW-RISK PREGNANCY VISIT Patient name: Morgan Nielsen MRN [id number]  Date of birth: 04-12-1986 Chief Complaint:   Routine Prenatal Visit  History of Present Illness:   AIREL MAGADAN is a 34 y.o. G76P2002 female at [redacted]w[redacted]d with an Estimated Date of Delivery: 11/26/20 being seen today for ongoing management of a low-risk pregnancy.  Depression screen [facility] 2/9 08/24/2020 05/20/2020 06/16/2017 05/12/2017  Decreased Interest 0 1 0 0  Down, Depressed, Hopeless 0 0 0 0  PHQ - 2 Score 0 1 0 0  Altered sleeping 0 2 0 0  Tired, decreased energy 3 2 2 2   Change in appetite 0 1 1 1   Feeling bad or failure about yourself  0 0 0 0  Trouble concentrating 0 0 0 0  Moving slowly or fidgety/restless 0 0 0 0  Suicidal thoughts 0 0 0 0  PHQ-9 Score 3 6 3 3     Today she reports had contractions in [employer] the other day, went away. Contractions: Not present. Vag. Bleeding: None.  Movement: Present. denies leaking of fluid. Review of Systems:   Pertinent items are noted in HPI Denies abnormal vaginal discharge w/ itching/odor/irritation, headaches, visual changes, shortness of breath, chest pain, abdominal pain, severe nausea/vomiting, or problems with urination or bowel movements unless otherwise stated above. Pertinent History Reviewed:  Reviewed past medical,surgical, social, obstetrical and family history.  Reviewed problem list, medications and allergies. Physical Assessment:   Vitals:   09/21/20 1101  BP: 120/76  Pulse: 82  Weight: 248 lb (112.5 kg)  Body mass index is 36.62 kg/m.        Physical Examination:   General appearance: Well appearing, and in no distress  Mental status: Alert, oriented to person, place, and time  Skin: Warm & dry  Cardiovascular: Normal heart rate noted  Respiratory: Normal respiratory effort, no distress  Abdomen: Soft, gravid, nontender  Pelvic: Cervical exam deferred         Extremities: Edema: None  Fetal Status: Fetal Heart Rate (bpm): 150 Fundal Height:  32 cm Movement: Present    Chaperone: N/A   Results for orders placed or performed in visit on 09/21/20 (from the past 24 hour(s))  POC Urinalysis Dipstick OB   Collection Time: 09/21/20 11:11 AM  Result Value Ref Range   Color, UA     Clarity, UA     Glucose, UA Negative Negative   Bilirubin, UA     Ketones, UA neg    Spec Grav, UA     Blood, UA neg    pH, UA     POC,PROTEIN,UA Negative Negative, Trace, Small (1+), Moderate (2+), Large (3+), 4+   Urobilinogen, UA     Nitrite, UA neg    Leukocytes, UA Negative Negative   Appearance     Odor      Assessment & Plan:  1) Low-risk pregnancy G3P2002 at [redacted]w[redacted]d with an Estimated Date of Delivery: 11/26/20   2) H/O PE, on Lovenox  3) Prev c/s> then VBAC, wants TOLAC, sign consent w/ MD next visit  4) Hypothyroidism> on synthroid 09/23/20 daily, last TSH 2.120 on 10/12   Meds: No orders of the defined types were placed in this encounter.  Labs/procedures today: rhogam, wants Tdap next visit  Plan:  Continue routine obstetrical care  Next visit: prefers in person    Reviewed: Preterm labor symptoms and general obstetric precautions including but not limited to vaginal bleeding, contractions, leaking of fluid and fetal movement were reviewed in detail  with the patient.  All questions were answered. Has home bp cuff. Check bp weekly, let us know if >140/90.   Follow-up: Return in about 2 weeks (around 10/05/2020) for LROB, in person, MD only to sign VBAC consent.  No future appointments.  Orders Placed This Encounter  Procedures  . RHO (D) Immune Globulin  . POC Urinalysis Dipstick OB   Cheral Marker CNM, [facility] 09/21/2020 11:54 AM

## 2020-10-06 ENCOUNTER — Ambulatory Visit (INDEPENDENT_AMBULATORY_CARE_PROVIDER_SITE_OTHER): Payer: Medicaid Other | Admitting: Obstetrics & Gynecology

## 2020-10-06 ENCOUNTER — Encounter: Payer: Self-pay | Admitting: Obstetrics & Gynecology

## 2020-10-06 ENCOUNTER — Other Ambulatory Visit: Payer: Self-pay

## 2020-10-06 VITALS — BP 121/73 | HR 84 | Wt 252.5 lb

## 2020-10-06 DIAGNOSIS — Z1389 Encounter for screening for other disorder: Secondary | ICD-10-CM

## 2020-10-06 DIAGNOSIS — Z331 Pregnant state, incidental: Secondary | ICD-10-CM

## 2020-10-06 DIAGNOSIS — Z3480 Encounter for supervision of other normal pregnancy, unspecified trimester: Secondary | ICD-10-CM

## 2020-10-06 DIAGNOSIS — Z3A32 32 weeks gestation of pregnancy: Secondary | ICD-10-CM

## 2020-10-06 LAB — POCT URINALYSIS DIPSTICK OB
Glucose, UA: NEGATIVE
Ketones, UA: NEGATIVE
Nitrite, UA: NEGATIVE
POC,PROTEIN,UA: NEGATIVE

## 2020-10-06 NOTE — Progress Notes (Signed)
Patient ID: Morgan Nielsen, female   DOB: 1986/03/24, 34 y.o.   MRN: [id number]   LOW-RISK PREGNANCY VISIT Patient name: Morgan Nielsen MRN [id number]  Date of birth: [birth year] Chief Complaint:   Routine Prenatal Visit  History of Present Illness:   Morgan Nielsen is a 34 y.o. G88P2002 female at [redacted]w[redacted]d with an Estimated Date of Delivery: 11/26/20 being seen today for ongoing management of a low-risk pregnancy.  Depression screen [facility] 2/9 08/24/2020 05/20/2020 06/16/2017 05/12/2017  Decreased Interest 0 1 0 0  Down, Depressed, Hopeless 0 0 0 0  PHQ - 2 Score 0 1 0 0  Altered sleeping 0 2 0 0  Tired, decreased energy 3 2 2 2   Change in appetite 0 1 1 1   Feeling bad or failure about yourself  0 0 0 0  Trouble concentrating 0 0 0 0  Moving slowly or fidgety/restless 0 0 0 0  Suicidal thoughts 0 0 0 0  PHQ-9 Score 3 6 3 3     Today she reports no complaints. Contractions: Not present. Vag. Bleeding: None.  Movement: Present. denies leaking of fluid. Review of Systems:   Pertinent items are noted in HPI Denies abnormal vaginal discharge w/ itching/odor/irritation, headaches, visual changes, shortness of breath, chest pain, abdominal pain, severe nausea/vomiting, or problems with urination or bowel movements unless otherwise stated above. Pertinent History Reviewed:  Reviewed past medical,surgical, social, obstetrical and family history.  Reviewed problem list, medications and allergies. Physical Assessment:   Vitals:   10/06/20 0906  BP: 121/73  Pulse: 84  Weight: 252 lb 8 oz (114.5 kg)  Body mass index is 37.29 kg/m.        Physical Examination:   General appearance: Well appearing, and in no distress  Mental status: Alert, oriented to person, place, and time  Skin: Warm & dry  Cardiovascular: Normal heart rate noted  Respiratory: Normal respiratory effort, no distress  Abdomen: Soft, gravid, nontender  Pelvic: Cervical exam deferred         Extremities: Edema: None  Fetal  Status: Fetal Heart Rate (bpm): 141 Fundal Height: 34 cm Movement: Present    Chaperone: n/a    Results for orders placed or performed in visit on 10/06/20 (from the past 24 hour(s))  POC Urinalysis Dipstick OB   Collection Time: 10/06/20  9:19 AM  Result Value Ref Range   Color, UA     Clarity, UA     Glucose, UA Negative Negative   Bilirubin, UA     Ketones, UA neg    Spec Grav, UA     Blood, UA trace    pH, UA     POC,PROTEIN,UA Negative Negative, Trace, Small (1+), Moderate (2+), Large (3+), 4+   Urobilinogen, UA     Nitrite, UA neg    Leukocytes, UA Moderate (2+) (A) Negative   Appearance     Odor      Assessment & Plan:  1) Low-risk pregnancy G3P2002 at [redacted]w[redacted]d with an Estimated Date of Delivery: 11/26/20   2) Hx of PE on prophylactic dosing of lovenox 40 mg,   3) JW declines all blood products   Meds: No orders of the defined types were placed in this encounter.  Labs/procedures today:   Plan:  Continue routine obstetrical care  Next visit: prefers in person    Reviewed: Preterm labor symptoms and general obstetric precautions including but not limited to vaginal bleeding, contractions, leaking of fluid and fetal movement were reviewed in detail  with the patient.  All questions were answered. Has home bp cuff. Rx faxed to . Check bp weekly, let us know if >140/90.   Follow-up: Return in about 3 weeks (around 10/27/2020) for LROB.  Orders Placed This Encounter  Procedures  . POC Urinalysis Dipstick OB    Lazaro Arms, MD 10/06/2020 9:38 AM

## 2020-10-10 NOTE — L&D Delivery Note (Signed)
OB/GYN Faculty Practice Delivery Note  Morgan Nielsen is a 35 y.o. E3P2951 s/p VBAC at [redacted]w[redacted]d. She was admitted for active labor. Presented to the office for evaluation today and patient was found to be fully dilated. Arrived to [facility] via EMS from [facility]    ROM: 0h 68m with clear fluid GBS Status: positive Maximum Maternal Temperature: 97.7  Labor Progress: . Patient was found to be fully dilated at the office. EMS was called and the CNM in the office rode down to [facility] with the patient. She arrived here coping well with contractions. She got one dose of ampicillin for GBS prophylaxis.   Delivery Date/Time: 12/01/2020 @ 1417  Delivery: Called to room and patient was complete and pushing. Head delivered LOA. No nuchal cord present. Shoulder and body delivered in usual fashion. Infant with spontaneous cry, placed on mother's abdomen, dried and stimulated. Cord clamped x 2 after 1-minute delay, and cut by FOB. Cord blood drawn. Placenta delivered spontaneously with gentle cord traction. Fundus firm with massage and Pitocin. Labia, perineum, vagina, and cervix inspected inspected with no lacerations.   Placenta: spontaneous, complete/intact  Complications: None Lacerations:  None EBL: 100 cc Analgesia: None   Postpartum Planning [x]  message to sent to schedule follow-up  [ ]  vaccines needs covid vaccine   Infant: Female  APGARs 8/9  weight pending  DNP, CNM  12/01/20  2:54 PM

## 2020-10-11 ENCOUNTER — Inpatient Hospital Stay (HOSPITAL_COMMUNITY)
Admission: AD | Admit: 2020-10-11 | Discharge: 2020-10-12 | Disposition: A | Discharge status: Home / Self Care | Payer: Medicaid Other | Attending: Obstetrics and Gynecology | Admitting: Obstetrics and Gynecology

## 2020-10-11 ENCOUNTER — Other Ambulatory Visit: Payer: Self-pay

## 2020-10-11 DIAGNOSIS — Z3A33 33 weeks gestation of pregnancy: Secondary | ICD-10-CM | POA: Insufficient documentation

## 2020-10-11 DIAGNOSIS — Z79890 Hormone replacement therapy (postmenopausal): Secondary | ICD-10-CM | POA: Insufficient documentation

## 2020-10-11 DIAGNOSIS — Z7901 Long term (current) use of anticoagulants: Secondary | ICD-10-CM | POA: Insufficient documentation

## 2020-10-11 DIAGNOSIS — Z3689 Encounter for other specified antenatal screening: Secondary | ICD-10-CM

## 2020-10-11 DIAGNOSIS — Z7982 Long term (current) use of aspirin: Secondary | ICD-10-CM | POA: Insufficient documentation

## 2020-10-11 DIAGNOSIS — Z9884 Bariatric surgery status: Secondary | ICD-10-CM | POA: Insufficient documentation

## 2020-10-11 DIAGNOSIS — O368130 Decreased fetal movements, third trimester, not applicable or unspecified: Secondary | ICD-10-CM

## 2020-10-11 DIAGNOSIS — Z79899 Other long term (current) drug therapy: Secondary | ICD-10-CM | POA: Insufficient documentation

## 2020-10-11 DIAGNOSIS — Z86711 Personal history of pulmonary embolism: Secondary | ICD-10-CM | POA: Insufficient documentation

## 2020-10-11 NOTE — MAU Provider Note (Signed)
History     CSN: [id number]  Arrival date and time: 10/11/20 2210   Event Date/Time   First Provider Initiated Contact with Patient 10/11/20 2304      Chief Complaint  Patient presents with  . Decreased Fetal Movement   34 y.o. G3P2002 @33 .3 wks presenting with decreased FM. She was doing [facility] and felt a few big movements then they stopped altogether. She tried poking her abdomen but felt no more movements. Denies VB, LOF, or regular ctx. She is eating and drinking well.  OB History    Gravida  3   Para  2   Term  2   Preterm  0   AB  0   Living  2     SAB  0   IAB  0   Ectopic  0   Multiple  0   Live Births  2           Past Medical History:  Diagnosis Date  . Allergy   . Anemia   . Asthma   . Clotting disorder (HCC)    per bloodwork not clotting disorder  . Family history of blood clots 09/13/2011  . Family history of blood disorder 09/13/2011  . Headache(784.0)    migraines  . Hypertension   . Hypothyroidism 09/13/2011  . Obese 09/13/2011  . Pregnancy induced hypertension   . Pulmonary embolism (HCC) 05/09/2011  . Religious or spiritual beliefs affecting medical care 09/13/2011   doesnt accept blood    Past Surgical History:  Procedure Laterality Date  . BARIATRIC SURGERY  2016  . CESAREAN SECTION N/A 12/07/2013   Procedure: CESAREAN SECTION;  Surgeon: 12/09/2013, MD;  Location: [facility];  Service: Obstetrics;  Laterality: N/A;  . WISDOM TOOTH EXTRACTION    . WRIST SURGERY     left torn cartilage 2007    Family History  Problem Relation Age of Onset  . Hyperlipidemia Mother   . Hypertension Mother   . Pulmonary embolism Mother   . Diabetes Father   . Hyperlipidemia Father   . Hypertension Father   . Cancer Maternal Grandfather        prostate  . Pulmonary embolism Maternal Aunt   . Deep vein thrombosis Maternal Uncle   . Cancer Paternal Aunt        uterine  . Pulmonary embolism Maternal Aunt   . Pulmonary embolism Maternal Aunt    . Congestive Heart Failure Maternal Grandmother   . Alcohol abuse Paternal Grandfather     Social History   Tobacco Use  . Smoking status: Never Smoker  . Smokeless tobacco: Never Used  Vaping Use  . Vaping Use: Never used  Substance Use Topics  . Alcohol use: No    Alcohol/week: 1.0 - 2.0 standard drink    Types: 1 - 2 Standard drinks or equivalent per week  . Drug use: No    Allergies:  Allergies  Allergen Reactions  . Shellfish Allergy Swelling    Medications Prior to Admission  Medication Sig Dispense Refill Last Dose  . aspirin 81 MG chewable tablet Chew 2 tablets (162 mg total) by mouth daily. 60 tablet 6 10/11/2020 at Unknown time  . enoxaparin (LOVENOX) 40 MG/0.4ML injection Inject 0.4 mLs (40 mg total) into the skin daily. 12 mL 9 10/11/2020 at Unknown time  . Ferrous Fumarate-Folic Acid 324-1 MG TABS Take 324 mg by mouth daily. 30 tablet 8 10/11/2020 at Unknown time  . levothyroxine (SYNTHROID) 150 MCG tablet  Take 1 tablet (150 mcg total) by mouth daily before breakfast. 30 tablet 6 10/11/2020 at Unknown time  . Prenatal Vit-Fe Fumarate-FA (MULTIVITAMIN-PRENATAL) 27-0.8 MG TABS tablet Take 1 tablet by mouth daily at 12 noon.   10/11/2020 at Unknown time  . Doxylamine-Pyridoxine (DICLEGIS) 10-10 MG TBEC 2 tabs q hs, if sx persist add 1 tab q am on day 3, if sx persist add 1 tab q afternoon on day 4 (Patient not taking: No sig reported) 100 tablet 6     Review of Systems  Gastrointestinal: Negative for abdominal pain.  Genitourinary: Negative for vaginal bleeding.   Physical Exam   Blood pressure 134/74, pulse 84, temperature 97.8 F (36.6 C), temperature source Oral, resp. rate 16, height 5\' 9"  (1.753 m), weight 116.3 kg, last menstrual period 02/20/2020, SpO2 100 %, currently breastfeeding.  Physical Exam Vitals and nursing note reviewed.  Constitutional:      Appearance: Normal appearance.  HENT:     Head: Normocephalic.  Cardiovascular:     Rate and Rhythm:  Normal rate.  Pulmonary:     Effort: Pulmonary effort is normal. No respiratory distress.  Abdominal:     Palpations: Abdomen is soft.     Tenderness: There is no abdominal tenderness.  Musculoskeletal:        General: Normal range of motion.     Cervical back: Normal range of motion.  Skin:    General: Skin is warm and dry.  Neurological:     General: No focal deficit present.     Mental Status: She is alert and oriented to person, place, and time.  Psychiatric:        Mood and Affect: Mood normal.        Behavior: Behavior normal.   EFM: 130 bpm, mod variability, + accels, no decels Toco: none  No results found for this or any previous visit (from the past 24 hour(s)).  MAU Course  Procedures  MDM Since EFM applied pt is feeling good FM. NST is reactive. Pt marked >10 FMs during NST. Pt reassured. Stable for discharge home.   Assessment and Plan   1. [redacted] weeks gestation of pregnancy   2. NST (non-stress test) reactive    Discharge home Follow up at [facility] as scheduled [facility]  Allergies as of 10/12/2020      Reactions   Shellfish Allergy Swelling      Medication List    STOP taking these medications   Doxylamine-Pyridoxine 10-10 MG Tbec Commonly known as: Diclegis     TAKE these medications   aspirin 81 MG chewable tablet Chew 2 tablets (162 mg total) by mouth daily.   enoxaparin 40 MG/0.4ML injection Commonly known as: Lovenox Inject 0.4 mLs (40 mg total) into the skin daily.   Ferrous Fumarate-Folic Acid 324-1 MG Tabs Take 324 mg by mouth daily.   levothyroxine 150 MCG tablet Commonly known as: SYNTHROID Take 1 tablet (150 mcg total) by mouth daily before breakfast.   multivitamin-prenatal 27-0.8 MG Tabs tablet Take 1 tablet by mouth daily at 12 noon.      12/10/2020, CNM 10/12/2020, 12:33 AM

## 2020-10-11 NOTE — MAU Note (Signed)
..  Morgan Nielsen is a 35 y.o. at [redacted]w[redacted]d here in MAU reporting: decreased fetal movement. Patient reports the last time she felt baby move was around 9 pm. Denies contractions, LOF, or vaginal bleeding.  Pain score: 0/10 Vitals:   10/11/20 2231  BP: 134/74  Pulse: 84  Resp: 16  Temp: 97.8 F (36.6 C)  SpO2: 100%     FHT: monitors applied 154

## 2020-10-12 DIAGNOSIS — Z7982 Long term (current) use of aspirin: Secondary | ICD-10-CM | POA: Diagnosis not present

## 2020-10-12 DIAGNOSIS — Z7901 Long term (current) use of anticoagulants: Secondary | ICD-10-CM | POA: Diagnosis not present

## 2020-10-12 DIAGNOSIS — Z79899 Other long term (current) drug therapy: Secondary | ICD-10-CM | POA: Diagnosis not present

## 2020-10-12 DIAGNOSIS — O368130 Decreased fetal movements, third trimester, not applicable or unspecified: Secondary | ICD-10-CM | POA: Diagnosis present

## 2020-10-12 DIAGNOSIS — Z86711 Personal history of pulmonary embolism: Secondary | ICD-10-CM | POA: Diagnosis not present

## 2020-10-12 DIAGNOSIS — Z3A33 33 weeks gestation of pregnancy: Secondary | ICD-10-CM | POA: Diagnosis not present

## 2020-10-12 DIAGNOSIS — Z9884 Bariatric surgery status: Secondary | ICD-10-CM | POA: Diagnosis not present

## 2020-10-12 DIAGNOSIS — Z79890 Hormone replacement therapy (postmenopausal): Secondary | ICD-10-CM | POA: Diagnosis not present

## 2020-10-12 NOTE — Discharge Instructions (Signed)
Fetal Movement Counts Patient Name: ________________________________________________ Patient Due Date: ____________________ What is a fetal movement count?  A fetal movement count is the number of times that you feel your baby move during a certain amount of time. This may also be called a fetal kick count. A fetal movement count is recommended for every pregnant woman. You may be asked to start counting fetal movements as early as week 28 of your pregnancy. Pay attention to when your baby is most active. You may notice your baby's sleep and wake cycles. You may also notice things that make your baby move more. You should do a fetal movement count:  When your baby is normally most active.  At the same time each day. A good time to count movements is while you are resting, after having something to eat and drink. How do I count fetal movements? 1. Find a quiet, comfortable area. Sit, or lie down on your side. 2. Write down the date, the start time and stop time, and the number of movements that you felt between those two times. Take this information with you to your health care visits. 3. Write down your start time when you feel the first movement. 4. Count kicks, flutters, swishes, rolls, and jabs. You should feel at least 10 movements. 5. You may stop counting after you have felt 10 movements, or if you have been counting for 2 hours. Write down the stop time. 6. If you do not feel 10 movements in 2 hours, contact your health care provider for further instructions. Your health care provider may want to do additional tests to assess your baby's well-being. Contact a health care provider if:  You feel fewer than 10 movements in 2 hours.  Your baby is not moving like he or she usually does. Date: ____________ Start time: ____________ Stop time: ____________ Movements: ____________ Date: ____________ Start time: ____________ Stop time: ____________ Movements: ____________ Date: ____________  Start time: ____________ Stop time: ____________ Movements: ____________ Date: ____________ Start time: ____________ Stop time: ____________ Movements: ____________ Date: ____________ Start time: ____________ Stop time: ____________ Movements: ____________ Date: ____________ Start time: ____________ Stop time: ____________ Movements: ____________ Date: ____________ Start time: ____________ Stop time: ____________ Movements: ____________ Date: ____________ Start time: ____________ Stop time: ____________ Movements: ____________ Date: ____________ Start time: ____________ Stop time: ____________ Movements: ____________ This information is not intended to replace advice given to you by your health care provider. Make sure you discuss any questions you have with your health care provider. Document Revised: 05/16/2019 Document Reviewed: 05/16/2019 Elsevier Patient Education  2020 [employer]vier Inc.  

## 2020-10-20 ENCOUNTER — Encounter: Payer: Self-pay | Admitting: *Deleted

## 2020-10-26 ENCOUNTER — Encounter: Payer: Self-pay | Admitting: *Deleted

## 2020-10-27 ENCOUNTER — Encounter: Payer: Self-pay | Admitting: Family Medicine

## 2020-10-27 ENCOUNTER — Telehealth (INDEPENDENT_AMBULATORY_CARE_PROVIDER_SITE_OTHER): Payer: Medicaid Other | Admitting: Family Medicine

## 2020-10-27 VITALS — BP 132/74

## 2020-10-27 DIAGNOSIS — O99013 Anemia complicating pregnancy, third trimester: Secondary | ICD-10-CM | POA: Diagnosis not present

## 2020-10-27 DIAGNOSIS — Z9884 Bariatric surgery status: Secondary | ICD-10-CM

## 2020-10-27 DIAGNOSIS — Z8759 Personal history of other complications of pregnancy, childbirth and the puerperium: Secondary | ICD-10-CM

## 2020-10-27 DIAGNOSIS — O34219 Maternal care for unspecified type scar from previous cesarean delivery: Secondary | ICD-10-CM | POA: Diagnosis not present

## 2020-10-27 DIAGNOSIS — O99283 Endocrine, nutritional and metabolic diseases complicating pregnancy, third trimester: Secondary | ICD-10-CM | POA: Diagnosis not present

## 2020-10-27 DIAGNOSIS — E039 Hypothyroidism, unspecified: Secondary | ICD-10-CM

## 2020-10-27 DIAGNOSIS — O26899 Other specified pregnancy related conditions, unspecified trimester: Secondary | ICD-10-CM

## 2020-10-27 DIAGNOSIS — Z3480 Encounter for supervision of other normal pregnancy, unspecified trimester: Secondary | ICD-10-CM

## 2020-10-27 DIAGNOSIS — Z6791 Unspecified blood type, Rh negative: Secondary | ICD-10-CM

## 2020-10-27 DIAGNOSIS — D649 Anemia, unspecified: Secondary | ICD-10-CM

## 2020-10-27 DIAGNOSIS — O99213 Obesity complicating pregnancy, third trimester: Secondary | ICD-10-CM | POA: Diagnosis not present

## 2020-10-27 DIAGNOSIS — Z98891 History of uterine scar from previous surgery: Secondary | ICD-10-CM

## 2020-10-27 DIAGNOSIS — O26893 Other specified pregnancy related conditions, third trimester: Secondary | ICD-10-CM

## 2020-10-27 DIAGNOSIS — E669 Obesity, unspecified: Secondary | ICD-10-CM

## 2020-10-27 NOTE — Progress Notes (Signed)
I connected with Morgan Nielsen 10/28/20 at 11:30 AM EST by: MyChart video and verified that I am speaking with the correct person using two identifiers.  Patient is located at home and provider is located at [facility].     The purpose of this virtual visit is to provide medical care while limiting exposure to the novel coronavirus. I discussed the limitations, risks, security and privacy concerns of performing an evaluation and management service by MyChart video and the availability of in person appointments. I also discussed with the patient that there may be a patient responsible charge related to this service. By engaging in this virtual visit, you consent to the provision of healthcare.  Additionally, you authorize for your insurance to be billed for the services provided during this visit.  The patient expressed understanding and agreed to proceed.  The following staff members participated in the virtual visit:  Morgan Nielsen      PRENATAL VISIT NOTE  Subjective:  Morgan Nielsen is a 35 y.o. G3P2002 at [redacted]w[redacted]d  for phone visit for ongoing prenatal care.  She is currently monitored for the following issues for this high-risk pregnancy and has Pulmonary embolism (HCC); Hypothyroidism; Obese; Family history of blood clots; Family history of blood disorder; History of gestational hypertension; Rh negative state in antepartum period; Depression affecting pregnancy; History of bariatric surgery; Patient is Jehovah's Witness; Encounter for supervision of normal pregnancy, antepartum; History of vaginal delivery following previous cesarean delivery; and Anemia affecting pregnancy on their problem list.  Patient reports no complaints.  Contractions: Irritability. Vag. Bleeding: None.  Movement: Present. Denies leaking of fluid.   The following portions of the patient's history were reviewed and updated as appropriate: allergies, current medications, past family history, past medical history, past  social history, past surgical history and problem list.   Objective:   Vitals:   10/27/20 1132  BP: 132/74   Self-Obtained  Fetal Status:     Movement: Present     Assessment and Plan:  Pregnancy: G3P2002 at [redacted]w[redacted]d  1. Supervision of other normal pregnancy, antepartum Asked about water birth Encouraged to talk to midwife at next visit Given website for water birth class  2. H/o PE - taking lovenox  3. H/o cs - desires TOLAC, has had successful VBAC In past  Preterm labor symptoms and general obstetric precautions including but not limited to vaginal bleeding, contractions, leaking of fluid and fetal movement were reviewed in detail with the patient.  Return in about 1 week (around 11/03/2020) for Routine prenatal care, MD or APP, 36wks.  Future Appointments  Date Time Provider Department Center  11/10/2020  9:50 AM Cheral Marker, CNM [facility]    Time spent on virtual visit: 15 minutes  Federico Flake, MD

## 2020-11-10 ENCOUNTER — Other Ambulatory Visit (HOSPITAL_COMMUNITY)
Admission: RE | Admit: 2020-11-10 | Discharge: 2020-11-10 | Disposition: A | Discharge status: Home / Self Care | Payer: Medicaid Other | Source: Ambulatory Visit | Attending: Obstetrics & Gynecology | Admitting: Obstetrics & Gynecology

## 2020-11-10 ENCOUNTER — Encounter: Payer: Self-pay | Admitting: Women's Health

## 2020-11-10 ENCOUNTER — Other Ambulatory Visit: Payer: Self-pay

## 2020-11-10 ENCOUNTER — Ambulatory Visit (INDEPENDENT_AMBULATORY_CARE_PROVIDER_SITE_OTHER): Payer: Medicaid Other | Admitting: Women's Health

## 2020-11-10 VITALS — BP 128/68 | HR 82 | Wt 255.6 lb

## 2020-11-10 DIAGNOSIS — E039 Hypothyroidism, unspecified: Secondary | ICD-10-CM | POA: Insufficient documentation

## 2020-11-10 DIAGNOSIS — Z3A37 37 weeks gestation of pregnancy: Secondary | ICD-10-CM | POA: Diagnosis present

## 2020-11-10 DIAGNOSIS — O99283 Endocrine, nutritional and metabolic diseases complicating pregnancy, third trimester: Secondary | ICD-10-CM | POA: Diagnosis present

## 2020-11-10 DIAGNOSIS — Z23 Encounter for immunization: Secondary | ICD-10-CM | POA: Diagnosis not present

## 2020-11-10 DIAGNOSIS — Z3480 Encounter for supervision of other normal pregnancy, unspecified trimester: Secondary | ICD-10-CM

## 2020-11-10 DIAGNOSIS — Z98891 History of uterine scar from previous surgery: Secondary | ICD-10-CM

## 2020-11-10 DIAGNOSIS — Z862 Personal history of diseases of the blood and blood-forming organs and certain disorders involving the immune mechanism: Secondary | ICD-10-CM

## 2020-11-10 DIAGNOSIS — Z3483 Encounter for supervision of other normal pregnancy, third trimester: Secondary | ICD-10-CM

## 2020-11-10 NOTE — Progress Notes (Addendum)
LOW-RISK PREGNANCY VISIT Patient name: Morgan Nielsen MRN [id number]  Date of birth: [birth year] Chief Complaint:   Routine Prenatal Visit  History of Present Illness:   Morgan Nielsen is a 35 y.o. G49P2002 female at [redacted]w[redacted]d with an Estimated Date of Delivery: 11/26/20 being seen today for ongoing management of a low-risk pregnancy.  Depression screen [facility] 2/9 08/24/2020 05/20/2020 06/16/2017 05/12/2017  Decreased Interest 0 1 0 0  Down, Depressed, Hopeless 0 0 0 0  PHQ - 2 Score 0 1 0 0  Altered sleeping 0 2 0 0  Tired, decreased energy 3 2 2 2   Change in appetite 0 1 1 1   Feeling bad or failure about yourself  0 0 0 0  Trouble concentrating 0 0 0 0  Moving slowly or fidgety/restless 0 0 0 0  Suicidal thoughts 0 0 0 0  PHQ-9 Score 3 6 3 3     Today she reports no complaints. Contractions: Irritability. Vag. Bleeding: None.  Movement: Present. denies leaking of fluid. Review of Systems:   Pertinent items are noted in HPI Denies abnormal vaginal discharge w/ itching/odor/irritation, headaches, visual changes, shortness of breath, chest pain, abdominal pain, severe nausea/vomiting, or problems with urination or bowel movements unless otherwise stated above. Pertinent History Reviewed:  Reviewed past medical,surgical, social, obstetrical and family history.  Reviewed problem list, medications and allergies. Physical Assessment:   Vitals:   11/10/20 1019  BP: 128/68  Pulse: 82  Weight: 255 lb 9.6 oz (115.9 kg)  Body mass index is 37.75 kg/m.        Physical Examination:   General appearance: Well appearing, and in no distress  Mental status: Alert, oriented to person, place, and time  Skin: Warm & dry  Cardiovascular: Normal heart rate noted  Respiratory: Normal respiratory effort, no distress  Abdomen: Soft, gravid, nontender  Pelvic: Cervical exam performed  Dilation: 3 Effacement (%): Thick Station: Ballotable  Extremities: Edema: None  Fetal Status: Fetal Heart Rate (bpm):  148 Fundal Height: 37 cm Movement: Present Presentation: Vertex  Chaperone: Angel Neas   No results found for this or any previous visit (from the past 24 hour(s)).  Assessment & Plan:  1) Low-risk pregnancy G3P2002 at [redacted]w[redacted]d with an Estimated Date of Delivery: 11/26/20   2) Prev c/s then VBAC, wants TOLAC, consent signed 12/28 w/ LHE  3) H/O PE> on Lovenox  4) Hypothyroidism> on synthroid [redacted]w[redacted]d daily, recheck TSH  5) H/O GHTN> bp stable  6) H/O anemia> 10.5 earlier in pregnancy, was 12.1 @ 26wks, will recheck today. Is Jehovah's Witness, will not accept blood products, but is ok w/ IV Fe and actually did this prior to last delivery as hgb was low   Meds: No orders of the defined types were placed in this encounter.  Labs/procedures today: gbs, gc/ct, sve, tdap  Plan:  Continue routine obstetrical care  Next visit: prefers online    Reviewed: Term labor symptoms and general obstetric precautions including but not limited to vaginal bleeding, contractions, leaking of fluid and fetal movement were reviewed in detail with the patient.  All questions were answered. Has home bp cuff. Check bp weekly, let 11/28/20 know if >140/90.   Follow-up: Return in about 1 week (around 11/17/2020) for LROB, CNM, MyChart Video.  No future appointments.  Orders Placed This Encounter  Procedures  . Culture, beta strep (group b only)  . Tdap vaccine greater than or equal to 7yo IM  . TSH  . CBC  Cheral Marker CNM, [place] 11/10/2020 10:52 AM

## 2020-11-10 NOTE — Patient Instructions (Signed)
Morgan Nielsen, I greatly value your feedback.  If you receive a survey following your visit with [place] today, we appreciate you taking the time to fill it out.  Thanks, [name], CNM, WHNP-BC  [facility] ([address] [place], [place] [address]) Entrance C, located off of [address] [facility] Free 24/7 valet parking   [facility]: [address] [place], [place] [address]  ~1.4 mi  Go to [email or web address] to register for FREE online childbirth classes    Call the office [phone]) or go to [facility] if:  You begin to have strong, frequent contractions  Your water breaks.  Sometimes it is a big gush of fluid, sometimes it is just a trickle that keeps getting your panties wet or running down your legs  You have vaginal bleeding.  It is normal to have a small amount of spotting if your cervix was checked.   You don't feel your baby moving like normal.  If you don't, get you something to eat and drink and lay down and focus on feeling your baby move.  You should feel at least 10 movements in 2 hours.  If you don't, you should call the office or go to [facility].   Call the office [phone]) or go to [facility] [place] hospital for these signs of pre-eclampsia:  Severe headache that does not go away with Tylenol  Visual changes- seeing spots, double, blurred vision  Pain under your right breast or upper abdomen that does not go away with Tums or heartburn medicine  Nausea and/or vomiting  Severe swelling in your hands, feet, and face    Home Blood Pressure Monitoring for Patients   Your provider has recommended that you check your blood pressure (BP) at least once a week at home. If you do not have a blood pressure cuff at home, one will be provided for you. Contact your provider if you have not received your monitor within 1 week.   Helpful Tips for Accurate Home Blood Pressure Checks  . Don't smoke, exercise, or drink caffeine 30 minutes before  checking your BP . Use the restroom before checking your BP (a full bladder can raise your pressure) . Relax in a comfortable upright chair . Feet on the ground . Left arm resting comfortably on a flat surface at the level of your heart . Legs uncrossed . Back supported . Sit quietly and don't talk . Place the cuff on your bare arm . Adjust snuggly, so that only two fingertips can fit between your skin and the top of the cuff . Check 2 readings separated by at least one minute . Keep a log of your BP readings . For a visual, please reference this diagram: http://ccnc.care/bpdiagram  Provider Name: [facility]     Phone: [phone]  Zone 1: ALL CLEAR  Continue to monitor your symptoms:  . BP reading is less than 140 (top number) or less than 90 (bottom number)  . No right upper stomach pain . No headaches or seeing spots . No feeling nauseated or throwing up . No swelling in face and hands  Zone 2: CAUTION Call your doctor's office for any of the following:  . BP reading is greater than 140 (top number) or greater than 90 (bottom number)  . Stomach pain under your ribs in the middle or right side . Headaches or seeing spots . Feeling nauseated or throwing up . Swelling in face and hands  Zone 3: EMERGENCY  Seek immediate medical care if you have any of the following:  . BP reading is greater than160 (top number) or greater than 110 (bottom number) . Severe headaches not improving with Tylenol . Serious difficulty catching your breath . Any worsening symptoms from Zone 2   Braxton Hicks Contractions Contractions of the uterus can occur throughout pregnancy, but they are not always a sign that you are in labor. You may have practice contractions called Braxton Hicks contractions. These false labor contractions are sometimes confused with true labor. What are Deberah Pelton contractions? Braxton Hicks contractions are tightening movements that occur in the muscles of the  uterus before labor. Unlike true labor contractions, these contractions do not result in opening (dilation) and thinning of the cervix. Toward the end of pregnancy (32-34 weeks), Braxton Hicks contractions can happen more often and may become stronger. These contractions are sometimes difficult to tell apart from true labor because they can be very uncomfortable. You should not feel embarrassed if you go to the hospital with false labor. Sometimes, the only way to tell if you are in true labor is for your health care provider to look for changes in the cervix. The health care provider will do a physical exam and may monitor your contractions. If you are not in true labor, the exam should show that your cervix is not dilating and your water has not broken. If there are no other health problems associated with your pregnancy, it is completely safe for you to be sent home with false labor. You may continue to have Braxton Hicks contractions until you go into true labor. How to tell the difference between true labor and false labor True labor  Contractions last 30-70 seconds.  Contractions become very regular.  Discomfort is usually felt in the top of the uterus, and it spreads to the lower abdomen and low back.  Contractions do not go away with walking.  Contractions usually become more intense and increase in frequency.  The cervix dilates and gets thinner. False labor  Contractions are usually shorter and not as strong as true labor contractions.  Contractions are usually irregular.  Contractions are often felt in the front of the lower abdomen and in the groin.  Contractions may go away when you walk around or change positions while lying down.  Contractions get weaker and are shorter-lasting as time goes on.  The cervix usually does not dilate or become thin. Follow these instructions at home:  1. Take over-the-counter and prescription medicines only as told by your health care  provider. 2. Keep up with your usual exercises and follow other instructions from your health care provider. 3. Eat and drink lightly if you think you are going into labor. 4. If Braxton Hicks contractions are making you uncomfortable: ? Change your position from lying down or resting to walking, or change from walking to resting. ? Sit and rest in a tub of warm water. ? Drink enough fluid to keep your urine pale yellow. Dehydration may cause these contractions. ? Do slow and deep breathing several times an hour. 5. Keep all follow-up prenatal visits as told by your health care provider. This is important. Contact a health care provider if:  You have a fever.  You have continuous pain in your abdomen. Get help right away if:  Your contractions become stronger, more regular, and closer together.  You have fluid leaking or gushing from your vagina.  You pass blood-tinged mucus (bloody  show).  You have bleeding from your vagina.  You have low back pain that you never had before.  You feel your baby's head pushing down and causing pelvic pressure.  Your baby is not moving inside you as much as it used to. Summary  Contractions that occur before labor are called Braxton Hicks contractions, false labor, or practice contractions.  Braxton Hicks contractions are usually shorter, weaker, farther apart, and less regular than true labor contractions. True labor contractions usually become progressively stronger and regular, and they become more frequent.  Manage discomfort from [facility] contractions by changing position, resting in a warm bath, drinking plenty of water, or practicing deep breathing. This information is not intended to replace advice given to you by your health care provider. Make sure you discuss any questions you have with your health care provider. Document Revised: 09/08/2017 Document Reviewed: 02/09/2017 Elsevier Patient Education  2020 [employer].

## 2020-11-11 LAB — CBC
Hematocrit: 34.2 % (ref 34.0–46.6)
Hemoglobin: 11.2 g/dL (ref 11.1–15.9)
MCH: 26.9 pg (ref 26.6–33.0)
MCHC: 32.7 g/dL (ref 31.5–35.7)
MCV: 82 fL (ref 79–97)
Platelets: 218 10*3/uL (ref 150–450)
RBC: 4.17 x10E6/uL (ref 3.77–5.28)
RDW: 13.9 % (ref 11.7–15.4)
WBC: 11.1 10*3/uL — ABNORMAL HIGH (ref 3.4–10.8)

## 2020-11-11 LAB — CERVICOVAGINAL ANCILLARY ONLY
Chlamydia: NEGATIVE
Comment: NEGATIVE
Comment: NORMAL
Neisseria Gonorrhea: NEGATIVE

## 2020-11-11 LAB — TSH: TSH: 1.93 u[IU]/mL (ref 0.450–4.500)

## 2020-11-13 LAB — CULTURE, BETA STREP (GROUP B ONLY): Strep Gp B Culture: POSITIVE — AB

## 2020-11-17 ENCOUNTER — Other Ambulatory Visit: Payer: Self-pay

## 2020-11-17 ENCOUNTER — Telehealth (INDEPENDENT_AMBULATORY_CARE_PROVIDER_SITE_OTHER): Payer: Medicaid Other | Admitting: Obstetrics & Gynecology

## 2020-11-17 ENCOUNTER — Encounter: Payer: Self-pay | Admitting: Obstetrics & Gynecology

## 2020-11-17 VITALS — BP 118/70 | Wt 258.2 lb

## 2020-11-17 DIAGNOSIS — Z3480 Encounter for supervision of other normal pregnancy, unspecified trimester: Secondary | ICD-10-CM

## 2020-11-17 DIAGNOSIS — O34219 Maternal care for unspecified type scar from previous cesarean delivery: Secondary | ICD-10-CM | POA: Diagnosis not present

## 2020-11-17 DIAGNOSIS — Z3A38 38 weeks gestation of pregnancy: Secondary | ICD-10-CM

## 2020-11-17 DIAGNOSIS — Z98891 History of uterine scar from previous surgery: Secondary | ICD-10-CM

## 2020-11-17 DIAGNOSIS — Z86711 Personal history of pulmonary embolism: Secondary | ICD-10-CM

## 2020-11-17 NOTE — Progress Notes (Addendum)
    Telephone visit  I am in my office and patient is at her home  LOW-RISK PREGNANCY VISIT Patient name: Morgan Nielsen MRN [id number]  Date of birth: 07-18-1986 Chief Complaint:   Routine Prenatal Visit  History of Present Illness:   Morgan Nielsen is a 35 y.o. G53P2002 female at [redacted]w[redacted]d with an Estimated Date of Delivery: 11/26/20 being seen today for ongoing management of a low-risk pregnancy.  Depression screen [facility] 2/9 08/24/2020 05/20/2020 06/16/2017 05/12/2017  Decreased Interest 0 1 0 0  Down, Depressed, Hopeless 0 0 0 0  PHQ - 2 Score 0 1 0 0  Altered sleeping 0 2 0 0  Tired, decreased energy 3 2 2 2   Change in appetite 0 1 1 1   Feeling bad or failure about yourself  0 0 0 0  Trouble concentrating 0 0 0 0  Moving slowly or fidgety/restless 0 0 0 0  Suicidal thoughts 0 0 0 0  PHQ-9 Score 3 6 3 3     Today she reports no complaints. Contractions: Irregular. Vag. Bleeding: None.  Movement: Present. denies leaking of fluid. Review of Systems:   Pertinent items are noted in HPI Denies abnormal vaginal discharge w/ itching/odor/irritation, headaches, visual changes, shortness of breath, chest pain, abdominal pain, severe nausea/vomiting, or problems with urination or bowel movements unless otherwise stated above. Pertinent History Reviewed:  Reviewed past medical,surgical, social, obstetrical and family history.  Reviewed problem list, medications and allergies. Physical Assessment:   Vitals:   11/17/20 1127  Weight: 258 lb 3.2 oz (117.1 kg)  Body mass index is 38.13 kg/m.        Physical Examination:   General appearance: Well appearing, and in no distress  Mental status: Alert, oriented to person, place, and time  Skin: Warm & dry  Cardiovascular: Normal heart rate noted  Respiratory: Normal respiratory effort, no distress  Abdomen: Soft, gravid, nontender  Pelvic: Cervical exam deferred         Extremities: Edema: None  Fetal Status:     Movement: Present     Chaperone: n/a    No results found for this or any previous visit (from the past 24 hour(s)).  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  Labs/procedures today:   Plan:  Continue routine obstetrical care  Next visit: prefers in person    Reviewed: Term labor symptoms and general obstetric precautions including but not limited to vaginal bleeding, contractions, leaking of fluid and fetal movement were reviewed in detail with the patient.  All questions were answered. Has home bp cuff. Rx faxed to . Check bp weekly, let 01/15/21 know if >140/90.   Follow-up: Return in about 1 week (around 11/24/2020) for LROB.  No orders of the defined types were placed in this encounter.     Face to face time:  15 minutes  Greater than 50% of the visit time was spent in counseling and coordination of care with the patient.  The summary and outline of the counseling and care coordination is summarized in the note above.   All questions were answered.  11/28/20, MD 11/17/2020 11:49 AM

## 2020-11-24 ENCOUNTER — Ambulatory Visit (INDEPENDENT_AMBULATORY_CARE_PROVIDER_SITE_OTHER): Payer: Medicaid Other | Admitting: Obstetrics & Gynecology

## 2020-11-24 ENCOUNTER — Encounter: Payer: Self-pay | Admitting: Obstetrics & Gynecology

## 2020-11-24 ENCOUNTER — Other Ambulatory Visit: Payer: Self-pay

## 2020-11-24 VITALS — BP 129/71 | HR 83 | Wt 262.0 lb

## 2020-11-24 DIAGNOSIS — Z98891 History of uterine scar from previous surgery: Secondary | ICD-10-CM

## 2020-11-24 DIAGNOSIS — Z3480 Encounter for supervision of other normal pregnancy, unspecified trimester: Secondary | ICD-10-CM

## 2020-11-24 NOTE — Progress Notes (Signed)
   LOW-RISK PREGNANCY VISIT Patient name: Morgan Nielsen MRN [id number]  Date of birth: 05/29/1986 Chief Complaint:   Routine Prenatal Visit  History of Present Illness:   Morgan Nielsen is a 35 y.o. G17P2002 female at [redacted]w[redacted]d with an Estimated Date of Delivery: 11/26/20 being seen today for ongoing management of a low-risk pregnancy.  Depression screen [facility] 2/9 08/24/2020 05/20/2020 06/16/2017 05/12/2017  Decreased Interest 0 1 0 0  Down, Depressed, Hopeless 0 0 0 0  PHQ - 2 Score 0 1 0 0  Altered sleeping 0 2 0 0  Tired, decreased energy 3 2 2 2   Change in appetite 0 1 1 1   Feeling bad or failure about yourself  0 0 0 0  Trouble concentrating 0 0 0 0  Moving slowly or fidgety/restless 0 0 0 0  Suicidal thoughts 0 0 0 0  PHQ-9 Score 3 6 3 3     Today she reports no complaints. Contractions: Irregular. Vag. Bleeding: None.  Movement: Present. denies leaking of fluid. Review of Systems:   Pertinent items are noted in HPI Denies abnormal vaginal discharge w/ itching/odor/irritation, headaches, visual changes, shortness of breath, chest pain, abdominal pain, severe nausea/vomiting, or problems with urination or bowel movements unless otherwise stated above. Pertinent History Reviewed:  Reviewed past medical,surgical, social, obstetrical and family history.  Reviewed problem list, medications and allergies. Physical Assessment:   Vitals:   11/24/20 1014  BP: 129/71  Pulse: 83  Weight: 262 lb (118.8 kg)  Body mass index is 38.69 kg/m.        Physical Examination:   General appearance: Well appearing, and in no distress  Mental status: Alert, oriented to person, place, and time  Skin: Warm & dry  Cardiovascular: Normal heart rate noted  Respiratory: Normal respiratory effort, no distress  Abdomen: Soft, gravid, nontender  Pelvic: Cervical exam performed       2/th/-3  Extremities: Edema: None  Fetal Status:     Movement: Present    Chaperone: Amanda Rash    No results found  for this or any previous visit (from the past 24 hour(s)).  Assessment & Plan:  1) Low-risk pregnancy G3P2002 at [redacted]w[redacted]d with an Estimated Date of Delivery: 11/26/20   2) Previous C section, successful VBAC, desires TOLAC, signed up for water birth, membranes swept today  3)  Hx of PE on lovenox 40   Meds: No orders of the defined types were placed in this encounter.  Labs/procedures today:   Plan:  Continue routine obstetrical care  Next visit: prefers in person    Reviewed: Term labor symptoms and general obstetric precautions including but not limited to vaginal bleeding, contractions, leaking of fluid and fetal movement were reviewed in detail with the patient.  All questions were answered. Has home bp cuff. Rx faxed to  Check bp weekly, let 11/26/20 know if >140/90.   Follow-up: Return in about 6 days (around 11/30/2020) for NST, LROB.  No orders of the defined types were placed in this encounter.   11/28/20, MD 11/24/2020 11:04 AM

## 2020-12-01 ENCOUNTER — Encounter (HOSPITAL_COMMUNITY): Payer: Self-pay | Admitting: Obstetrics and Gynecology

## 2020-12-01 ENCOUNTER — Telehealth: Payer: Self-pay | Admitting: *Deleted

## 2020-12-01 ENCOUNTER — Encounter: Payer: Self-pay | Admitting: Women's Health

## 2020-12-01 ENCOUNTER — Other Ambulatory Visit: Payer: Self-pay

## 2020-12-01 ENCOUNTER — Ambulatory Visit (INDEPENDENT_AMBULATORY_CARE_PROVIDER_SITE_OTHER): Payer: Medicaid Other | Admitting: Women's Health

## 2020-12-01 ENCOUNTER — Inpatient Hospital Stay (HOSPITAL_COMMUNITY)
Admission: AD | Admit: 2020-12-01 | Discharge: 2020-12-03 | DRG: 807 | Disposition: A | Discharge status: Home / Self Care | Payer: Medicaid Other | Attending: Family Medicine | Admitting: Family Medicine

## 2020-12-01 VITALS — BP 121/71 | HR 81 | Wt 259.5 lb

## 2020-12-01 DIAGNOSIS — O99844 Bariatric surgery status complicating childbirth: Secondary | ICD-10-CM | POA: Diagnosis present

## 2020-12-01 DIAGNOSIS — O480 Post-term pregnancy: Secondary | ICD-10-CM

## 2020-12-01 DIAGNOSIS — E039 Hypothyroidism, unspecified: Secondary | ICD-10-CM | POA: Diagnosis present

## 2020-12-01 DIAGNOSIS — Z20822 Contact with and (suspected) exposure to covid-19: Secondary | ICD-10-CM | POA: Diagnosis present

## 2020-12-01 DIAGNOSIS — Z3A40 40 weeks gestation of pregnancy: Secondary | ICD-10-CM

## 2020-12-01 DIAGNOSIS — D649 Anemia, unspecified: Secondary | ICD-10-CM | POA: Diagnosis present

## 2020-12-01 DIAGNOSIS — Z86711 Personal history of pulmonary embolism: Secondary | ICD-10-CM

## 2020-12-01 DIAGNOSIS — O34219 Maternal care for unspecified type scar from previous cesarean delivery: Secondary | ICD-10-CM | POA: Diagnosis present

## 2020-12-01 DIAGNOSIS — Z789 Other specified health status: Secondary | ICD-10-CM | POA: Diagnosis present

## 2020-12-01 DIAGNOSIS — O26893 Other specified pregnancy related conditions, third trimester: Secondary | ICD-10-CM | POA: Diagnosis present

## 2020-12-01 DIAGNOSIS — O9902 Anemia complicating childbirth: Secondary | ICD-10-CM | POA: Diagnosis present

## 2020-12-01 DIAGNOSIS — IMO0001 Reserved for inherently not codable concepts without codable children: Secondary | ICD-10-CM | POA: Diagnosis present

## 2020-12-01 DIAGNOSIS — O99013 Anemia complicating pregnancy, third trimester: Secondary | ICD-10-CM

## 2020-12-01 DIAGNOSIS — I2699 Other pulmonary embolism without acute cor pulmonale: Secondary | ICD-10-CM | POA: Diagnosis present

## 2020-12-01 DIAGNOSIS — O26899 Other specified pregnancy related conditions, unspecified trimester: Secondary | ICD-10-CM

## 2020-12-01 DIAGNOSIS — Z6791 Unspecified blood type, Rh negative: Secondary | ICD-10-CM | POA: Diagnosis not present

## 2020-12-01 DIAGNOSIS — O99824 Streptococcus B carrier state complicating childbirth: Secondary | ICD-10-CM | POA: Diagnosis present

## 2020-12-01 DIAGNOSIS — O99019 Anemia complicating pregnancy, unspecified trimester: Secondary | ICD-10-CM | POA: Diagnosis present

## 2020-12-01 DIAGNOSIS — Z7901 Long term (current) use of anticoagulants: Secondary | ICD-10-CM | POA: Diagnosis not present

## 2020-12-01 DIAGNOSIS — O99284 Endocrine, nutritional and metabolic diseases complicating childbirth: Secondary | ICD-10-CM | POA: Diagnosis present

## 2020-12-01 DIAGNOSIS — Z98891 History of uterine scar from previous surgery: Secondary | ICD-10-CM

## 2020-12-01 DIAGNOSIS — Z3483 Encounter for supervision of other normal pregnancy, third trimester: Secondary | ICD-10-CM | POA: Diagnosis not present

## 2020-12-01 DIAGNOSIS — Z3480 Encounter for supervision of other normal pregnancy, unspecified trimester: Secondary | ICD-10-CM

## 2020-12-01 LAB — ANTIBODY SCREEN: Antibody Screen: NEGATIVE

## 2020-12-01 LAB — CBC
HCT: 31 % — ABNORMAL LOW (ref 36.0–46.0)
Hemoglobin: 10.2 g/dL — ABNORMAL LOW (ref 12.0–15.0)
MCH: 27.3 pg (ref 26.0–34.0)
MCHC: 32.9 g/dL (ref 30.0–36.0)
MCV: 83.1 fL (ref 80.0–100.0)
Platelets: 204 10*3/uL (ref 150–400)
RBC: 3.73 MIL/uL — ABNORMAL LOW (ref 3.87–5.11)
RDW: 15.8 % — ABNORMAL HIGH (ref 11.5–15.5)
WBC: 13.9 10*3/uL — ABNORMAL HIGH (ref 4.0–10.5)
nRBC: 0 % (ref 0.0–0.2)

## 2020-12-01 LAB — RESP PANEL BY RT-PCR (FLU A&B, COVID) ARPGX2
Influenza A by PCR: NEGATIVE
Influenza B by PCR: NEGATIVE
SARS Coronavirus 2 by RT PCR: NEGATIVE

## 2020-12-01 LAB — ABO/RH: ABO/RH(D): B NEG

## 2020-12-01 MED ORDER — ONDANSETRON HCL 4 MG PO TABS: 4.0000 mg | ORAL_TABLET | ORAL | Status: DC | PRN

## 2020-12-01 MED ORDER — SOD CITRATE-CITRIC ACID 500-334 MG/5ML PO SOLN: 30.0000 mL | ORAL | Status: DC | PRN

## 2020-12-01 MED ORDER — OXYCODONE HCL 5 MG PO TABS: 5.0000 mg | ORAL_TABLET | ORAL | Status: DC | PRN

## 2020-12-01 MED ORDER — WITCH HAZEL-GLYCERIN EX PADS: 1.0000 "application " | MEDICATED_PAD | CUTANEOUS | Status: DC | PRN

## 2020-12-01 MED ORDER — OXYCODONE-ACETAMINOPHEN 5-325 MG PO TABS: 2.0000 | ORAL_TABLET | ORAL | Status: DC | PRN

## 2020-12-01 MED ORDER — DIPHENHYDRAMINE HCL 25 MG PO CAPS: 25.0000 mg | ORAL_CAPSULE | Freq: Four times a day (QID) | ORAL | Status: DC | PRN

## 2020-12-01 MED ORDER — TETANUS-DIPHTH-ACELL PERTUSSIS 5-2.5-18.5 LF-MCG/0.5 IM SUSY: 0.5000 mL | PREFILLED_SYRINGE | Freq: Once | INTRAMUSCULAR | Status: DC

## 2020-12-01 MED ORDER — OXYTOCIN-SODIUM CHLORIDE 30-0.9 UT/500ML-% IV SOLN
INTRAVENOUS | Status: AC
  Filled 2020-12-01: qty 500

## 2020-12-01 MED ORDER — COCONUT OIL OIL
1.0000 "application " | TOPICAL_OIL | Status: DC | PRN
  Administered 2020-12-02: 1 via TOPICAL

## 2020-12-01 MED ORDER — ACETAMINOPHEN 325 MG PO TABS: 650.0000 mg | ORAL_TABLET | ORAL | Status: DC | PRN

## 2020-12-01 MED ORDER — ENOXAPARIN SODIUM 60 MG/0.6ML ~~LOC~~ SOLN
0.5000 mg/kg | SUBCUTANEOUS | Status: DC
  Filled 2020-12-01: qty 0.6

## 2020-12-01 MED ORDER — SIMETHICONE 80 MG PO CHEW: 80.0000 mg | CHEWABLE_TABLET | ORAL | Status: DC | PRN

## 2020-12-01 MED ORDER — ZOLPIDEM TARTRATE 5 MG PO TABS: 5.0000 mg | ORAL_TABLET | Freq: Every evening | ORAL | Status: DC | PRN

## 2020-12-01 MED ORDER — PRENATAL MULTIVITAMIN CH
1.0000 | ORAL_TABLET | Freq: Every day | ORAL | Status: DC
  Administered 2020-12-02 – 2020-12-03 (×2): 1 via ORAL
  Filled 2020-12-01 (×2): qty 1

## 2020-12-01 MED ORDER — SENNOSIDES-DOCUSATE SODIUM 8.6-50 MG PO TABS
2.0000 | ORAL_TABLET | ORAL | Status: DC
  Administered 2020-12-01 – 2020-12-02 (×2): 2 via ORAL
  Filled 2020-12-01 (×2): qty 2

## 2020-12-01 MED ORDER — ENOXAPARIN SODIUM 40 MG/0.4ML ~~LOC~~ SOLN: 40.0000 mg | SUBCUTANEOUS | Status: DC

## 2020-12-01 MED ORDER — OXYCODONE HCL 5 MG PO TABS: 10.0000 mg | ORAL_TABLET | ORAL | Status: DC | PRN

## 2020-12-01 MED ORDER — LACTATED RINGERS IV SOLN: INTRAVENOUS | Status: DC

## 2020-12-01 MED ORDER — ONDANSETRON HCL 4 MG/2ML IJ SOLN: 4.0000 mg | Freq: Four times a day (QID) | INTRAMUSCULAR | Status: DC | PRN

## 2020-12-01 MED ORDER — DIBUCAINE (PERIANAL) 1 % EX OINT: 1.0000 "application " | TOPICAL_OINTMENT | CUTANEOUS | Status: DC | PRN

## 2020-12-01 MED ORDER — LEVOTHYROXINE SODIUM 75 MCG PO TABS
150.0000 ug | ORAL_TABLET | Freq: Every day | ORAL | Status: DC
  Administered 2020-12-02 – 2020-12-03 (×2): 150 ug via ORAL
  Filled 2020-12-01 (×2): qty 2

## 2020-12-01 MED ORDER — LIDOCAINE HCL (PF) 1 % IJ SOLN
30.0000 mL | INTRAMUSCULAR | Status: DC | PRN
Start: 2020-12-01 — End: 2020-12-01

## 2020-12-01 MED ORDER — LACTATED RINGERS IV SOLN: 500.0000 mL | INTRAVENOUS | Status: DC | PRN

## 2020-12-01 MED ORDER — RHO D IMMUNE GLOBULIN 1500 UNIT/2ML IJ SOSY
300.0000 ug | PREFILLED_SYRINGE | Freq: Once | INTRAMUSCULAR | Status: AC
  Administered 2020-12-01: 300 ug via INTRAVENOUS
  Filled 2020-12-01: qty 2

## 2020-12-01 MED ORDER — SODIUM CHLORIDE 0.9 % IV SOLN
2.0000 g | Freq: Four times a day (QID) | INTRAVENOUS | Status: AC
  Administered 2020-12-01: 2 g via INTRAVENOUS
  Filled 2020-12-01: qty 2000

## 2020-12-01 MED ORDER — BENZOCAINE-MENTHOL 20-0.5 % EX AERO: 1.0000 "application " | INHALATION_SPRAY | CUTANEOUS | Status: DC | PRN

## 2020-12-01 MED ORDER — ONDANSETRON HCL 4 MG/2ML IJ SOLN: 4.0000 mg | INTRAMUSCULAR | Status: DC | PRN

## 2020-12-01 MED ORDER — OXYCODONE-ACETAMINOPHEN 5-325 MG PO TABS: 1.0000 | ORAL_TABLET | ORAL | Status: DC | PRN

## 2020-12-01 MED ORDER — OXYTOCIN-SODIUM CHLORIDE 30-0.9 UT/500ML-% IV SOLN: 2.5000 [IU]/h | INTRAVENOUS | Status: DC

## 2020-12-01 MED ORDER — OXYTOCIN BOLUS FROM INFUSION: 333.0000 mL | Freq: Once | INTRAVENOUS | Status: DC

## 2020-12-01 MED ORDER — IBUPROFEN 600 MG PO TABS
600.0000 mg | ORAL_TABLET | Freq: Four times a day (QID) | ORAL | Status: DC
  Administered 2020-12-01 – 2020-12-03 (×8): 600 mg via ORAL
  Filled 2020-12-01 (×9): qty 1

## 2020-12-01 NOTE — Telephone Encounter (Signed)
Pt is having contractions every 5.5 minutes. Started around 3 am. Pt has fluid leaking every time she has a contraction. "It smells like pool water". I spoke with Tish, RN. Pt was advised to come in the office now. Pt is 30 minutes away but will come in. JSY

## 2020-12-01 NOTE — Lactation Note (Signed)
This note was copied from a baby's chart. Lactation Consultation Note  Patient Name: Girl Eddy Termine RWERX'V Date: 12/01/2020   Age:34 mins  LC arrived to see mother and staff nurse reports that mother has infant latched and she ask mother if she wanted Lactation and and mother reported no not at this time.    Maternal Data    Feeding    LATCH Score                    Lactation Tools Discussed/Used    Interventions    Discharge    Consult Status      Michel Bickers 12/01/2020, 2:55 PM

## 2020-12-01 NOTE — Discharge Summary (Signed)
Postpartum Discharge Summary      Patient Name: Morgan Nielsen DOB: October 17, 1985 MRN: [id number]  Date of admission: 12/01/2020 Delivery date:12/01/2020  Delivering provider: Marcille Buffy D  Date of discharge: 12/03/2020  Admitting diagnosis: Normal labor [O80, Z37.9] Intrauterine pregnancy: [redacted]w[redacted]d    Secondary diagnosis:  Active Problems:   Pulmonary embolism (HCanones   Hypothyroidism   Rh negative state in antepartum period   Patient is Jehovah's Witness   History of vaginal delivery following previous cesarean delivery   Anemia affecting pregnancy   Normal labor  Additional problems: none    Discharge diagnosis: Term Pregnancy Delivered and VBAC                                              Post partum procedures:rhogam Augmentation: N/A Complications: None  Hospital course: Onset of Labor With Vaginal Delivery      35y.o. yo G3P2002 at 462w5das admitted in Active Labor on 12/01/2020. Patient had an uncomplicated labor course as follows:  Membrane Rupture Time/Date: 2:15 PM ,12/01/2020   Delivery Method:VBAC, Spontaneous  Episiotomy: None  Lacerations:  None  Patient had an uncomplicated postpartum course.  She is ambulating, tolerating a regular diet, passing flatus, and urinating well. She resumed her Lovenox on PPD#1 at a dose of 6012maily x 6wks. Patient is discharged home in stable condition on 12/03/20.  Newborn Data: Birth date:12/01/2020  Birth time:2:17 PM  Gender:Female  Living status:Living  Apgars:8 ,9  Weight:3856 g (8lb 8oz)  Magnesium Sulfate received: No BMZ received: Yes Rhophylac:Yes MMR:No T-DaP:Given prenatally Flu: No Transfusion:No  Physical exam  Vitals:   12/02/20 0525 12/02/20 1348 12/02/20 2156 12/03/20 0541  BP: (!) 104/52 (!) 110/58 120/67 119/73  Pulse: 66 75 75 81  Resp: _0 Temp: 97.7 F (36.5 C) 98 F (36.7 C) 99 F (37.2 C) 98.6 F (37 C)  TempSrc: Oral Oral Oral Oral  SpO2: 100%  100% 100%   General:  alert and cooperative Lochia: appropriate Uterine Fundus: firm Incision: N/A DVT Evaluation: No evidence of DVT seen on physical exam. Labs: Lab Results  Component Value Date   WBC 13.9 (H) 12/01/2020   HGB 10.2 (L) 12/01/2020   HCT 31.0 (L) 12/01/2020   MCV 83.1 12/01/2020   PLT 204 12/01/2020   CMP Latest Ref Rng & Units 05/20/2020  Glucose 65 - 99 mg/dL 74  BUN 6 - 20 mg/dL 12  Creatinine 0.57 - 1.00 mg/dL 0.70  Sodium 134 - 144 mmol/L 136  Potassium 3.5 - 5.2 mmol/L 3.9  Chloride 96 - 106 mmol/L 102  CO2 20 - 29 mmol/L 21  Calcium 8.7 - 10.2 mg/dL 9.7  Total Protein 6.0 - 8.5 g/dL 7.2  Total Bilirubin 0.0 - 1.2 mg/dL <0.2  Alkaline Phos 48 - 121 IU/L 79  AST 0 - 40 IU/L 15  ALT 0 - 32 IU/L 15   Edinburgh Score: Edinburgh Postnatal Depression Scale Screening Tool 12/01/2020  I have been able to laugh and see the funny side of things. 0  I have looked forward with enjoyment to things. 0  I have blamed myself unnecessarily when things went wrong. 1  I have been anxious or worried for no good reason. 2  I have felt scared or panicky for no good reason. 1  Things have been  getting on top of me. 1  I have been so unhappy that I have had difficulty sleeping. 0  I have felt sad or miserable. 1  I have been so unhappy that I have been crying. 0  The thought of harming myself has occurred to me. 0  Edinburgh Postnatal Depression Scale Total 6     After visit meds:  Allergies as of 12/03/2020      Reactions   Shellfish Allergy Swelling      Medication List    STOP taking these medications   acetaminophen 500 MG tablet Commonly known as: TYLENOL   aspirin 81 MG chewable tablet   calcium carbonate 500 MG chewable tablet Commonly known as: TUMS - dosed in mg elemental calcium   omeprazole 20 MG capsule Commonly known as: PRILOSEC     TAKE these medications   albuterol 108 (90 Base) MCG/ACT inhaler Commonly known as: VENTOLIN HFA Inhale 2 puffs into the lungs  every 6 (six) hours as needed for wheezing or shortness of breath.   enoxaparin 60 MG/0.6ML injection Commonly known as: LOVENOX Inject 0.6 mLs (60 mg total) into the skin daily. For 6 weeks Start taking on: December 04, 2020 What changed:   medication strength  how much to take  additional instructions   Ferrous Fumarate-Folic Acid 221-7 MG Tabs Take 324 mg by mouth daily.   ibuprofen 600 MG tablet Commonly known as: ADVIL Take 1 tablet (600 mg total) by mouth every 6 (six) hours as needed.   levothyroxine 150 MCG tablet Commonly known as: SYNTHROID Take 1 tablet (150 mcg total) by mouth daily before breakfast.   prenatal multivitamin Tabs tablet Take 1 tablet by mouth at bedtime.        Discharge home in stable condition Infant Feeding: Breast Infant Disposition:home with mother Discharge instruction: per After Visit Summary and Postpartum booklet. Activity: Advance as tolerated. Pelvic rest for 6 weeks.  Diet: routine diet Future Appointments: Future Appointments  Date Time Provider [place]  01/05/2021  1:50 PM Roma Schanz, CNM [facility]   Follow up Visit:   Please schedule this patient for a In person postpartum visit in 4 weeks with the following provider: Any provider. Additional Postpartum F/U:None   High risk pregnancy complicated by: Bleeding disorder on Lovenox Delivery mode:  VBAC, Spontaneous  Anticipated Birth Control:  Vasectomy   Myrtis Ser [facility] 12/03/2020 8:56 AM

## 2020-12-01 NOTE — H&P (Addendum)
OBSTETRIC ADMISSION HISTORY AND PHYSICAL  Morgan Nielsen is a 35 y.o. female G3P2002 with IUP at [redacted]w[redacted]d by LMP c/w 8 wk [place] presenting for labor. She reports +FM, no VB, no blurry vision, headaches or peripheral edema, and RUQ pain. Patient presented to clinic this morning and was found to 10cm dilated. She came to L&D via EMS with Morgan Nielsen CNM accompanying.     She plans on breast feeding. She plans a vasectomy for birth control. She received her prenatal care at [facility]   Dating: By LMP --->  Estimated Date of Delivery: 11/26/20  Sono:    @[redacted]w[redacted]d , CWD, normal anatomy, cephalic presentation, 475g, 63% EFW   Prenatal History/Complications:  -History of one prior CS. Has had successful VBAC.  -history of PE, has been on lovenox -History of hypothyroidism, on synthroid 150 mcg daily  -Jehovah's witness, will not accept blood products but per chart review is okay with IV iron   Past Medical History: Past Medical History:  Diagnosis Date  . Allergy   . Anemia   . Asthma   . Clotting disorder (HCC)    per bloodwork not clotting disorder  . Family history of blood clots 09/13/2011  . Family history of blood disorder 09/13/2011  . Headache(784.0)    migraines  . Hypertension   . Hypothyroidism 09/13/2011  . Obese 09/13/2011  . Pregnancy induced hypertension   . Pulmonary embolism (HCC) 05/09/2011  . Religious or spiritual beliefs affecting medical care 09/13/2011   doesnt accept blood    Past Surgical History: Past Surgical History:  Procedure Laterality Date  . BARIATRIC SURGERY  2016  . CESAREAN SECTION N/A 12/07/2013   Procedure: CESAREAN SECTION;  Surgeon: 12/09/2013, MD;  Location: [facility];  Service: Obstetrics;  Laterality: N/A;  . WISDOM TOOTH EXTRACTION    . WRIST SURGERY     left torn cartilage 2007    Obstetrical History: OB History    Gravida  3   Para  2   Term  2   Preterm  0   AB  0   Living  2     SAB  0   IAB  0   Ectopic  0    Multiple  0   Live Births  2           Social History Social History   Socioeconomic History  . Marital status: Married    Spouse name: Not on file  . Number of children: Not on file  . Years of education: Not on file  . Highest education level: Not on file  Occupational History  . Not on file  Tobacco Use  . Smoking status: Never Smoker  . Smokeless tobacco: Never Used  Vaping Use  . Vaping Use: Never used  Substance and Sexual Activity  . Alcohol use: No    Alcohol/week: 1.0 - 2.0 standard drink    Types: 1 - 2 Standard drinks or equivalent per week  . Drug use: No  . Sexual activity: Yes    Partners: Male    Birth control/protection: None  Other Topics Concern  . Not on file  Social History Narrative  . Not on file   Social Determinants of Health   Financial Resource Strain: Low Risk   . Difficulty of Paying Living Expenses: Not hard at all  Food Insecurity: No Food Insecurity  . Worried About 2008 in the Last Year: Never true  . Ran Out  of Food in the Last Year: Never true  Transportation Needs: No Transportation Needs  . Lack of Transportation (Medical): No  . Lack of Transportation (Non-Medical): No  Physical Activity: Insufficiently Active  . Days of Exercise per Week: 3 days  . Minutes of Exercise per Session: 20 min  Stress: No Stress Concern Present  . Feeling of Stress : Only a little  Social Connections: Socially Integrated  . Frequency of Communication with Friends and Family: More than three times a week  . Frequency of Social Gatherings with Friends and Family: Twice a week  . Attends Religious Services: More than 4 times per year  . Active Member of Clubs or Organizations: Yes  . Attends Banker Meetings: More than 4 times per year  . Marital Status: Married    Family History: Family History  Problem Relation Age of Onset  . Hyperlipidemia Mother   . Hypertension Mother   . Pulmonary embolism Mother   .  Diabetes Father   . Hyperlipidemia Father   . Hypertension Father   . Cancer Maternal Grandfather        prostate  . Pulmonary embolism Maternal Aunt   . Deep vein thrombosis Maternal Uncle   . Cancer Paternal Aunt        uterine  . Pulmonary embolism Maternal Aunt   . Pulmonary embolism Maternal Aunt   . Congestive Heart Failure Maternal Grandmother   . Alcohol abuse Paternal Grandfather     Allergies: Allergies  Allergen Reactions  . Shellfish Allergy Swelling    No medications prior to admission.     Review of Systems   All systems reviewed and negative except as stated in HPI  Last menstrual period 02/20/2020, currently breastfeeding. General appearance: alert, cooperative and appears stated age Lungs: clear to auscultation bilaterally Heart: regular rate and rhythm Abdomen: soft, non-tender; bowel sounds normal Extremities: Homans sign is negative, no sign of DVT Presentation: cephalic Fetal monitoring baseline 135, mod var, pos accels, no decel Uterine activity q3-4 min      Prenatal labs: ABO, Rh: B/Negative/-- (08/11 1152) Antibody: Negative (11/15 0837) Rubella: 5.04 (08/11 1152) RPR: Non Reactive (11/15 0837)  HBsAg: Negative (08/11 1152)  HIV: Non Reactive (11/15 0837)  GBS: Positive/-- (02/01 1741)  2 hr Glucola normal Genetic screening  Declined  Anatomy US normal   Prenatal Transfer Tool  Maternal Diabetes: No Genetic Screening: Declined Maternal Ultrasounds/Referrals: Normal Fetal Ultrasounds or other Referrals:  None Maternal Substance Abuse:  No Significant Maternal Medications:  Meds include: Other: Synthroid, Lovenox Significant Maternal Lab Results: Group B Strep positive  No results found for this or any previous visit (from the past 24 hour(s)).  Patient Active Problem List   Diagnosis Date Noted  . Anemia affecting pregnancy 05/22/2020  . Encounter for supervision of normal pregnancy, antepartum 05/20/2020  . History of  vaginal delivery following previous cesarean delivery 05/20/2020  . Patient is Jehovah's Witness 10/26/2017  . History of bariatric surgery 06/23/2017  . Depression affecting pregnancy 06/16/2017  . Rh negative state in antepartum period 05/16/2017  . History of gestational hypertension 12/07/2013  . Hypothyroidism 09/13/2011  . Obese 09/13/2011  . Family history of blood clots 09/13/2011  . Family history of blood disorder 09/13/2011  . Pulmonary embolism (HCC) 05/09/2011    Assessment/Plan:  Morgan Nielsen is a 35 y.o. G3P2002 at [redacted]w[redacted]d here for labor. Patient arrives via EMS in labor.    #Labor: expectant mgmt  #Jehovah's witness: will not  accept blood products #Hx of PE: has been on therapeutic lovenox daily during pregnancy, likely will require six weeks ppx dosing postpartum   #TOLAC: Has had successful vbac in past. Consent form signed. Rediscussed risks/benefits w patient.   #FWB: Cat I  #ID:  GBS pos, pt complete but intact. Will give amp  #MOF: breast #MOC:vasectomy   Gita Kudo, MD  12/01/2020, 11:44 AM

## 2020-12-01 NOTE — Progress Notes (Signed)
   LOW-RISK PREGNANCY VISIT Patient name: Morgan Nielsen MRN [id number]  Date of birth: 1986-01-18 Chief Complaint:   Routine Prenatal Visit (Pt is having contractions every 5.5 minutes; leaking fluid every time she has a contraction)  History of Present Illness:   Morgan Nielsen is a 35 y.o. G53P2002 female at [redacted]w[redacted]d with an Estimated Date of Delivery: 11/26/20 being seen today for ongoing management of a low-risk pregnancy.  Depression screen [facility] 2/9 08/24/2020 05/20/2020 06/16/2017 05/12/2017  Decreased Interest 0 1 0 0  Down, Depressed, Hopeless 0 0 0 0  PHQ - 2 Score 0 1 0 0  Altered sleeping 0 2 0 0  Tired, decreased energy 3 2 2 2   Change in appetite 0 1 1 1   Feeling bad or failure about yourself  0 0 0 0  Trouble concentrating 0 0 0 0  Moving slowly or fidgety/restless 0 0 0 0  Suicidal thoughts 0 0 0 0  PHQ-9 Score 3 6 3 3     Today she reports Contractions since yesterday, getting closer and stronger, now q 5-3mins. Leaking fluid w/ uc's since 0300- clear.  Contractions: Regular. Vag. Bleeding: Scant.  Movement: Present. reports leaking of fluid. Review of Systems:   Pertinent items are noted in HPI Denies abnormal vaginal discharge w/ itching/odor/irritation, headaches, visual changes, shortness of breath, chest pain, abdominal pain, severe nausea/vomiting, or problems with urination or bowel movements unless otherwise stated above. Pertinent History Reviewed:  Reviewed past medical,surgical, social, obstetrical and family history.  Reviewed problem list, medications and allergies. Physical Assessment:   Vitals:   12/01/20 1101  BP: 121/71  Pulse: 81  Weight: 259 lb 8 oz (117.7 kg)  Body mass index is 38.32 kg/m.        Physical Examination:   General appearance: Well appearing, and in no distress  Mental status: Alert, oriented to person, place, and time  Skin: Warm & dry  Cardiovascular: Normal heart rate noted  Respiratory: Normal respiratory effort, no  distress  Abdomen: Soft, gravid, nontender  Pelvic: SSE: BBOW all that is seen, SVE 10/100/+2 w/ BBOW  Dilation: 10 Effacement (%): 100 Station: Plus 2  Extremities: Edema: Trace   NST: FHR baseline 145 bpm, Variability: moderate, Accelerations:present, Decelerations:  Absent= Cat 1/Reactive Toco: q 3-20mins    Fetal Status: Fetal Heart Rate (bpm): 145   Movement: Present Presentation: Vertex  Chaperone: 4m   No results found for this or any previous visit (from the past 24 hour(s)).  Assessment & Plan:  1) Low-risk pregnancy G3P2002 at [redacted]w[redacted]d with an Estimated Date of Delivery: 11/26/20   2) Advanced dilation labor , no urge to push, still intact. To [facility] via EMS accompanied by me. Notified L&D and labor team  3) Prev c/s> w/ successful VBAC, for another VBAC  4) H/O PE> on Lovenox, took dose this am   Meds: No orders of the defined types were placed in this encounter.  Labs/procedures today: spec exam, SVE and NST  Follow-up: No follow-ups on file.  Future Appointments  Date Time Provider Department Center  12/01/2020  3:30 PM 11/28/20, CNM [facility]    No orders of the defined types were placed in this encounter.  [facility] CNM, [facility] 12/01/2020 2:00 PM

## 2020-12-01 NOTE — Progress Notes (Signed)
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 Was cleared with Magda Bernheim. Charge RN aware. Pt aware that they cannot video the actual delivery

## 2020-12-02 LAB — RH IG WORKUP (INCLUDES ABO/RH)
ABO/RH(D): B NEG
Fetal Screen: NEGATIVE
Gestational Age(Wks): 40.5
Unit division: 0

## 2020-12-02 LAB — RPR: RPR Ser Ql: NONREACTIVE

## 2020-12-02 MED ORDER — ENOXAPARIN SODIUM 60 MG/0.6ML ~~LOC~~ SOLN
60.0000 mg | SUBCUTANEOUS | Status: DC
  Administered 2020-12-02 – 2020-12-03 (×2): 60 mg via SUBCUTANEOUS
  Filled 2020-12-02 (×2): qty 0.6

## 2020-12-02 NOTE — Progress Notes (Signed)
Post Partum Day 1 Subjective: no complaints, up ad lib, voiding, tolerating PO and + flatus   Sidelying in bed with infant, just finished breastfeeding. Husband in room for support. Denies pain.  Denies breathing difficulty, respiratory distress, chest pain, abdominal pain or cramping, and leg pain or swelling.  Objective: Blood pressure (!) 104/52, pulse 66, temperature 97.7 F (36.5 C), temperature source Oral, resp. rate 17, last menstrual period 02/20/2020, SpO2 100 %, unknown if currently breastfeeding.  Physical Exam:  General: alert, cooperative and no distress Lochia: appropriate Uterine Fundus: firm Incision: n/a DVT Evaluation: No evidence of DVT seen on physical exam. Negative Homan's sign. No cords or calf tenderness. No significant calf/ankle edema.  Recent Labs    12/01/20 1540  HGB 10.2*  HCT 31.0*    Assessment/Plan: Plan for discharge tomorrow, Breastfeeding and Contraception vasectomy   Patient received 1 dose of Amp. Prior to delivery.  Will continue Lovenox   Juliann Pares, [employer] 12/02/2020, 7:38 AM

## 2020-12-02 NOTE — Progress Notes (Addendum)
Post Partum Day 1 Subjective: Eating, drinking, voiding, ambulating well.  +flatus.  Lochia and pain wnl.  Denies dizziness, lightheadedness, or sob. No complaints.   Objective: Blood pressure (!) 104/52, pulse 66, temperature 97.7 F (36.5 C), temperature source Oral, resp. rate 17, last menstrual period 02/20/2020, SpO2 100 %, unknown if currently breastfeeding.  Physical Exam:  General: alert, cooperative and no distress Lochia: appropriate Uterine Fundus: firm Incision: n/a DVT Evaluation: No evidence of DVT seen on physical exam. Negative Homan's sign. No cords or calf tenderness. No significant calf/ankle edema.  Recent Labs    12/01/20 1540  HGB 10.2*  HCT 31.0*    Assessment/Plan: Plan for discharge tomorrow, Breastfeeding, Lactation consult and Contraception vasectomy  Lovenox 60mg  daily x6wk for h/o PE   LOS: 1 day   12/02/2020, 7:55 AM

## 2020-12-03 MED ORDER — IBUPROFEN 600 MG PO TABS: 600.0000 mg | ORAL_TABLET | Freq: Four times a day (QID) | ORAL | 0 refills | Status: DC | PRN

## 2020-12-03 MED ORDER — ENOXAPARIN SODIUM 60 MG/0.6ML ~~LOC~~ SOLN: 60.0000 mg | SUBCUTANEOUS | 42 refills | Status: DC

## 2020-12-03 NOTE — Discharge Instructions (Signed)

## 2021-01-05 ENCOUNTER — Encounter: Payer: Medicaid Other | Admitting: Women's Health

## 2021-01-05 ENCOUNTER — Encounter: Payer: Self-pay | Admitting: Women's Health

## 2021-01-06 NOTE — Progress Notes (Signed)
Pt scheduled for online pp visit. Does not have bp cuff. Will schedule for in person. This encounter was created in error - please disregard.

## 2021-01-11 ENCOUNTER — Encounter: Payer: Self-pay | Admitting: Women's Health

## 2021-01-11 ENCOUNTER — Other Ambulatory Visit: Payer: Self-pay

## 2021-01-11 ENCOUNTER — Ambulatory Visit (INDEPENDENT_AMBULATORY_CARE_PROVIDER_SITE_OTHER): Payer: Medicaid Other | Admitting: Women's Health

## 2021-01-11 DIAGNOSIS — E039 Hypothyroidism, unspecified: Secondary | ICD-10-CM | POA: Diagnosis not present

## 2021-01-11 DIAGNOSIS — O165 Unspecified maternal hypertension, complicating the puerperium: Secondary | ICD-10-CM

## 2021-01-11 DIAGNOSIS — Z98891 History of uterine scar from previous surgery: Secondary | ICD-10-CM

## 2021-01-11 MED ORDER — AMLODIPINE BESYLATE 5 MG PO TABS: 5.0000 mg | ORAL_TABLET | Freq: Every day | ORAL | 0 refills | Status: DC

## 2021-01-11 MED ORDER — LIDOCAINE HCL 2 % EX GEL: 1.0000 "application " | CUTANEOUS | 0 refills | Status: DC | PRN

## 2021-01-11 MED ORDER — MICONAZOLE NITRATE 2 % EX CREA: 1.0000 "application " | TOPICAL_CREAM | Freq: Two times a day (BID) | CUTANEOUS | 0 refills | Status: DC

## 2021-01-11 NOTE — Progress Notes (Signed)
POSTPARTUM VISIT Patient name: Morgan Nielsen MRN [id number]  Date of birth: 1986/01/10 Chief Complaint:   Postpartum Care (Having trouble with breastfeeding; ? thrush)  History of Present Illness:   Morgan Nielsen is a 35 y.o. G55P3003 African American female being seen today for a postpartum visit. She is 5 weeks postpartum following a vaginal birth after cesarean (VBAC) at 40.5 gestational weeks. IOL: no, for n/a. Anesthesia: none.  Laceration: none.  Complications: none. Inpatient contraception: no.   Pregnancy complicated by hypothyroidism on meds, h/o PE on Lovenox.-stopped Lovenox about a week ago.  Tobacco use: no. Substance use disorder: no. Last pap smear: 06/23/20 and results were NILM w/ HRHPV negative. Next pap smear due: 2024 No LMP recorded.  Postpartum course has been uncomplicated. Denies ha, visual changes, ruq/epigastric pain, n/v.  Does report painful nipples x 1wk, baby not latching as well, LC came out yesterday. Bleeding scant staining. Bowel function is abnormal: feels like glass when has bm, not sure if bleeding, b/c still spotting vaginally. Bladder function is normal. Urinary incontinence? no, fecal incontinence? no Patient is not sexually active. Last sexual activity: prior to birth of baby. Desired contraception: vasectomy. Patient does not want a pregnancy in the future.  Desired family size is 3 children.   The pregnancy intention screening data noted above was reviewed. Potential methods of contraception were discussed. The patient elected to proceed with Female Condom and Vasectomy.   Edinburgh Postpartum Depression Screening: negative  Edinburgh Postnatal Depression Scale - 01/11/21 1011      Edinburgh Postnatal Depression Scale:  In the Past 7 Days   I have been able to laugh and see the funny side of things. 0    I have looked forward with enjoyment to things. 0    I have blamed myself unnecessarily when things went wrong. 1    I have been anxious or  worried for no good reason. 2    I have felt scared or panicky for no good reason. 1    Things have been getting on top of me. 1    I have been so unhappy that I have had difficulty sleeping. 1    I have felt sad or miserable. 1    I have been so unhappy that I have been crying. 0    The thought of harming myself has occurred to me. 0    Edinburgh Postnatal Depression Scale Total 7          Baby's course has been uncomplicated. Baby is feeding by breast: milk supply adequate. Infant has a pediatrician/family doctor? Yes.  Childcare strategy if returning to work/school: n/a-not working/going back to school.  Pt has material needs met for her and baby: Yes.   Review of Systems:   Pertinent items are noted in HPI Denies Abnormal vaginal discharge w/ itching/odor/irritation, headaches, visual changes, shortness of breath, chest pain, abdominal pain, severe nausea/vomiting, or problems with urination or bowel movements. Pertinent History Reviewed:  Reviewed past medical,surgical, obstetrical and family history.  Reviewed problem list, medications and allergies. OB History  Gravida Para Term Preterm AB Living  '3 3 3 ' 0 0 3  SAB IAB Ectopic Multiple Live Births  0 0 0 0 3    # Outcome Date GA Lbr Len/2nd Weight Sex Delivery Anes PTL Lv  3 Term 12/01/20 [redacted]w[redacted]d/ 03:16 8 lb 8 oz (3.856 kg) F VBAC None  LIV  2 Term 01/08/18 440w1d6:59 / 00:24 8 lb 6.2 oz (  3.805 kg) F Vag-Spont None N LIV     Birth Comments: wnl  1 Term 12/07/13 [redacted]w[redacted]d 6 lb 7.9 oz (2.945 kg) F CS-LTranv Spinal N LIV     Complications: Breech presentation, Gestational hypertension   Physical Assessment:   Vitals:   01/11/21 1008 01/11/21 1035  BP: 134/81 138/84  Pulse: 64   Weight: 242 lb (109.8 kg)   Height: '5\' 9"'  (1.753 m)   Body mass index is 35.74 kg/m.       Physical Examination:   General appearance: alert, well appearing, and in no distress  Mental status: alert, oriented to person, place, and time  Skin: warm  & dry   Cardiovascular: normal heart rate noted   Respiratory: normal respiratory effort, no distress   Breasts: deferred, no complaints   Abdomen: soft, non-tender   Pelvic: examination not indicated. Thin prep pap obtained: No  Rectal: not examined  Extremities: Edema: none   Chaperone: N/A         No results found for this or any previous visit (from the past 24 hour(s)).  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  Mood and well being:  . If positive depression screen, discussed and plan developed.  . If using tobacco we discussed reduction/cessation and risk of relapse . If current substance abuse, we discussed and referral to local resources was offered.   2. Infant care and feeding:  . If breastfeeding, discussed returning to work, pumping, breastfeeding-associated pain, guidance regarding return to fertility while lactating if not using another method. If needed, patient was provided with a letter to be allowed to pump q 2-3hrs to support lactation in a private location with access to a refrigerator to store breastmilk.   . Recommended that all caregivers be immunized for flu, pertussis and other preventable communicable diseases . If pt does not have material needs met for her/baby, referred to local resources for help obtaining these.  3. Sexuality, contraception and birth spacing . Provided guidance  regarding sexuality, management of dyspareunia, and resumption of intercourse . Discussed avoiding interpregnancy interval <6mt39mand recommended birth spacing of 18 months  4. Sleep and fatigue . Discussed coping options for fatigue and sleep disruption . Encouraged family/partner/community support of 4 hrs of uninterrupted sleep to help with mood and fatigue  5. Physical recovery  . If pt had a C/S, assessed incisional pain and providing guidance on normal vs prolonged recovery . If pt had a laceration, perineal healing and pain reviewed.  . If urinary or fecal incontinence, discussed management and referred to PT or uro/gyn if indicated  . Patient is safe to resume physical activity. Discussed attainment of healthy weight.  6.  Chronic disease management . Discussed pregnancy complications if any, and their implications for future childbearing and long-term maternal health. . Review recommendations for prevention of recurrent pregnancy complications, such as 17 hydroxyprogesterone caproate to reduce risk for recurrent PTB not applicable, or  aspirin to reduce risk of preeclampsia not applicable. . Pt had GDM: No. If yes, 2hr GTT scheduled: not applicable. . Reviewed medications and non-pregnant dosing including consideration of whether pt is breastfeeding using a reliable resource such as LactMed: yes . Referred for f/u w/ PCP or subspecialist providers as indicated: not applicable  7. Health maintenance . Mammogram at 34yo or earlier if indicated . Pap smears as indicated  Meds:  Meds ordered this encounter  Medications  . lidocaine (XYLOCAINE) 2 % jelly    Sig: Apply 1 application topically as needed. Prior to q bm    Dispense:  30 mL    Refill:  0    Order Specific Question:   Supervising Provider    Answer:   Elonda Husky, LUTHER H [[id number]]  . miconazole (MICOTIN) 2 % cream    Sig: Apply 1 application topically 2 (two) times daily. Wipe off excess prior to nursing    Dispense:  28.35 g     Refill:  0    Order Specific Question:   Supervising Provider    Answer:   Elonda Husky, LUTHER H [[id number]]  . amLODipine (NORVASC) 5 MG tablet    Sig: Take 1 tablet (5 mg total) by mouth daily.    Dispense:  30 tablet    Refill:  0    Order Specific Question:   Supervising Provider    Answer:   Florian Buff [[id number]]    Follow-up: Return in about 2 weeks (around 01/25/2021) for bp check w/ nurse.   Orders Placed This Encounter  Procedures  . TSH    Morgan, [facility] 01/11/2021 11:03 AM

## 2021-01-11 NOTE — Patient Instructions (Addendum)
Stop bp med 2 days before bp check w/ nurse  Constipation  Drink plenty of fluid, preferably water, throughout the day  Eat foods high in fiber such as fruits, vegetables, and grains  Exercise, such as walking, is a good way to keep your bowels regular  Drink warm fluids, especially warm prune juice, or decaf coffee  Eat a 1/2 cup of real oatmeal (not instant), 1/2 cup applesauce, and 1/2-1 cup warm prune juice every day  If needed, you may take Colace (docusate sodium) stool softener once or twice a day to help keep the stool soft. If you are pregnant, wait until you are out of your first trimester (12-14 weeks of pregnancy)  If you still are having problems with constipation, you may take Miralax once daily as needed to help keep your bowels regular.  If you are pregnant, wait until you are out of your first trimester (12-14 weeks of pregnancy)     Anal Fissure, Adult  An anal fissure is a small tear or crack in the tissue of the anus. Bleeding from a fissure usually stops on its own within a few minutes. However, bleeding will often occur again with each bowel movement until the fissure heals. What are the causes? This condition is usually caused by passing a large or hard stool (feces). Other causes include:  Constipation.  Frequent diarrhea.  Inflammatory bowel disease (Crohn's disease or ulcerative colitis).  Childbirth.  Infections.  Anal sex. What are the signs or symptoms? Symptoms of this condition include:  Bleeding from the rectum.  Small amounts of blood seen on your stool, on the toilet paper, or in the toilet after a bowel movement. The blood coats the outside of the stool and is not mixed with the stool.  Painful bowel movements.  Itching or irritation around the anus. How is this diagnosed? A health care provider may diagnose this condition by closely examining the anal area. An anal fissure can usually be seen with careful inspection. In some cases,  a rectal exam may be performed, or a short tube (anoscope) may be used to examine the anal canal. How is this treated? Initial treatment for this condition may include:  Taking steps to avoid constipation. This may include making changes to your diet, such as increasing your intake of fiber or fluid.  Taking fiber supplements. These supplements can soften your stool to help make bowel movements easier. Your health care provider may also prescribe a stool softener if your stool is hard.  Taking sitz baths. This may help to heal the tear.  Using medicated creams or ointments. These may be prescribed to lessen discomfort. Treatments that are sometimes used if initial treatments do not work well or if the condition is more severe may include:  Botulinum injection.  Surgery to repair the fissure. Follow these instructions at home: Eating and drinking  Avoid foods that may cause constipation, such as bananas, milk, and other dairy products.  Eat all fruits, except bananas.  Drink enough fluid to keep your urine pale yellow.  Eat foods that are high in fiber, such as beans, whole grains, and fresh fruits and vegetables.   General instructions  Take over-the-counter and prescription medicines only as told by your health care provider.  Use creams or ointments only as told by your health care provider.  Keep the anal area clean and dry.  Take sitz baths as told by your health care provider. Do not use soap in the sitz baths.  Keep all follow-up visits as told by your health care provider. This is important.   Contact a health care provider if you have:  More bleeding.  A fever.  Diarrhea that is mixed with blood.  Pain that continues.  Ongoing problems that are getting worse rather than better. Summary  An anal fissure is a small tear or crack in the tissue of the anus. This condition is usually caused by passing a large or hard stool (feces). Other causes include  constipation and frequent diarrhea.  Initial treatment for this condition may include taking steps to avoid constipation, such as increasing your intake of fiber or fluid.  Follow instructions for care as told by your health care provider.  Contact your health care provider if you have more bleeding or your problem is getting worse rather than better.  Keep all follow-up visits as told by your health care provider. This is important. This information is not intended to replace advice given to you by your health care provider. Make sure you discuss any questions you have with your health care provider. Document Revised: 03/08/2018 Document Reviewed: 03/08/2018 Elsevier Patient Education  2021 [employer].

## 2021-01-12 LAB — TSH: TSH: 1.78 u[IU]/mL (ref 0.450–4.500)

## 2021-01-20 ENCOUNTER — Ambulatory Visit: Payer: Medicaid Other | Admitting: Women's Health

## 2021-01-25 ENCOUNTER — Ambulatory Visit (INDEPENDENT_AMBULATORY_CARE_PROVIDER_SITE_OTHER): Payer: Medicaid Other | Admitting: *Deleted

## 2021-01-25 ENCOUNTER — Other Ambulatory Visit: Payer: Self-pay

## 2021-01-25 VITALS — BP 127/77 | HR 67

## 2021-01-25 DIAGNOSIS — Z8759 Personal history of other complications of pregnancy, childbirth and the puerperium: Secondary | ICD-10-CM

## 2021-01-25 DIAGNOSIS — Z0130 Encounter for examination of blood pressure without abnormal findings: Secondary | ICD-10-CM

## 2021-01-25 NOTE — Progress Notes (Addendum)
   NURSE VISIT- BLOOD PRESSURE CHECK  SUBJECTIVE:  Morgan Nielsen is a 35 y.o. [id number] female here for BP check. She is postpartum, delivery date 12/01/20    HYPERTENSION ROS:  Postpartum:  . Severe headaches that don't go away with tylenol/other medicines: No  . Visual changes (seeing spots/double/blurred vision) No  . Severe pain under right breast breast or in center of upper chest No  . Severe nausea/vomiting No  . Taking medicines as instructed not applicable   OBJECTIVE:  BP 127/77 (BP Location: Left Arm, Patient Position: Sitting, Cuff Size: Normal)   Pulse 67   Appearance alert, well appearing, and in no distress and oriented to person, place, and time.  ASSESSMENT: Postpartum  blood pressure check  PLAN: Discussed with [name], CNM, [facility]   Recommendations: no changes needed   Follow-up: as scheduled   Jobe Marker  01/25/2021 10:15 AM   Chart reviewed for nurse visit. Agree with plan of care.  Cheral Marker, [place] 01/25/2021 1:15 PM

## 2021-04-01 ENCOUNTER — Other Ambulatory Visit: Payer: Self-pay

## 2021-04-01 ENCOUNTER — Encounter: Payer: Self-pay | Admitting: Obstetrics & Gynecology

## 2021-04-01 ENCOUNTER — Encounter (HOSPITAL_COMMUNITY): Payer: Self-pay | Admitting: Family

## 2021-04-01 ENCOUNTER — Inpatient Hospital Stay (HOSPITAL_COMMUNITY)
Admission: AD | Admit: 2021-04-01 | Discharge: 2021-04-04 | DRG: 885 | Disposition: A | Discharge status: Home / Self Care | Payer: Medicaid Other | Source: Intra-hospital | Attending: Emergency Medicine | Admitting: Emergency Medicine

## 2021-04-01 ENCOUNTER — Ambulatory Visit (INDEPENDENT_AMBULATORY_CARE_PROVIDER_SITE_OTHER): Payer: Medicaid Other | Admitting: Obstetrics & Gynecology

## 2021-04-01 ENCOUNTER — Ambulatory Visit (HOSPITAL_COMMUNITY)
Admission: EM | Admit: 2021-04-01 | Discharge: 2021-04-01 | Disposition: A | Discharge status: Other Acute Inpatient Hospital | Payer: Medicaid Other | Attending: Family | Admitting: Family

## 2021-04-01 ENCOUNTER — Encounter: Payer: Medicaid Other | Admitting: Obstetrics & Gynecology

## 2021-04-01 VITALS — BP 116/78 | HR 61 | Ht 69.0 in | Wt 244.0 lb

## 2021-04-01 DIAGNOSIS — Z811 Family history of alcohol abuse and dependence: Secondary | ICD-10-CM

## 2021-04-01 DIAGNOSIS — F53 Postpartum depression: Secondary | ICD-10-CM

## 2021-04-01 DIAGNOSIS — Z91013 Allergy to seafood: Secondary | ICD-10-CM

## 2021-04-01 DIAGNOSIS — F322 Major depressive disorder, single episode, severe without psychotic features: Secondary | ICD-10-CM

## 2021-04-01 DIAGNOSIS — Z20822 Contact with and (suspected) exposure to covid-19: Secondary | ICD-10-CM | POA: Diagnosis present

## 2021-04-01 DIAGNOSIS — R45851 Suicidal ideations: Secondary | ICD-10-CM | POA: Insufficient documentation

## 2021-04-01 DIAGNOSIS — Z79890 Hormone replacement therapy (postmenopausal): Secondary | ICD-10-CM | POA: Diagnosis not present

## 2021-04-01 DIAGNOSIS — Z833 Family history of diabetes mellitus: Secondary | ICD-10-CM | POA: Diagnosis not present

## 2021-04-01 DIAGNOSIS — Z8249 Family history of ischemic heart disease and other diseases of the circulatory system: Secondary | ICD-10-CM

## 2021-04-01 DIAGNOSIS — F410 Panic disorder [episodic paroxysmal anxiety] without agoraphobia: Secondary | ICD-10-CM | POA: Diagnosis present

## 2021-04-01 DIAGNOSIS — Z809 Family history of malignant neoplasm, unspecified: Secondary | ICD-10-CM

## 2021-04-01 DIAGNOSIS — F430 Acute stress reaction: Secondary | ICD-10-CM | POA: Diagnosis present

## 2021-04-01 DIAGNOSIS — O99345 Other mental disorders complicating the puerperium: Secondary | ICD-10-CM | POA: Diagnosis present

## 2021-04-01 DIAGNOSIS — Z9884 Bariatric surgery status: Secondary | ICD-10-CM | POA: Diagnosis not present

## 2021-04-01 DIAGNOSIS — F332 Major depressive disorder, recurrent severe without psychotic features: Secondary | ICD-10-CM | POA: Diagnosis not present

## 2021-04-01 DIAGNOSIS — R41843 Psychomotor deficit: Secondary | ICD-10-CM | POA: Diagnosis present

## 2021-04-01 DIAGNOSIS — G4700 Insomnia, unspecified: Secondary | ICD-10-CM | POA: Diagnosis present

## 2021-04-01 DIAGNOSIS — Z83438 Family history of other disorder of lipoprotein metabolism and other lipidemia: Secondary | ICD-10-CM

## 2021-04-01 DIAGNOSIS — E039 Hypothyroidism, unspecified: Secondary | ICD-10-CM | POA: Diagnosis present

## 2021-04-01 DIAGNOSIS — G4710 Hypersomnia, unspecified: Secondary | ICD-10-CM | POA: Diagnosis present

## 2021-04-01 DIAGNOSIS — Z79899 Other long term (current) drug therapy: Secondary | ICD-10-CM

## 2021-04-01 DIAGNOSIS — Z6372 Alcoholism and drug addiction in family: Secondary | ICD-10-CM

## 2021-04-01 LAB — CBC WITH DIFFERENTIAL/PLATELET
Abs Immature Granulocytes: 0.02 10*3/uL (ref 0.00–0.07)
Basophils Absolute: 0 10*3/uL (ref 0.0–0.1)
Basophils Relative: 1 %
Eosinophils Absolute: 0.2 10*3/uL (ref 0.0–0.5)
Eosinophils Relative: 3 %
HCT: 39.6 % (ref 36.0–46.0)
Hemoglobin: 12.7 g/dL (ref 12.0–15.0)
Immature Granulocytes: 0 %
Lymphocytes Relative: 39 %
Lymphs Abs: 2.8 10*3/uL (ref 0.7–4.0)
MCH: 26.9 pg (ref 26.0–34.0)
MCHC: 32.1 g/dL (ref 30.0–36.0)
MCV: 83.9 fL (ref 80.0–100.0)
Monocytes Absolute: 0.4 10*3/uL (ref 0.1–1.0)
Monocytes Relative: 6 %
Neutro Abs: 3.7 10*3/uL (ref 1.7–7.7)
Neutrophils Relative %: 51 %
Platelets: 214 10*3/uL (ref 150–400)
RBC: 4.72 MIL/uL (ref 3.87–5.11)
RDW: 15.1 % (ref 11.5–15.5)
WBC: 7.2 10*3/uL (ref 4.0–10.5)
nRBC: 0 % (ref 0.0–0.2)

## 2021-04-01 LAB — COMPREHENSIVE METABOLIC PANEL
ALT: 23 U/L (ref 0–44)
AST: 18 U/L (ref 15–41)
Albumin: 4 g/dL (ref 3.5–5.0)
Alkaline Phosphatase: 81 U/L (ref 38–126)
Anion gap: 8 (ref 5–15)
BUN: 15 mg/dL (ref 6–20)
CO2: 26 mmol/L (ref 22–32)
Calcium: 9.5 mg/dL (ref 8.9–10.3)
Chloride: 104 mmol/L (ref 98–111)
Creatinine, Ser: 0.74 mg/dL (ref 0.44–1.00)
GFR, Estimated: >60 mL/min (ref >60)
Glucose, Bld: 88 mg/dL (ref 70–99)
Potassium: 3.6 mmol/L (ref 3.5–5.1)
Sodium: 138 mmol/L (ref 135–145)
Total Bilirubin: 0.4 mg/dL (ref 0.3–1.2)
Total Protein: 7.2 g/dL (ref 6.5–8.1)

## 2021-04-01 LAB — ETHANOL: Alcohol, Ethyl (B): <10 mg/dL (ref <10)

## 2021-04-01 LAB — LIPID PANEL
Cholesterol: 212 mg/dL — ABNORMAL HIGH (ref 0–200)
HDL: 72 mg/dL (ref >40)
LDL Cholesterol: 129 mg/dL — ABNORMAL HIGH (ref 0–99)
Total CHOL/HDL Ratio: 2.9 RATIO
Triglycerides: 56 mg/dL (ref <150)
VLDL: 11 mg/dL (ref 0–40)

## 2021-04-01 LAB — POCT URINE DRUG SCREEN - MANUAL ENTRY (I-SCREEN)
POC Amphetamine UR: NOT DETECTED
POC Buprenorphine (BUP): NOT DETECTED
POC Cocaine UR: NOT DETECTED
POC Marijuana UR: NOT DETECTED
POC Methadone UR: NOT DETECTED
POC Methamphetamine UR: NOT DETECTED
POC Morphine: NOT DETECTED
POC Oxazepam (BZO): NOT DETECTED
POC Oxycodone UR: NOT DETECTED
POC Secobarbital (BAR): NOT DETECTED

## 2021-04-01 LAB — HEMOGLOBIN A1C
Hgb A1c MFr Bld: 5.8 % — ABNORMAL HIGH (ref 4.8–5.6)
Mean Plasma Glucose: 119.76 mg/dL

## 2021-04-01 LAB — RESP PANEL BY RT-PCR (FLU A&B, COVID) ARPGX2
Influenza A by PCR: NEGATIVE
Influenza B by PCR: NEGATIVE
SARS Coronavirus 2 by RT PCR: NEGATIVE

## 2021-04-01 LAB — MAGNESIUM: Magnesium: 2.1 mg/dL (ref 1.7–2.4)

## 2021-04-01 LAB — TSH: TSH: 3.467 u[IU]/mL (ref 0.350–4.500)

## 2021-04-01 LAB — POC SARS CORONAVIRUS 2 AG: SARSCOV2ONAVIRUS 2 AG: NEGATIVE

## 2021-04-01 LAB — POC SARS CORONAVIRUS 2 AG -  ED: SARS Coronavirus 2 Ag: NEGATIVE

## 2021-04-01 MED ORDER — MAGNESIUM HYDROXIDE 400 MG/5ML PO SUSP: 30.0000 mL | Freq: Every day | ORAL | Status: DC | PRN

## 2021-04-01 MED ORDER — ALUM & MAG HYDROXIDE-SIMETH 200-200-20 MG/5ML PO SUSP: 30.0000 mL | ORAL | Status: DC | PRN

## 2021-04-01 MED ORDER — MELATONIN 3 MG PO TABS
3.0000 mg | ORAL_TABLET | Freq: Every evening | ORAL | Status: DC | PRN
  Filled 2021-04-01: qty 1

## 2021-04-01 MED ORDER — ACETAMINOPHEN 325 MG PO TABS: 650.0000 mg | ORAL_TABLET | Freq: Four times a day (QID) | ORAL | Status: DC | PRN

## 2021-04-01 MED ORDER — ACETAMINOPHEN 325 MG PO TABS
650.0000 mg | ORAL_TABLET | Freq: Four times a day (QID) | ORAL | Status: DC | PRN
  Administered 2021-04-03: 650 mg via ORAL
  Filled 2021-04-01: qty 2

## 2021-04-01 MED ORDER — ALUM & MAG HYDROXIDE-SIMETH 200-200-20 MG/5ML PO SUSP
30.0000 mL | ORAL | Status: DC | PRN
  Administered 2021-04-03: 30 mL via ORAL
  Filled 2021-04-01: qty 30

## 2021-04-01 NOTE — ED Provider Notes (Signed)
Behavioral Health Admission H&P [facility] & OBS)  Date: 04/01/21 Patient Name: Morgan Nielsen MRN: [id number] Chief Complaint:  Chief Complaint  Patient presents with   Suicidal      Diagnoses:  Final diagnoses:  Current severe episode of major depressive disorder without psychotic features without prior episode [facility])    HPI: Patient presents voluntarily to [facility] behavioral health for walk-in assessment.  Patient was referred by outpatient OB/GYN provider, seen earlier this date.  Morgan Nielsen reports "I had a baby 4 months ago, for the last 6 weeks I have not been myself."  She reports depressed mood most days for the past 6 weeks.  She denies any stressors or triggers recently.  She reports worsening depression with suicidal ideations for the past 2 weeks.  She reports increasing and intrusive thoughts of suicide.  She was suicidal with a plan to overdose on medications and alcohol earlier this date.  She is unable to contract for safety at this time.  She reports she notified her husband of thoughts and had him secure alcohol medications in the home.  Aside from depressed mood she reports decreased sleep, decreased appetite and frequent thoughts of death or suicide.  She reports the symptoms began approximately 6 weeks ago.  She reports the symptoms are progressively worsening.  Morgan Nielsen is assessed by nurse practitioner.  She is alert and oriented, answers appropriately.  She is pleasant and cooperative during assessment.  She denies any history of mental health treatment.  She reports she has 2 older children and has not experienced symptoms of depression in the past.  Her current medications include Synthroid, prenatal vitamins and dietary supplements.  She resides in [place] with her husband and 3 daughters. She denies alcohol and substance use.  She denies any history of suicide attempts, denies any history of self-harm.  She denies homicidal ideations.  She denies both  auditory and visual hallucinations.  There is no evidence of delusional thought content and she denies symptoms of paranoia.  Patient offered support and encouragement.  Patient gives verbal consent to reach out to outpatient OB provider ,Dr.Ozan from she has been assessed by mental health team.  PHQ 2-9:  Flowsheet Row Routine Prenatal from 08/24/2020 in [facility] Initial Prenatal from 05/20/2020 in [facility] Initial Prenatal from [facility]/16/2017 in [facility]  Thoughts that you would be better off dead, or of hurting yourself in some way Not at all Not at all Not at all  PHQ-9 Total Score 3 6 3          Total Time spent with patient: 30 minutes  Musculoskeletal  Strength & Muscle Tone: within normal limits Gait & Station: normal Patient leans: N/A  Psychiatric Specialty Exam  Presentation General Appearance: Appropriate for Environment; Casual  Eye Contact:Good  Speech:Clear and Coherent; Normal Rate  Speech Volume:Normal  Handedness:Right   Mood and Affect  Mood:Depressed  Affect:Congruent; Depressed   Thought Process  Thought Processes:Coherent; Goal Directed  Descriptions of Associations:Intact  Orientation:Full (Time, Place and Person)  Thought Content:Logical; WDL    Hallucinations:Hallucinations: None  Ideas of Reference:None  Suicidal Thoughts:Suicidal Thoughts: Yes, Active SI Active Intent and/or Plan: With Plan  Homicidal Thoughts:Homicidal Thoughts: No   Sensorium  Memory:Immediate Good; Recent Good; Remote Good  Judgment:Good  Insight:Good   Executive Functions  Concentration:Good  Attention Span:Good  Recall:Good  Fund of Knowledge:Good  Language:Good   Psychomotor Activity  Psychomotor Activity:Psychomotor Activity: Normal   Assets  Assets:Communication  Skills; Desire for Improvement; Financial Resources/Insurance; Housing; Intimacy; Leisure Time; Physical Health;  Resilience; Social Support; Talents/Skills; Transportation   Sleep  Sleep:Sleep: Fair   Nutritional Assessment (For OBS and FBC admissions only) Has the patient had a weight loss or gain of 10 pounds or more in the last 3 months?: No Has the patient had a decrease in food intake/or appetite?: No Does the patient have dental problems?: No Does the patient have eating habits or behaviors that may be indicators of an eating disorder including binging or inducing vomiting?: No Has the patient recently lost weight without trying?: No Has the patient been eating poorly because of a decreased appetite?: No Malnutrition Screening Tool Score: 0   Physical Exam Vitals and nursing note reviewed.  Constitutional:      Appearance: Normal appearance. She is well-developed.  HENT:     Head: Normocephalic and atraumatic.     Nose: Nose normal.  Cardiovascular:     Rate and Rhythm: Normal rate.  Pulmonary:     Effort: Pulmonary effort is normal.  Musculoskeletal:        General: Normal range of motion.     Cervical back: Normal range of motion.  Neurological:     Mental Status: She is alert and oriented to person, place, and time.  Psychiatric:        Attention and Perception: Attention and perception normal.        Mood and Affect: Affect normal. Mood is depressed.        Speech: Speech normal.        Behavior: Behavior normal. Behavior is cooperative.        Thought Content: Thought content includes suicidal ideation. Thought content includes suicidal plan.        Cognition and Memory: Cognition and memory normal.        Judgment: Judgment normal.   Review of Systems  Constitutional: Negative.   HENT: Negative.    Eyes: Negative.   Respiratory: Negative.    Cardiovascular: Negative.   Gastrointestinal: Negative.   Genitourinary: Negative.   Musculoskeletal: Negative.   Skin: Negative.   Neurological: Negative.   Endo/Heme/Allergies: Negative.   Psychiatric/Behavioral:  Positive  for depression and suicidal ideas.    Blood pressure 133/74, pulse 71, temperature 97.8 F (36.6 C), temperature source Oral, resp. rate 16, SpO2 100 %, currently breastfeeding. There is no height or weight on file to calculate BMI.  Past Psychiatric History: none reported  Is the patient at risk to self? Yes  Has the patient been a risk to self in the past 6 months? No .    Has the patient been a risk to self within the distant past? No   Is the patient a risk to others? No   Has the patient been a risk to others in the past 6 months? No   Has the patient been a risk to others within the distant past? No   Past Medical History:  Past Medical History:  Diagnosis Date   Allergy    Anemia    Asthma    Clotting disorder (HCC)    per bloodwork not clotting disorder   Family history of blood clots 09/13/2011   Family history of blood disorder 09/13/2011   Headache(784.0)    migraines   Hypertension    Hypothyroidism 09/13/2011   Obese 09/13/2011   Pregnancy induced hypertension    Pulmonary embolism (HCC) 05/09/2011   Religious or spiritual beliefs affecting medical care 09/13/2011  doesnt accept blood    Past Surgical History:  Procedure Laterality Date   BARIATRIC SURGERY  2016   CESAREAN SECTION N/A 12/07/2013   Procedure: CESAREAN SECTION;  Surgeon: Esmeralda Arthur, MD;  Location: [facility];  Service: Obstetrics;  Laterality: N/A;   WISDOM TOOTH EXTRACTION     WRIST SURGERY     left torn cartilage 2007    Family History:  Family History  Problem Relation Age of Onset   Hyperlipidemia Mother    Hypertension Mother    Pulmonary embolism Mother    Diabetes Father    Hyperlipidemia Father    Hypertension Father    Cancer Maternal Grandfather        prostate   Pulmonary embolism Maternal Aunt    Deep vein thrombosis Maternal Uncle    Cancer Paternal Aunt        uterine   Pulmonary embolism Maternal Aunt    Pulmonary embolism Maternal Aunt    Congestive Heart Failure  Maternal Grandmother    Alcohol abuse Paternal Grandfather     Social History:  Social History   Socioeconomic History   Marital status: Married    Spouse name: Not on file   Number of children: Not on file   Years of education: Not on file   Highest education level: Not on file  Occupational History   Not on file  Tobacco Use   Smoking status: Never   Smokeless tobacco: Never  Vaping Use   Vaping Use: Never used  Substance and Sexual Activity   Alcohol use: No    Alcohol/week: 1.0 - 2.0 standard drink    Types: 1 - 2 Standard drinks or equivalent per week   Drug use: No   Sexual activity: Not Currently    Partners: Male    Birth control/protection: None  Other Topics Concern   Not on file  Social History Narrative   Not on file   Social Determinants of Health   Financial Resource Strain: Low Risk    Difficulty of Paying Living Expenses: Not hard at all  Food Insecurity: No Food Insecurity   Worried About Programme researcher, broadcasting/film/video in the Last Year: Never true   Ran Out of Food in the Last Year: Never true  Transportation Needs: No Transportation Needs   Lack of Transportation (Medical): No   Lack of Transportation (Non-Medical): No  Physical Activity: Insufficiently Active   Days of Exercise per Week: 3 days   Minutes of Exercise per Session: 20 min  Stress: No Stress Concern Present   Feeling of Stress : Only a little  Social Connections: Press photographer of Communication with Friends and Family: More than three times a week   Frequency of Social Gatherings with Friends and Family: Twice a week   Attends Religious Services: More than 4 times per year   Active Member of [employer] or Organizations: Yes   Attends Engineer, structural: More than 4 times per year   Marital Status: Married  Catering manager Violence: Not At Risk   Fear of Current or Ex-Partner: No   Emotionally Abused: No   Physically Abused: No   Sexually Abused: No    SDOH:   SDOH Screenings   Alcohol Screen: Low Risk    Last Alcohol Screening Score (AUDIT): 0  Depression (PHQ2-9): Low Risk    PHQ-2 Score: 3  Financial Resource Strain: Low Risk    Difficulty of Paying Living Expenses: Not hard at all  Food Insecurity: No Food Insecurity   Worried About Programme researcher, broadcasting/film/video in the Last Year: Never true   Barista in the Last Year: Never true  Housing: Low Risk    Last Housing Risk Score: 0  Physical Activity: Insufficiently Active   Days of Exercise per Week: 3 days   Minutes of Exercise per Session: 20 min  Social Connections: Press photographer of Communication with Friends and Family: More than three times a week   Frequency of Social Gatherings with Friends and Family: Twice a week   Attends Religious Services: More than 4 times per year   Active Member of [employer] or Organizations: Yes   Attends Engineer, structural: More than 4 times per year   Marital Status: Married  Stress: No Stress Concern Present   Feeling of Stress : Only a little  Tobacco Use: Low Risk    Smoking Tobacco Use: Never   Smokeless Tobacco Use: Never  Transportation Needs: No Transportation Needs   Lack of Transportation (Medical): No   Lack of Transportation (Non-Medical): No    Last Labs:  Postpartum Visit on 01/11/2021  Component Date Value Ref Range Status   TSH 01/11/2021 1.780  0.450 - 4.500 uIU/mL Final  Admission on 12/01/2020, Discharged on 12/03/2020  Component Date Value Ref Range Status   SARS Coronavirus 2 by RT PCR 12/01/2020 NEGATIVE  NEGATIVE Final   Comment: (NOTE) SARS-CoV-2 target nucleic acids are NOT DETECTED.  The SARS-CoV-2 RNA is generally detectable in upper respiratory specimens during the acute phase of infection. The lowest concentration of SARS-CoV-2 viral copies this assay can detect is 138 copies/mL. A negative result does not preclude SARS-Cov-2 infection and should not be used as the sole basis for treatment  or other patient management decisions. A negative result may occur with  improper specimen collection/handling, submission of specimen other than nasopharyngeal swab, presence of viral mutation(s) within the areas targeted by this assay, and inadequate number of viral copies(<138 copies/mL). A negative result must be combined with clinical observations, patient history, and epidemiological information. The expected result is Negative.  Fact Sheet for Patients:  BloggerCourse.com  Fact Sheet for Healthcare Providers:  SeriousBroker.it  This test is no                          t yet approved or cleared by the [place] FDA and  has been authorized for detection and/or diagnosis of SARS-CoV-2 by FDA under an Emergency Use Authorization (EUA). This EUA will remain  in effect (meaning this test can be used) for the duration of the COVID-19 declaration under Section 564(b)(1) of the Act, 21 U.S.C.section 360bbb-3(b)(1), unless the authorization is terminated  or revoked sooner.       Influenza A by PCR 12/01/2020 NEGATIVE  NEGATIVE Final   Influenza B by PCR 12/01/2020 NEGATIVE  NEGATIVE Final   Comment: (NOTE) The Xpert Xpress SARS-CoV-2/FLU/RSV plus assay is intended as an aid in the diagnosis of influenza from Nasopharyngeal swab specimens and should not be used as a sole basis for treatment. Nasal washings and aspirates are unacceptable for Xpert Xpress SARS-CoV-2/FLU/RSV testing.  Fact Sheet for Patients: BloggerCourse.com  Fact Sheet for Healthcare Providers: SeriousBroker.it  This test is not yet approved or cleared by the [place] FDA and has been authorized for detection and/or diagnosis of SARS-CoV-2 by FDA under an Emergency Use Authorization (EUA). This EUA will remain in  effect (meaning this test can be used) for the duration of the COVID-19 declaration under  Section 564(b)(1) of the Act, 21 U.S.C. section 360bbb-3(b)(1), unless the authorization is terminated or revoked.  Performed at [facility], [address] [place], [place] [address]    WBC 12/01/2020 13.9 (A) 4.0 - 10.5 K/uL Final   RBC 12/01/2020 3.73 (A) 3.87 - 5.11 MIL/uL Final   Hemoglobin 12/01/2020 10.2 (A) 12.0 - 15.0 g/dL Final   HCT 88/41/6606 31.0 (A) 36.0 - 46.0 % Final   MCV 12/01/2020 83.1  80.0 - 100.0 fL Final   MCH 12/01/2020 27.3  26.0 - 34.0 pg Final   MCHC 12/01/2020 32.9  30.0 - 36.0 g/dL Final   RDW 30/16/0109 15.8 (A) 11.5 - 15.5 % Final   Platelets 12/01/2020 204  150 - 400 K/uL Final   nRBC 12/01/2020 0.0  0.0 - 0.2 % Final   Performed at [facility], [address] [place], [place] [address]   RPR Ser Ql 12/01/2020 NON REACTIVE  NON REACTIVE Final   Performed at [facility], [address], [place], [place] [address]   ABO/RH(D) 12/01/2020    Final                   Value:B NEG Performed at [facility], [address] [place], [place] [address]    Antibody Screen 12/01/2020    Final                   Value:NEG Performed at [facility], [address] [place], [place] [address]    Gestational Age(Wks) 12/01/2020 40.5   Final   ABO/RH(D) 12/01/2020 B NEG   Final   Fetal Screen 12/01/2020 NEG   Final   Unit Number 12/01/2020 [id number]   Final   Blood Component Type 12/01/2020 RHIG   Final   Unit division 12/01/2020 00   Final   Status of Unit 12/01/2020 [facility]   Final   Transfusion Status 12/01/2020    Final                   Value:OK TO TRANSFUSE Performed at [facility], [address] [place], [place] [address]   Routine Prenatal on 11/10/2020  Component Date Value Ref Range Status   Strep Gp B Culture 11/10/2020 Positive (A) Negative Final   Comment: Centers for Disease Control and Prevention (CDC) and American Congress of Obstetricians and Gynecologists (ACOG) guidelines for prevention  of perinatal group B streptococcal (GBS) disease specify co-collection of a vaginal and rectal swab specimen to maximize sensitivity of GBS detection. Per the CDC and ACOG, swabbing both the lower vagina and rectum substantially increases the yield of detection compared with sampling the vagina alone. Penicillin G, ampicillin, or cefazolin are indicated for intrapartum prophylaxis of perinatal GBS colonization. Reflex susceptibility testing should be performed prior to use of clindamycin only on GBS isolates from penicillin-allergic women who are considered a high risk for anaphylaxis. Treatment with vancomycin without additional testing is warranted if resistance to clindamycin is noted.    Neisseria Gonorrhea 11/10/2020 Negative   Final   Chlamydia 11/10/2020 Negative   Final   Comment 11/10/2020 Normal Reference Ranger Chlamydia - Negative   Final   Comment 11/10/2020 Normal Reference Range Neisseria Gonorrhea - Negative   Final   TSH 11/10/2020 1.930  0.450 - 4.500 uIU/mL Final   WBC 11/10/2020 11.1 (A) 3.4 -  10.8 x10E3/uL Final   RBC 11/10/2020 4.17  3.77 - 5.28 x10E6/uL Final   Hemoglobin 11/10/2020 11.2  11.1 - 15.9 g/dL Final   Hematocrit 16/10/960402/10/2020 34.2  34.0 - 46.6 % Final   MCV 11/10/2020 82  79 - 97 fL Final   MCH 11/10/2020 26.9  26.6 - 33.0 pg Final   MCHC 11/10/2020 32.7  31.5 - 35.7 g/dL Final   RDW 54/09/811902/10/2020 13.9  11.7 - 15.4 % Final   Platelets 11/10/2020 218  150 - 450 x10E3/uL Final  Routine Prenatal on 10/06/2020  Component Date Value Ref Range Status   Glucose, UA 10/06/2020 Negative  Negative Final   Ketones, UA 10/06/2020 neg   Final   Blood, UA 10/06/2020 trace   Final   POC,PROTEIN,UA 10/06/2020 Negative  Negative, Trace, Small (1+), Moderate (2+), Large (3+), 4+ Final   Nitrite, UA 10/06/2020 neg   Final   Leukocytes, UA 10/06/2020 Moderate (2+) (A) Negative Final    Allergies: Shellfish allergy  PTA Medications: (Not in a hospital  admission)   Medical Decision Making  Inpatient psychiatric treatment recommended, patient remains voluntary at this time. Agrees with plan for inpatient psychiatric treatment at this time.  Jesika will be [facility] to [facility] for inpatient psychiatric treatment.    Recommendations  Based on my evaluation the patient does not appear to have an emergency medical condition. Patient reviewed with Dr Nelly RoutArchana Kumar.   Lenard Lanceina L Ame Heagle, FNP 04/01/21  4:47 PM

## 2021-04-01 NOTE — Patient Instructions (Signed)
HELPFUL CONTACTS:  Phone First. [facility]- available 24/7:  Your local number is: [phone] If you already have a service provider, call them first.  Have Support Come to You. Crisis situations are often best resolved at home. Mobile Crisis Teams are available 24  hours a day in all counties. Professional counselors will speak with you and your family  during a visit. They have an average response time of 2 hours. This service is provided  by: [facility] [phone]  Go To A Crisis Center. [facility] [address], [place], [place] [address] [phone] Monday -- Friday - 8:00 a.m. - 5:00 p.m.  [facility] [address] [place], [place] [address] [phone] [email or web address]  Additional Resources: https://www.stopbullying.gov/sites/default/files/2017-09/training-module-2016.pdf National Suicide Prevention Lifeline online or at 1-800-273-TALK (8255). https://www.stopbullying.gov/get-help-now/index.html 

## 2021-04-01 NOTE — Progress Notes (Signed)
Received Flor, husband and baby in the lobby. She was breastfeeding her baby. She was cooperative with the admission process. She signed the voluntary form. Her paperwork was prepared for transport.

## 2021-04-01 NOTE — BH Assessment (Signed)
Comprehensive Clinical Assessment (CCA) Note  04/01/2021 Morgan Nielsen [id number]  Per Doran Heater, NP, patient is recommended for inpatient treatment.   Flowsheet Row ED from 04/01/2021 in [facility]  C-SSRS RISK CATEGORY High Risk      The patient demonstrates the following risk factors for suicide: Chronic risk factors for suicide include: N/A. Acute risk factors for suicide include:  recently gave birth . Protective factors for this patient include: positive social support, positive therapeutic relationship, responsibility to others (children, family), and hope for the future. Considering these factors, the overall suicide risk at this point appears to be high. Patient is not appropriate for outpatient follow up.    Morgan Nielsen is a 35 year old female presenting to [facility] due to reporting SI with plan to overdose on medications and ETOH at her OBGYN appointment today. Patient reports she gave birth about 4 months ago and for the last 10-14 days she has not felt like herself and seems like it was getting worse. Patient reports her husband called and made her an appointment to see the OBGYN with concerns that she was having postpartum depression. Patient informed husband of SI and he has removed all medications and alcohol.   Patient is not working currently and lives at home with her husband and three daughters. Patient denies history of suicidal attempts and reports that her paternal family has a history of mental illness, however she is unable to give a diagnosis. Patient denies HI, AVH and substance use. Patient endorses SI with a plan and is unable to contract for safety at this time.       Chief Complaint:  Chief Complaint  Patient presents with   Suicidal   Visit Diagnosis: Current severe episode of major depressive disorder without psychotic features without prior episode (HCC)    CCA Screening, Triage and Referral (STR)  Patient Reported  Information How did you hear about [place]? Other (Comment) (OBGYN)  What Is the Reason for Your Visit/Call Today? SI  / w plan to OD / Alcohol .. post partum  How Long Has This Been Causing You Problems? 1-6 months  What Do You Feel Would Help You the Most Today? Treatment for Depression or other mood problem   Have You Recently Had Any Thoughts About Hurting Yourself? Yes  Are You Planning to Commit Suicide/Harm Yourself At This time? Yes   Have you Recently Had Thoughts About Hurting Someone [name]? No  Are You Planning to Harm Someone at This Time? No  Explanation: No data recorded  Have You Used Any Alcohol or Drugs in the Past 24 Hours? No  How Long Ago Did You Use Drugs or Alcohol? No data recorded What Did You Use and How Much? No data recorded  Do You Currently Have a Therapist/Psychiatrist? No data recorded Name of Therapist/Psychiatrist: No data recorded  Have You Been Recently Discharged From Any Office Practice or Programs? No data recorded Explanation of Discharge From Practice/Program: No data recorded    CCA Screening Triage Referral Assessment Type of Contact: No data recorded Telemedicine Service Delivery:   Is this Initial or Reassessment? No data recorded Date Telepsych consult ordered in [facility]:  No data recorded Time Telepsych consult ordered in [facility]:  No data recorded Location of Assessment: No data recorded Provider Location: No data recorded  Collateral Involvement: No data recorded  Does Patient Have a Court Appointed Legal Guardian? No data recorded Name and Contact of Legal Guardian: No data recorded If Minor and  Not Living with Parent(s), Who has Custody? No data recorded Is CPS involved or ever been involved? No data recorded Is APS involved or ever been involved? No data recorded  Patient Determined To Be At Risk for Harm To Self or Others Based on Review of Patient Reported Information or Presenting Complaint? No data recorded Method: No data  recorded Availability of Means: No data recorded Intent: No data recorded Notification Required: No data recorded Additional Information for Danger to Others Potential: No data recorded Additional Comments for Danger to Others Potential: No data recorded Are There Guns or Other Weapons in Your Home? No data recorded Types of Guns/Weapons: No data recorded Are These Weapons Safely Secured?                            No data recorded Who Could Verify You Are Able To Have These Secured: No data recorded Do You Have any Outstanding Charges, Pending Court Dates, Parole/Probation? No data recorded Contacted To Inform of Risk of Harm To Self or Others: No data recorded   Does Patient Present under Involuntary Commitment? No data recorded IVC Papers Initial File Date: No data recorded  [place] of Residence: No data recorded  Patient Currently Receiving the Following Services: No data recorded  Determination of Need: Urgent (48 hours)   Options For Referral: Outpatient Therapy; Medication Management     CCA Biopsychosocial Patient Reported Schizophrenia/Schizoaffective Diagnosis in Past: No data recorded  Strengths: No data recorded  Mental Health Symptoms Depression:   Change in energy/activity; Difficulty Concentrating; Sleep (too much or little); Tearfulness   Duration of Depressive symptoms:  Duration of Depressive Symptoms: Less than two weeks   Mania:   None   Anxiety:    None   Psychosis:   None   Duration of Psychotic symptoms:    Trauma:   None   Obsessions:   None   Compulsions:   None   Inattention:   None   Hyperactivity/Impulsivity:   None   Oppositional/Defiant Behaviors:   None   Emotional Irregularity:   None   Other Mood/Personality Symptoms:  No data recorded   Mental Status Exam Appearance and self-care  Stature:   Tall   Weight:   Average weight   Clothing:   Neat/clean   Grooming:   Normal   Cosmetic use:   Age  appropriate   Posture/gait:   Normal   Motor activity:   Not Remarkable   Sensorium  Attention:   Normal   Concentration:   Normal   Orientation:   Person; Place; Situation; Time; Object   Recall/memory:   Normal   Affect and Mood  Affect:   Full Range   Mood:   Depressed   Relating  Eye contact:   Normal   Facial expression:   Responsive   Attitude toward examiner:   Cooperative   Thought and Language  Speech flow:  Clear and Coherent   Thought content:   Appropriate to Mood and Circumstances   Preoccupation:   None   Hallucinations:   None   Organization:  No data recorded  [employer] of Knowledge:   Good   Intelligence:   Average   Abstraction:   Normal   Judgement:   Fair   Reality Testing:   Adequate   Insight:   Good   Decision Making:   Normal   Social Functioning  Social Maturity:   Responsible  Social Judgement:   Normal   Stress  Stressors:   Other (Comment)   Coping Ability:   Overwhelmed   Skill Deficits:   None   Supports:   Family; Friends/Service system     Religion:    Leisure/Recreation:    Exercise/Diet: Exercise/Diet Do You Have Any Trouble Sleeping?: Yes   CCA Employment/Education Employment/Work Situation: Employment / Work Situation Employment Situation: Unemployed Patient's Job has Been Impacted by Current Illness: No  Education: Education Is Patient Currently Attending School?: No   CCA Family/Childhood History Family and Relationship History: Family history Marital status: Married What types of issues is patient dealing with in the relationship?: None Does patient have children?: Yes How many children?: 3 How is patient's relationship with their children?: 70 month old  Childhood History:     Child/Adolescent Assessment:     CCA Substance Use Alcohol/Drug Use: Alcohol / Drug Use Pain Medications: See MAR Prescriptions: See MAR Over the  Counter: See MAR History of alcohol / drug use?: No history of alcohol / drug abuse                         ASAM's:  Six Dimensions of Multidimensional Assessment  Dimension 1:  Acute Intoxication and/or Withdrawal Potential:      Dimension 2:  Biomedical Conditions and Complications:      Dimension 3:  Emotional, Behavioral, or Cognitive Conditions and Complications:     Dimension 4:  Readiness to Change:     Dimension 5:  Relapse, Continued use, or Continued Problem Potential:     Dimension 6:  Recovery/Living Environment:     ASAM Severity Score:    ASAM Recommended Level of Treatment:     Substance use Disorder (SUD)    Recommendations for Services/Supports/Treatments:    Discharge Disposition:    DSM5 Diagnoses: Patient Active Problem List   Diagnosis Date Noted   Postpartum hypertension 01/11/2021   History of vaginal delivery following previous cesarean delivery 05/20/2020   Patient is Jehovah's Witness 10/26/2017   History of bariatric surgery 06/23/2017   Rh negative state in antepartum period 05/16/2017   History of gestational hypertension 12/07/2013   Hypothyroidism 09/13/2011   Obese 09/13/2011   Family history of blood disorder 09/13/2011   Pulmonary embolism (HCC) 05/09/2011     Referrals to Alternative Service(s): Referred to Alternative Service(s):   Place:   Date:   Time:    Referred to Alternative Service(s):   Place:   Date:   Time:    Referred to Alternative Service(s):   Place:   Date:   Time:    Referred to Alternative Service(s):   Place:   Date:   Time:     Audree Camel, [facility]

## 2021-04-01 NOTE — Progress Notes (Signed)
Psychoeducational Group Note  Date:  04/01/2021 Time:  2225  Group Topic/Focus:  Wrap-Up Group:   The focus of this group is to help patients review their daily goal of treatment and discuss progress on daily workbooks.  Participation Level: Did Not Attend  Participation Quality:  Not Applicable  Affect:  Not Applicable  Cognitive:  Not Applicable  Insight:  Not Applicable  Engagement in Group: Not Applicable  Additional Comments:  The patient did not attend group this evening.   Hazle Coca S 04/01/2021, 10:25 PM

## 2021-04-01 NOTE — ED Notes (Signed)
[facility] Transport here to transport pt to [facility]

## 2021-04-01 NOTE — Progress Notes (Signed)
POSTPARTUM VISIT Patient name: Morgan Nielsen MRN [id number]  Date of birth: 1985-11-13 Chief Complaint:   postpartum visit  History of Present Illness:   Morgan Nielsen is a 35 y.o. [phone] female being seen today for the following postpartum concern  Postpartum depression:  Delivered 12/01/20- uncomplicated.    Last 10-14 days has not felt like herself.  Thoughts of harming myself, always tearful and crying.  Notes forgetfulness.  When asked if she has a plan- she stated yes.  Her dog has seizures so she has some of those meds at home including Phenobarbital, Valium and BP medication mixed with alcohol.  States she is feeling very overwhelmed.  Last time her mom was a big help and this time she is super busy.  Currently husband is at work  [place] Postpartum Depression Screening: Positive    Edinburgh Postnatal Depression Scale - 04/01/21 1346       Edinburgh Postnatal Depression Scale:  In the Past 7 Days   I have been able to laugh and see the funny side of things. 2    I have looked forward with enjoyment to things. 2    I have blamed myself unnecessarily when things went wrong. 3    I have been anxious or worried for no good reason. 3    I have felt scared or panicky for no good reason. 2    Things have been getting on top of me. 3    I have been so unhappy that I have had difficulty sleeping. 2    I have felt sad or miserable. 3    I have been so unhappy that I have been crying. 2    The thought of harming myself has occurred to me. 3    Edinburgh Postnatal Depression Scale Total 25              Review of Systems:   Pertinent items are noted in HPI Denies Abnormal vaginal discharge w/ itching/odor/irritation, headaches, visual changes, shortness of breath, chest pain, abdominal pain, severe nausea/vomiting, or problems with urination or bowel movements. Pertinent History Reviewed:  Reviewed past medical,surgical, obstetrical and family history.  Reviewed  problem list, medications and allergies. OB History  Gravida Para Term Preterm AB Living  3 3 3  0 0 3  SAB IAB Ectopic Multiple Live Births  0 0 0 0 3    # Outcome Date GA Lbr Len/2nd Weight Sex Delivery Anes PTL Lv  3 Term 12/01/20 [redacted]w[redacted]d / 03:16 8 lb 8 oz (3.856 kg) F VBAC None  LIV  2 Term 01/08/18 [redacted]w[redacted]d 06:59 / 00:24 8 lb 6.2 oz (3.805 kg) F Vag-Spont None N LIV     Birth Comments: wnl  1 Term 12/07/13 [redacted]w[redacted]d  6 lb 7.9 oz (2.945 kg) F CS-LTranv Spinal N LIV     Complications: Breech presentation, Gestational hypertension   Physical Assessment:   Vitals:   04/01/21 1344  BP: 116/78  Pulse: 61  Weight: 244 lb (110.7 kg)  Height: 5\' 9"  (1.753 m)  Body mass index is 36.03 kg/m.       Physical Examination:   General appearance: diaphoretic  Mental status: alert, oriented to person, place, and time  Skin: warm & dry   Cardiovascular: normal heart rate noted   Respiratory: normal respiratory effort, no distress    Chaperone: N/A         No results found for this or any previous visit (from the past 24 hour(s)).  Assessment & Plan:  Postpartum Depression -Behavioral Health notified -spoke with husband, advised him to leave work and come home to take baby and take mom to facility -mom agreeable to inpatient treatment if indicated, further evaluation at [facility] -will likely plan for outpt follow up in 13mo  Meds: No orders of the defined types were placed in this encounter.   Follow-up: Return please print AVS.   No orders of the defined types were placed in this encounter.   Myna Hidalgo, DO Attending Obstetrician & Gynecologist, [facility] for [employer], [facility]

## 2021-04-01 NOTE — ED Notes (Signed)
Pt reports called to [facility] pt has a ready bed. Mrs Merkle reports she does breast feed her new born and has her breast pump here. [facility] called and made aware.

## 2021-04-01 NOTE — Progress Notes (Signed)
This encounter was created in error - please disregard.

## 2021-04-01 NOTE — BH Assessment (Addendum)
Patient present today [facility] reporting symptoms of SI/ w plan to OD on pills and alcohol. Patient was seen today at [facility] and was sent to [facility] for assessment due to SI worsening since given birth.Patient has a 85 month old and OBGYN thinks patient is suffering from post partum . Patient denies HI/ AVH or any substance use. Patient has two other children and never experienced any SI before . Patient is urgent.

## 2021-04-02 DIAGNOSIS — F322 Major depressive disorder, single episode, severe without psychotic features: Principal | ICD-10-CM

## 2021-04-02 LAB — POCT PREGNANCY, URINE: Preg Test, Ur: NEGATIVE

## 2021-04-02 MED ORDER — SERTRALINE HCL 25 MG PO TABS
25.0000 mg | ORAL_TABLET | Freq: Every day | ORAL | Status: AC
  Administered 2021-04-02 – 2021-04-03 (×2): 25 mg via ORAL
  Filled 2021-04-02 (×2): qty 1

## 2021-04-02 MED ORDER — SERTRALINE HCL 50 MG PO TABS
50.0000 mg | ORAL_TABLET | Freq: Every day | ORAL | Status: DC
  Administered 2021-04-04: 50 mg via ORAL
  Filled 2021-04-02 (×3): qty 1

## 2021-04-02 NOTE — Progress Notes (Signed)
Adult Psychoeducational Group Note  Date:  04/02/2021 Time:  8:40 PM  Group Topic/Focus:  Wrap-Up Group:   The focus of this group is to help patients review their daily goal of treatment and discuss progress on daily workbooks.  Participation Level:  Active  Participation Quality:  Appropriate and Attentive  Affect:  Appropriate  Cognitive:  Alert and Appropriate  Insight: Appropriate  Engagement in Group:  Developing/Improving  Modes of Intervention:  Discussion  Additional Comments:  pt showed insight into their goal and how they were going to achieve this.  Suszanne Conners Bassem Bernasconi 04/02/2021, 8:40 PM

## 2021-04-02 NOTE — BHH Group Notes (Signed)
Type of Therapy and Topic:  Group Therapy:  Positive Affirmations   Participation Level:  Did not attend   Description of Group: This group addressed positive affirmation toward self and others. Patients went around the room and identified two positive things about themselves and two positive things about a peer in the room. Patients reflected on how it felt to share something positive with others, to identify positive things about themselves, and to hear positive things from others. Patients were encouraged to have a daily reflection of positive characteristics or circumstances. Therapeutic Goals Patient will verbalize two of their positive qualities Patient will demonstrate empathy for others by stating two positive qualities about a peer in the group Patient will verbalize their feelings when voicing positive self affirmations and when voicing positive affirmations of others Patients will discuss the potential positive impact on their wellness/recovery of focusing on positive traits of self and others. Summary of Patient Progress: Did not attend   Therapeutic Modalities Cognitive Behavioral Therapy Motivational Interviewing 

## 2021-04-02 NOTE — [facility] Suicide Risk Assessment (Signed)
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 The direct patient care time included face-to-face time with the patient, reviewing the patient's chart, communicating with other professionals, and coordinating care. Greater than 50% of this time was spent in counseling or coordinating care with the patient regarding goals of hospitalization, psycho-education, and discharge planning needs.  Principal Problem: MDD (major depressive disorder), single episode, severe (HCC) Diagnosis:  Principal Problem:   MDD (major depressive disorder), single episode, severe (HCC)  Subjective Data: Patient is a 35 year old female who presented to [facility] on 04/01/21 with worsening depression, increased anxiety, and suicidal thoughts with a plan to overdose on phenobarb (has access to med due to dog having seizures), valium, and other medications available in the home. She reports that she is 4 months postpartum and has been having depressive symptoms since the birth of her child. She reports that for the last 10-14 days she has been feeling progressively more depressed. On admission she reported associate neurovegetative symptoms of depression including decreased appetite, poor focus, increased desire to sleep, anhedonia, and  hopelessness. The patient denies AVH or h/o mania/hypomania. She denies drug or alcohol use. She denies previous psychiatric inpatient admissions or previous suicide attempts. She reportedly took Lexapro in 2018 for an acute stress reaction but had sexual side-effects and discontinued the medication. She reported has a h/o alcoholism and mental health issues in the family. She is breastfeeding. See H&P for additional details.   Continued Clinical Symptoms:  Alcohol Use Disorder Identification Test Final Score (AUDIT): 0 The "Alcohol Use Disorders Identification Test", Guidelines for Use in Primary Care, Second Edition.  World Science writer [facility]). Score between 0-7:  no or low risk or alcohol related problems. Score between 8-15:  moderate risk of alcohol related problems. Score between 16-19:  high risk of alcohol related problems. Score 20 or above:  warrants further diagnostic evaluation for alcohol dependence and treatment.  CLINICAL FACTORS:   Depression:   Anhedonia Hopelessness Severe Previous Psychiatric Diagnoses and Treatments  Musculoskeletal: Strength & Muscle Tone: within normal limits Gait & Station: normal, steady Patient leans: N/A  Psychiatric Specialty Exam: Physical Exam Vitals reviewed.  HENT:     Head: Normocephalic.  Pulmonary:     Effort: Pulmonary effort is normal.  Neurological:     General: No focal deficit present.     Mental Status: She is alert.    Review of Systems - see H&P  Blood pressure (!) 123/95, pulse 60, temperature 97.8 F (36.6 C), temperature source Oral, resp. rate 16, height 5\' 9"  (1.753 m), weight 110 kg, SpO2 100 %, currently breastfeeding.Body mass index is 35.81 kg/m.  General Appearance:  casually dressed, well groomed  Eye Contact:  Good  Speech:  Clear and Coherent and Normal Rate  Volume:  Normal  Mood:  Dysphoric  Affect:  Constricted  Thought Process:  Goal Directed  and Linear  Orientation:  Full (Time, Place, and  Person)  Thought Content:  Logical and no evidence of AVH, paranoia or delusions  Suicidal Thoughts:   SI prior to admission with plan but denies current SI and contracts for safety  Homicidal Thoughts:  No  Memory:  Recent;   Good  Judgement:  Good  Insight:  Present  Psychomotor Activity:  Normal  Concentration:  Concentration: Good and Attention Span: Good  Recall:  Good  Fund of Knowledge:  Good  Language:  Good  Akathisia:  Negative  Assets:  Communication Skills Desire for Improvement Housing Resilience Social Support Vocational/Educational  ADL's:  Intact  Cognition:  WNL  Sleep:  Number of Hours: 5.25   COGNITIVE FEATURES THAT CONTRIBUTE TO RISK:  None    SUICIDE RISK:   Moderate:  Frequent suicidal ideation with limited intensity, and duration, some specificity in terms of plans, no associated intent, good self-control, limited dysphoria/symptomatology, some risk factors present, and identifiable protective factors, including available and accessible social support.  PLAN OF CARE: Patient admitted voluntarily to inpatient psychiatry. APP has talked with patient about r/b/se/a to start of Zoloft. I also reviewed with the patient that all medications to a certain degree are excreted in small amounts in breast milk. I advised that Zoloft is generally considered a first-line drug for breastfeeding women, due to documented low levels of exposure in breastfeeding infants and the very low number of adverse events described in case reports. Time was given for questions and she expressed desire to start Zoloft 25mg  daily titrating up to 50mg  daily as tolerated. APP discussion option of IOP after discharge. Admission labs reviewed: CBC WNL, CMP WNL, A!c 5.8, Mag 2.1, ETOH <10, cholesterol 212, triglycerides 56, HDL 72, LDL 129, TSH 3.467, UDS negative, pregnancy test negative, respiratory panel negative. Prolactin level pending.  I certify that inpatient services furnished can  reasonably be expected to improve the patient's condition.   , MD, FAPA 04/02/2021, 5:10 PM

## 2021-04-02 NOTE — [facility] Counselor (Signed)
Adult Comprehensive Assessment  Patient ID: Morgan Nielsen, female   DOB: 28-Aug-1986, 35 y.o.   MRN: [id number]  Information Source: Information source: Patient  Current Stressors:  Patient states their primary concerns and needs for treatment are:: Patient reports that she has been feeling more depressed after the birth of her 3rd child and it has been hard incorporating her other two kids with a newborn. Patient states their goals for this hospitilization and ongoing recovery are:: Patient would like to learn some more coping skills when she feels overwhelmed. Educational / Learning stressors: currently not in school Employment / Job issues: currently not working Family Relationships: Patient reports that her sister and mother live in [place] area and are supportive. Patient lives with supportive husband and 3 children. Financial / Lack of resources (include bankruptcy): Patient reports that she does not have any financial concerns at this time. Housing / Lack of housing: Patient reports housing is stable Physical health (include injuries & life threatening diseases): Patient reports no issues with physical health other than she just had a baby 4 months ago Social relationships: Patient reports family is her social support Substance abuse: none Bereavement / Loss: none reported  Living/Environment/Situation:  Living Arrangements: Spouse/significant other, Children Living conditions (as described by patient or guardian): stable, supportive Who else lives in the home?: Husband and 3 children How long has patient lived in current situation?: Patient has been living with husband for 14 years What is atmosphere in current home: Comfortable, Supportive  Family History:  Marital status: Married Number of Years Married: 14 What types of issues is patient dealing with in the relationship?: none Additional relationship information: none reported Are you sexually active?: Yes What is your  sexual orientation?: straight Has your sexual activity been affected by drugs, alcohol, medication, or emotional stress?: no Does patient have children?: Yes How many children?: 3 How is patient's relationship with their children?: patient reports that she has a good relationship with children  Childhood History:  By whom was/is the patient raised?: Both parents Additional childhood history information: none reported Description of patient's relationship with caregiver when they were a child: Patietn reports that overall it was good, she reports that her relationship with her father was rocky as a teenager because she was stubborn but has improved Patient's description of current relationship with people who raised him/her: good How were you disciplined when you got in trouble as a child/adolescent?: Patient reports that timeouts didn't work. Patient reports that she would get whoopings Does patient have siblings?: Yes Number of Siblings: 1 Description of patient's current relationship with siblings: sister, patient has a good relationship, sister lives in [place] Did patient suffer any verbal/emotional/physical/sexual abuse as a child?: No Did patient suffer from severe childhood neglect?: No Has patient ever been sexually abused/assaulted/raped as an adolescent or adult?: No Was the patient ever a victim of a crime or a disaster?: No Witnessed domestic violence?: No Has patient been affected by domestic violence as an adult?: No  Education:  Highest grade of school patient has completed: Patient finished an associates degree in nursing Currently a student?: No Learning disability?: No  Employment/Work Situation:   Employment Situation: Unemployed Patient's Job has Been Impacted by Current Illness: No What is the Longest Time Patient has Held a Job?: 3 years 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?: No  Financial Resources:    Financial resources: Income from spouse Does patient have a representative  payee or guardian?: No  Alcohol/Substance Abuse:   What has been your use of drugs/alcohol within the last 12 months?: none If attempted suicide, did drugs/alcohol play a role in this?: No Alcohol/Substance Abuse Treatment Hx: Denies past history Has alcohol/substance abuse ever caused legal problems?: No  Social Support System:   Patient's Community Support System: Good Describe Community Support System: Mother, sister and husband Type of faith/religion: [place] Koyanagi witness How does patient's faith help to cope with current illness?: yes  Leisure/Recreation:   Do You Have Hobbies?: Yes Leisure and Hobbies: Patient reports that she doesn't have much time for hobbies with 3 kids but when she feels like she has more time she likes to read  Strengths/Needs:   What is the patient's perception of their strengths?: Patient reports that she tries to be caring and she knows when to ask for help Patient states they can use these personal strengths during their treatment to contribute to their recovery: yes Patient states these barriers may affect/interfere with their treatment: patient reports that it can be sometimes hard to make appointments with three kids and would prefer a virtual option Patient states these barriers may affect their return to the community: none Other important information patient would like considered in planning for their treatment: virtual appointments  Discharge Plan:   Currently receiving community mental health services: No Patient states concerns and preferences for aftercare planning are: virtual appointments Patient states they will know when they are safe and ready for discharge when: none Does patient have access to transportation?: Yes Does patient have financial barriers related to discharge medications?: No Patient description of barriers related to discharge medications: none Will  patient be returning to same living situation after discharge?: Yes  Summary/Recommendations:   Summary and Recommendations (to be completed by the evaluator): Makayah is a 34 year old female who presented to [facility] with increased depression and anxiety and plans to overdose.  Patient is 4 months post partum and reported feeling more overwhelmed and having a hard time incorporating a newborn with her 2 other children.  Patient reports no significant other life stressors at this time.  Patient is currently not coonected with a mental health provider. Recommendations for patient include crisis stabilization, therapeutic miliue, attend and participate in groups, medication management, and development of a comprehensive mental wellness plan.  Zebediah Beezley E Severino Paolo. 04/02/2021

## 2021-04-02 NOTE — Tx Team (Signed)
Interdisciplinary Treatment and Diagnostic Plan Update  04/02/2021 Time of Session:  Morgan Nielsen MRN: [id number]  Principal Diagnosis: <principal problem not specified>  Secondary Diagnoses: Active Problems:   MDD (major depressive disorder), single episode, severe (HCC)   Current Medications:  Current Facility-Administered Medications  Medication Dose Route Frequency Provider Last Rate Last Admin   acetaminophen (TYLENOL) tablet 650 mg  650 mg Oral Q6H PRN Lucky Rathke, FNP       alum & mag hydroxide-simeth (MAALOX/MYLANTA) 200-200-20 MG/5ML suspension 30 mL  30 mL Oral Q4H PRN Lucky Rathke, FNP       magnesium hydroxide (MILK OF MAGNESIA) suspension 30 mL  30 mL Oral Daily PRN Lucky Rathke, FNP       melatonin tablet 3 mg  3 mg Oral QHS PRN Prescilla Sours, PA-C       PTA Medications: Medications Prior to Admission  Medication Sig Dispense Refill Last Dose   albuterol (VENTOLIN HFA) 108 (90 Base) MCG/ACT inhaler Inhale 2 puffs into the lungs every 6 (six) hours as needed for wheezing or shortness of breath.      Cetirizine HCl (ZYRTEC PO) Take by mouth.      Docusate Sodium (COLACE PO) Take by mouth.      levothyroxine (SYNTHROID) 150 MCG tablet Take 1 tablet (150 mcg total) by mouth daily before breakfast. 30 tablet 6    MAGNESIUM PO Take by mouth.      Prenatal Vit-Fe Fumarate-FA (PRENATAL MULTIVITAMIN) TABS tablet Take 1 tablet by mouth at bedtime.      VITAMIN A PO Take by mouth.      VITAMIN D PO Take by mouth.       Patient Stressors: Other: Birth of new born child and depression  Patient Strengths: Ability for insight Average or above average intelligence Capable of independent living Child psychotherapist Motivation for treatment/growth Supportive family/friends  Treatment Modalities: Medication Management, Group therapy, Case management,  1 to 1 session with clinician, Psychoeducation, Recreational therapy.   Physician Treatment Plan for  Primary Diagnosis: <principal problem not specified> Long Term Goal(s):     Short Term Goals:    Medication Management: Evaluate patient's response, side effects, and tolerance of medication regimen.  Therapeutic Interventions: 1 to 1 sessions, Unit Group sessions and Medication administration.  Evaluation of Outcomes: Not Met  Physician Treatment Plan for Secondary Diagnosis: Active Problems:   MDD (major depressive disorder), single episode, severe ([place])  Long Term Goal(s):     Short Term Goals:       Medication Management: Evaluate patient's response, side effects, and tolerance of medication regimen.  Therapeutic Interventions: 1 to 1 sessions, Unit Group sessions and Medication administration.  Evaluation of Outcomes: Not Met   RN Treatment Plan for Primary Diagnosis: <principal problem not specified> Long Term Goal(s): Knowledge of disease and therapeutic regimen to maintain health will improve  Short Term Goals: Ability to participate in decision making will improve, Ability to verbalize feelings will improve, and Ability to identify and develop effective coping behaviors will improve  Medication Management: RN will administer medications as ordered by provider, will assess and evaluate patient's response and provide education to patient for prescribed medication. RN will report any adverse and/or side effects to prescribing provider.  Therapeutic Interventions: 1 on 1 counseling sessions, Psychoeducation, Medication administration, Evaluate responses to treatment, Monitor vital signs and CBGs as ordered, Perform/monitor CIWA, COWS, AIMS and Fall Risk screenings as ordered, Perform wound care treatments as ordered.  Evaluation of Outcomes: Not Met   LCSW Treatment Plan for Primary Diagnosis: <principal problem not specified> Long Term Goal(s): Safe transition to appropriate next level of care at discharge, Engage patient in therapeutic group addressing interpersonal  concerns.  Short Term Goals: Engage patient in aftercare planning with referrals and resources, Increase emotional regulation, Facilitate acceptance of mental health diagnosis and concerns, and Identify triggers associated with mental health/substance abuse issues  Therapeutic Interventions: Assess for all discharge needs, 1 to 1 time with Social worker, Explore available resources and support systems, Assess for adequacy in community support network, Educate family and significant other(s) on suicide prevention, Complete Psychosocial Assessment, Interpersonal group therapy.  Evaluation of Outcomes: Not Met   Progress in Treatment: Attending groups: No. Participating in groups: No. Taking medication as prescribed: Yes. Toleration medication: Yes. Family/Significant other contact made: No, will contact:  will obtain consent Patient understands diagnosis: Yes. Discussing patient identified problems/goals with staff: No. Medical problems stabilized or resolved: Yes. Denies suicidal/homicidal ideation: Yes. Issues/concerns per patient self-inventory: No. Other: None  New problem(s) identified: No, Describe:  None  New Short Term/Long Term Goal(s):medication stabilization, elimination of SI thoughts, development of comprehensive mental wellness plan.   Patient Goals:  Did Not Attend  Discharge Plan or Barriers: Patient recently admitted. CSW will continue to follow and assess for appropriate referrals and possible discharge planning.   Reason for Continuation of Hospitalization: Depression Medication stabilization Suicidal ideation  Estimated Length of Stay: 3-5 days  Attendees: Patient: 04/02/2021 10:47 AM  Physician:  04/02/2021 10:47 AM  Nursing:  04/02/2021 10:47 AM  RN Care Manager: 04/02/2021 10:47 AM  Social Worker: Toney Reil, Latanya Presser 04/02/2021 10:47 AM  Recreational Therapist:  04/02/2021 10:47 AM  Other:  04/02/2021 10:47 AM  Other:  04/02/2021 10:47 AM  Other: 04/02/2021  10:47 AM    Scribe for Treatment Team: Mliss Fritz, LCSWA 04/02/2021 10:47 AM

## 2021-04-02 NOTE — H&P (Signed)
56 Psychiatric Admission Assessment Adult  Patient Identification: Morgan Nielsen MRN:  [id number] Date of Evaluation:  04/02/2021 Chief Complaint:  MDD (major depressive disorder), single episode, severe ([place]) [F32.2] Principal Diagnosis: <principal problem not specified> Diagnosis:  Active Problems:   MDD (major depressive disorder), single episode, severe (HCC) Subjective: Patient is a 35 year old female who presented to [facility] with worsening depression, increased anxiety, suicidal thoughts with a plan to overdose.  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  She reports recently moving into a new home in November.  As per patient" I have not been myself in 1-1/2 months.  I act like everything is okay, when it really is not.  My husband took off work on Tuesday, because he is really concerned.  We called mobile crisis, and I was able to get an appointment with my outpatient nurse practitioner who did not feel comfortable prescribing the medication due to low risk factors and inability to contract for safety."  When describing her symptoms patient reports feeling extremely overwhelmed, panic attacks, anxiety, increased sleeping, decreased appetite, suicidal thoughts with a plan to overdose for 2 weeks.  She denies any weight differences, likely due to breast-feeding.  She also reports feeling really forgetful, impaired memory at times" everything is moving so slow.  If feels slowed down."  She denies any symptoms of mania, psychosis, trauma.  She denies any past psychiatric history of suicide attempts, or nonsuicidal self-injurious behavior.  She reports 1 previous episode of acute stress reaction, in which she was prescribed Lexapro during nursing school in 2018.  She reports medication worked well, however decreased her libido therefore she discontinued after  nursing school.  She is currently breast-feeding, and expresses much concern regarding medications that are safe and effective to manage depression as well as tolerated for the infant.  At current she denies any suicidal thoughts, homicidal thoughts, and or auditory or visual hallucinations.  On evaluation patient is alert and oriented, calm and cooperative, very pleasant upon approach.  She was found in the community restroom, as she was currently pumping there for psychiatric evaluation was initially delayed.  Patient has been pumping on schedule, nursing staff have assisted with storing milk appropriately on ice.  Patient denies any access to weapons, denies any alcohol and or substance abuse.  She reports medication she was planning to overdose, has been given to her husband who has safely secure the medication to include alcohol.  She reports moderate sleep and fair appetite.  She is also open to initiation antidepressant medication, and receiving outpatient behavioral health services through an intensive outpatient programming.  She appears motivated to seek help for treatment, and will fully came into the hospital to get the help that she needs.  Patient denies any auditory and/or visual hallucinations, does not appear to be responding to internal or external stimuli.  There is no evidence of delusional thought content and patient appears to answer all questions appropriately.  At this time patient appears to be appropriate for ongoing inpatient services, as she has high risk for suicide completion due to recent delivery, increased stressors, suicidal thoughts with a plan, patient to be stabilized inpatient with Zoloft.  Following inpatient discharge, patient to benefit from intensive outpatient/partial hospitalization programming for ongoing services.   History of Present Illness:  Morgan Nielsen is a 35 year old female presenting to [facility]  due to reporting SI with plan to overdose on medications and  ETOH at her OBGYN appointment today. Patient reports she gave birth about 4 months ago and for the last 10-14 days she has not felt like herself and seems like it was getting worse. Patient reports her husband called and made her an appointment to see the OBGYN with concerns that she was having postpartum depression. Patient informed husband of SI and he has removed all medications and alcohol.   Patient is not working currently and lives at home with her husband and three daughters. Patient denies history of suicidal attempts and reports that her paternal family has a history of mental illness, however she is unable to give a diagnosis. Patient denies HI, AVH and substance use. Patient endorses SI with a plan and is unable to contract for safety at this time.     Associated Signs/Symptoms: Depression Symptoms:  depressed mood, hypersomnia, psychomotor retardation, fatigue, feelings of worthlessness/guilt, difficulty concentrating, hopelessness, impaired memory, suicidal thoughts with specific plan, anxiety, panic attacks, disturbed sleep, decreased labido, decreased appetite, Duration of Depression Symptoms: Less than two weeks  (Hypo) Manic Symptoms:   Denies Anxiety Symptoms:  Excessive Worry, Panic Symptoms, Social Anxiety, Psychotic Symptoms:   Denies PTSD Symptoms: Negative Total Time spent with patient: 45 minutes  Past Psychiatric History: Acute stress reaction. Took Lexapro in 2018 during nursing school. Denies any previous suicide attempt, suicidal thoughts, or non suicidal self injurious behaviors.   Is the patient at risk to self? Yes.    Has the patient been a risk to self in the past 6 months? No.  Has the patient been a risk to self within the distant past? No.  Is the patient a risk to others? No.  Has the patient been a risk to others in the past 6 months? No.  Has the patient been a risk to others within the distant past? No.   Prior Inpatient Therapy:    Prior Outpatient Therapy:    Alcohol Screening: 1. How often do you have a drink containing alcohol?: Never 2. How many drinks containing alcohol do you have on a typical day when you are drinking?: 1 or 2 3. How often do you have six or more drinks on one occasion?: Never AUDIT-C Score: 0 4. How often during the last year have you found that you were not able to stop drinking once you had started?: Never 5. How often during the last year have you failed to do what was normally expected from you because of drinking?: Never 6. How often during the last year have you needed a first drink in the morning to get yourself going after a heavy drinking session?: Never 7. How often during the last year have you had a feeling of guilt of remorse after drinking?: Never 8. How often during the last year have you been unable to remember what happened the night before because you had been drinking?: Never 9. Have you or someone else been injured as a result of your drinking?: No 10. Has a relative or friend or a doctor or another health worker been concerned about your drinking or suggested you cut down?: No Alcohol Use Disorder Identification Test Final Score (AUDIT): 0 Substance Abuse History in the last 12 months:  No. Consequences of Substance Abuse: Negative Previous Psychotropic Medications:  PReviously took Lexapro prior to graduating nursing school in 2018 Psychological Evaluations: Yes  Past Medical History:  Past Medical History:  Diagnosis Date  Allergy    Anemia    Asthma    Clotting disorder ([place])    per bloodwork not clotting disorder   Family history of blood clots 09/13/2011   Family history of blood disorder 09/13/2011   Headache(784.0)    migraines   Hypertension    Hypothyroidism 09/13/2011   Obese 09/13/2011   Pregnancy induced hypertension    Pulmonary embolism ([place]) 05/09/2011   Religious or spiritual beliefs affecting medical care 09/13/2011   doesnt accept blood    Past  Surgical History:  Procedure Laterality Date   BARIATRIC SURGERY  2016   CESAREAN SECTION N/A 12/07/2013   Procedure: CESAREAN SECTION;  Surgeon: Alwyn Pea, MD;  Location: [facility];  Service: Obstetrics;  Laterality: N/A;   WISDOM TOOTH EXTRACTION     WRIST SURGERY     left torn cartilage 2007   Family History:  Family History  Problem Relation Age of Onset   Hyperlipidemia Mother    Hypertension Mother    Pulmonary embolism Mother    Diabetes Father    Hyperlipidemia Father    Hypertension Father    Cancer Maternal Grandfather        prostate   Pulmonary embolism Maternal Aunt    Deep vein thrombosis Maternal Uncle    Cancer Paternal Aunt        uterine   Pulmonary embolism Maternal Aunt    Pulmonary embolism Maternal Aunt    Congestive Heart Failure Maternal Grandmother    Alcohol abuse Paternal Grandfather    Family Psychiatric  History: Father currently taking Lexapro, symptoms poor controlled; has history of alcoholism. Paternal family history of alcoholism. Paternal aunt history with "nervous breakdown".   Tobacco Screening:   Social History:  Social History   Substance and Sexual Activity  Alcohol Use No   Alcohol/week: 1.0 - 2.0 standard drink   Types: 1 - 2 Standard drinks or equivalent per week     Social History   Substance and Sexual Activity  Drug Use No    Additional Social History:      Allergies:   Allergies  Allergen Reactions   Shellfish Allergy Swelling   Lab Results:  Results for orders placed or performed during the hospital encounter of 04/01/21 (from the past 48 hour(s))  CBC with Differential/Platelet     Status: None   Collection Time: 04/01/21  4:59 PM  Result Value Ref Range   WBC 7.2 4.0 - 10.5 K/uL   RBC 4.72 3.87 - 5.11 MIL/uL   Hemoglobin 12.7 12.0 - 15.0 g/dL   HCT 39.6 36.0 - 46.0 %   MCV 83.9 80.0 - 100.0 fL   MCH 26.9 26.0 - 34.0 pg   MCHC 32.1 30.0 - 36.0 g/dL   RDW 15.1 11.5 - 15.5 %   Platelets 214 150 - 400  K/uL   nRBC 0.0 0.0 - 0.2 %   Neutrophils Relative % 51 %   Neutro Abs 3.7 1.7 - 7.7 K/uL   Lymphocytes Relative 39 %   Lymphs Abs 2.8 0.7 - 4.0 K/uL   Monocytes Relative 6 %   Monocytes Absolute 0.4 0.1 - 1.0 K/uL   Eosinophils Relative 3 %   Eosinophils Absolute 0.2 0.0 - 0.5 K/uL   Basophils Relative 1 %   Basophils Absolute 0.0 0.0 - 0.1 K/uL   Immature Granulocytes 0 %   Abs Immature Granulocytes 0.02 0.00 - 0.07 K/uL    Comment: Performed at [facility], [address] [place], [place] [address]  Comprehensive metabolic panel     Status: None   Collection Time: 04/01/21  4:59 PM  Result Value Ref Range   Sodium 138 135 - 145 mmol/L   Potassium 3.6 3.5 - 5.1 mmol/L   Chloride 104 98 - 111 mmol/L   CO2 26 22 - 32 mmol/L   Glucose, Bld 88 70 - 99 mg/dL    Comment: Glucose reference range applies only to samples taken after fasting for at least 8 hours.   BUN 15 6 - 20 mg/dL   Creatinine, Ser 0.74 0.44 - 1.00 mg/dL   Calcium 9.5 8.9 - 10.3 mg/dL   Total Protein 7.2 6.5 - 8.1 g/dL   Albumin 4.0 3.5 - 5.0 g/dL   AST 18 15 - 41 U/L   ALT 23 0 - 44 U/L   Alkaline Phosphatase 81 38 - 126 U/L   Total Bilirubin 0.4 0.3 - 1.2 mg/dL   GFR, Estimated >60 >60 mL/min    Comment: (NOTE) Calculated using the CKD-EPI Creatinine Equation (2021)    Anion gap 8 5 - 15    Comment: Performed at [facility] [address], [place], [place] [address]  Hemoglobin A1c     Status: Abnormal   Collection Time: 04/01/21  4:59 PM  Result Value Ref Range   Hgb A1c MFr Bld 5.8 (H) 4.8 - 5.6 %    Comment: (NOTE) Pre diabetes:          5.7%-6.4%  Diabetes:              >6.4%  Glycemic control for   <7.0% adults with diabetes    Mean Plasma Glucose 119.76 mg/dL    Comment: Performed at [facility] [address] [place], [place] [address]  Magnesium     Status: None   Collection Time: 04/01/21  4:59 PM  Result Value Ref Range   Magnesium 2.1 1.7 - 2.4 mg/dL     Comment: Performed at [facility] [address], [place], [place] [address]  Ethanol     Status: None   Collection Time: 04/01/21  4:59 PM  Result Value Ref Range   Alcohol, Ethyl (B) <10 <10 mg/dL    Comment: (NOTE) Lowest detectable limit for serum alcohol is 10 mg/dL.  For medical purposes only. Performed at [facility], [place] [address], [place], [place] [address]   Lipid panel     Status: Abnormal   Collection Time: 04/01/21  4:59 PM  Result Value Ref Range   Cholesterol 212 (H) 0 - 200 mg/dL   Triglycerides 56 <150 mg/dL   HDL 72 >40 mg/dL   Total CHOL/HDL Ratio 2.9 RATIO   VLDL 11 0 - 40 mg/dL   LDL Cholesterol 129 (H) 0 - 99 mg/dL    Comment:        Total Cholesterol/HDL:CHD Risk Coronary Heart Disease Risk Table                     Men   Women  1/2 Average Risk   3.4   3.3  Average Risk       5.0   4.4  2 X Average Risk   9.6   7.1  3 X Average Risk  23.4   11.0        Use the calculated Patient Ratio above and the CHD Risk Table to determine the patient's CHD Risk.  ATP III CLASSIFICATION (LDL):  <100     mg/dL   Optimal  100-129  mg/dL   Near or Above                    Optimal  130-159  mg/dL   Borderline  160-189  mg/dL   High  >190     mg/dL   Very High Performed at [facility] [address], [place], [place] [address]   TSH     Status: None   Collection Time: 04/01/21  4:59 PM  Result Value Ref Range   TSH 3.467 0.350 - 4.500 uIU/mL    Comment: Performed by a 3rd Generation assay with a functional sensitivity of <=0.01 uIU/mL. Performed at [facility], [place] [address], [place], [place] [address]   POCT Urine Drug Screen - (ICup)     Status: Normal   Collection Time: 04/01/21  4:59 PM  Result Value Ref Range   POC Amphetamine UR None Detected NONE DETECTED (Cut Off Level 1000 ng/mL)   POC Secobarbital (BAR) None Detected NONE DETECTED (Cut Off Level 300 ng/mL)   POC Buprenorphine (BUP) None Detected NONE DETECTED  (Cut Off Level 10 ng/mL)   POC Oxazepam (BZO) None Detected NONE DETECTED (Cut Off Level 300 ng/mL)   POC Cocaine UR None Detected NONE DETECTED (Cut Off Level 300 ng/mL)   POC Methamphetamine UR None Detected NONE DETECTED (Cut Off Level 1000 ng/mL)   POC Morphine None Detected NONE DETECTED (Cut Off Level 300 ng/mL)   POC Oxycodone UR None Detected NONE DETECTED (Cut Off Level 100 ng/mL)   POC Methadone UR None Detected NONE DETECTED (Cut Off Level 300 ng/mL)   POC Marijuana UR None Detected NONE DETECTED (Cut Off Level 50 ng/mL)  POC SARS Coronavirus 2 Ag-ED - Nasal Swab (BD Veritor Kit)     Status: None   Collection Time: 04/01/21  4:59 PM  Result Value Ref Range   SARS Coronavirus 2 Ag Negative Negative  Resp Panel by RT-PCR (Flu A&B, Covid) Nasopharyngeal Swab     Status: None   Collection Time: 04/01/21  5:00 PM   Specimen: Nasopharyngeal Swab; Nasopharyngeal(NP) swabs in vial transport medium  Result Value Ref Range   SARS Coronavirus 2 by RT PCR NEGATIVE NEGATIVE    Comment: (NOTE) SARS-CoV-2 target nucleic acids are NOT DETECTED.  The SARS-CoV-2 RNA is generally detectable in upper respiratory specimens during the acute phase of infection. The lowest concentration of SARS-CoV-2 viral copies this assay can detect is 138 copies/mL. A negative result does not preclude SARS-Cov-2 infection and should not be used as the sole basis for treatment or other patient management decisions. A negative result may occur with  improper specimen collection/handling, submission of specimen other than nasopharyngeal swab, presence of viral mutation(s) within the areas targeted by this assay, and inadequate number of viral copies(<138 copies/mL). A negative result must be combined with clinical observations, patient history, and epidemiological information. The expected result is Negative.  Fact Sheet for Patients:  EntrepreneurPulse.com.au  Fact Sheet for Healthcare  Providers:  IncredibleEmployment.be  This test is no t yet approved or cleared by the [place] FDA and  has been authorized for detection and/or diagnosis of SARS-CoV-2 by FDA under an Emergency Use Authorization (EUA). This EUA will remain  in effect (meaning this test can be used) for the duration of the COVID-19 declaration under Section 564(b)(1) of the Act, 21 U.S.C.section 360bbb-3(b)(1), unless the authorization  is terminated  or revoked sooner.       Influenza A by PCR NEGATIVE NEGATIVE   Influenza B by PCR NEGATIVE NEGATIVE    Comment: (NOTE) The Xpert Xpress SARS-CoV-2/FLU/RSV plus assay is intended as an aid in the diagnosis of influenza from Nasopharyngeal swab specimens and should not be used as a sole basis for treatment. Nasal washings and aspirates are unacceptable for Xpert Xpress SARS-CoV-2/FLU/RSV testing.  Fact Sheet for Patients: EntrepreneurPulse.com.au  Fact Sheet for Healthcare Providers: IncredibleEmployment.be  This test is not yet approved or cleared by the [place] FDA and has been authorized for detection and/or diagnosis of SARS-CoV-2 by FDA under an Emergency Use Authorization (EUA). This EUA will remain in effect (meaning this test can be used) for the duration of the COVID-19 declaration under Section 564(b)(1) of the Act, 21 U.S.C. section 360bbb-3(b)(1), unless the authorization is terminated or revoked.  Performed at [facility], [place] [address] [place], [place] [address]   POC SARS Coronavirus 2 Ag     Status: None   Collection Time: 04/01/21  5:06 PM  Result Value Ref Range   SARSCOV2ONAVIRUS 2 AG NEGATIVE NEGATIVE    Comment: (NOTE) SARS-CoV-2 antigen NOT DETECTED.   Negative results are presumptive.  Negative results do not preclude SARS-CoV-2 infection and should not be used as the sole basis for treatment or other patient management decisions, including  infection  control decisions, particularly in the presence of clinical signs and  symptoms consistent with COVID-19, or in those who have been in contact with the virus.  Negative results must be combined with clinical observations, patient history, and epidemiological information. The expected result is Negative.  Fact Sheet for Patients: HandmadeRecipes.com.cy  Fact Sheet for Healthcare Providers: FuneralLife.at  This test is not yet approved or cleared by the [place] FDA and  has been authorized for detection and/or diagnosis of SARS-CoV-2 by FDA under an Emergency Use Authorization (EUA).  This EUA will remain in effect (meaning this test can be used) for the duration of  the COV ID-19 declaration under Section 564(b)(1) of the Act, 21 U.S.C. section 360bbb-3(b)(1), unless the authorization is terminated or revoked sooner.    Pregnancy, urine POC     Status: None   Collection Time: 04/01/21  5:09 PM  Result Value Ref Range   Preg Test, Ur NEGATIVE NEGATIVE    Comment:        THE SENSITIVITY OF THIS METHODOLOGY IS >24 mIU/mL     Blood Alcohol level:  Lab Results  Component Value Date   ETH <10 12/29/2246    Metabolic Disorder Labs:  Lab Results  Component Value Date   HGBA1C 5.8 (H) 04/01/2021   MPG 119.76 04/01/2021   No results found for: PROLACTIN Lab Results  Component Value Date   CHOL 212 (H) 04/01/2021   TRIG 56 04/01/2021   HDL 72 04/01/2021   CHOLHDL 2.9 04/01/2021   VLDL 11 04/01/2021   LDLCALC 129 (H) 04/01/2021    Current Medications: Current Facility-Administered Medications  Medication Dose Route Frequency Provider Last Rate Last Admin   acetaminophen (TYLENOL) tablet 650 mg  650 mg Oral Q6H PRN Lucky Rathke, FNP       alum & mag hydroxide-simeth (MAALOX/MYLANTA) 200-200-20 MG/5ML suspension 30 mL  30 mL Oral Q4H PRN Lucky Rathke, FNP       magnesium hydroxide (MILK OF MAGNESIA)  suspension 30 mL  30 mL Oral Daily PRN Lucky Rathke, FNP  melatonin tablet 3 mg  3 mg Oral QHS PRN Prescilla Sours, PA-C       PTA Medications: Medications Prior to Admission  Medication Sig Dispense Refill Last Dose   albuterol (VENTOLIN HFA) 108 (90 Base) MCG/ACT inhaler Inhale 2 puffs into the lungs every 6 (six) hours as needed for wheezing or shortness of breath.      Cetirizine HCl (ZYRTEC PO) Take by mouth.      Docusate Sodium (COLACE PO) Take by mouth.      levothyroxine (SYNTHROID) 150 MCG tablet Take 1 tablet (150 mcg total) by mouth daily before breakfast. 30 tablet 6    MAGNESIUM PO Take by mouth.      Prenatal Vit-Fe Fumarate-FA (PRENATAL MULTIVITAMIN) TABS tablet Take 1 tablet by mouth at bedtime.      VITAMIN A PO Take by mouth.      VITAMIN D PO Take by mouth.       Musculoskeletal: Strength & Muscle Tone: within normal limits Gait & Station: normal Patient leans: N/A            Psychiatric Specialty Exam:  Presentation  General Appearance: Appropriate for Environment; Casual  Eye Contact:Good  Speech:Clear and Coherent; Normal Rate  Speech Volume:Normal  Handedness:Right   Mood and Affect  Mood:Depressed  Affect:Congruent; Depressed   Thought Process  Thought Processes:Coherent; Goal Directed  Duration of Psychotic Symptoms: No data recorded Past Diagnosis of Schizophrenia or Psychoactive disorder: No data recorded Descriptions of Associations:Intact  Orientation:Full (Time, Place and Person)  Thought Content:Logical; WDL  Hallucinations:Hallucinations: None  Ideas of Reference:None  Suicidal Thoughts:Suicidal Thoughts: Yes, Active SI Active Intent and/or Plan: With Plan  Homicidal Thoughts:Homicidal Thoughts: No   Sensorium  Memory:Immediate Good; Recent Good; Remote Good  Judgment:Good  Insight:Good   Executive Functions  Concentration:Good  Attention Span:Good  [place] of  Knowledge:Good  Language:Good   Psychomotor Activity  Psychomotor Activity:Psychomotor Activity: Normal   Assets  Assets:Communication Skills; Desire for Improvement; Financial Resources/Insurance; Housing; Intimacy; Leisure Time; Physical Health; Resilience; Social Support; Talents/Skills; Transportation   Sleep  Sleep:Sleep: Fair    Physical Exam: Physical Exam ROS Blood pressure (!) 123/95, pulse 60, temperature 97.8 F (36.6 C), temperature source Oral, resp. rate 16, height '5\' 9"'  (1.753 m), weight 110 kg, SpO2 100 %, currently breastfeeding. Body mass index is 35.81 kg/m.  Treatment Plan Summary: Daily contact with patient to assess and evaluate symptoms and progress in treatment, Medication management, and Plan Will start ZOloft 78m po daily x 2 days, then increase ZOloft 520mpo daily starting 04/04/2021. Patient will benefit from IOP services  PHP upon discharge.   Observation Level/Precautions:  15 minute checks  Laboratory:   Labs obtained and reviewed  Psychotherapy:  Group therapy  Medications:  ZOloft   Consultations:  None  Discharge Concerns:  Referral to IOP/PHP Possibly [facility].   Estimated LOS:2-4 days  Other:     Physician Treatment Plan for Primary Diagnosis: <principal problem not specified> Long Term Goal(s): Improvement in symptoms so as ready for discharge  Short Term Goals: Ability to identify changes in lifestyle to reduce recurrence of condition will improve, Ability to verbalize feelings will improve, Ability to disclose and discuss suicidal ideas, and Ability to demonstrate self-control will improve  Physician Treatment Plan for Secondary Diagnosis: Active Problems:   MDD (major depressive disorder), single episode, severe ([place] Long Term Goal(s): Improvement in symptoms so as ready for discharge  Short Term Goals: Ability to demonstrate self-control  will improve, Ability to identify and develop effective coping behaviors will  improve, Ability to maintain clinical measurements within normal limits will improve, and Compliance with prescribed medications will improve  I certify that inpatient services furnished can reasonably be expected to improve the patient's condition.    Suella Broad, FNP 6/24/202211:02 AM

## 2021-04-02 NOTE — Progress Notes (Signed)
Adult Psychoeducational Group Note  Date:  04/02/2021 Time:  10:39 AM  Group Topic/Focus:  Goals Group:   The focus of this group is to help patients establish daily goals to achieve during treatment and discuss how the patient can incorporate goal setting into their daily lives to aide in recovery.  Participation Level:  Active  Participation Quality:  Appropriate  Affect:  Appropriate  Cognitive:  Appropriate  Insight: Appropriate  Engagement in Group:  Engaged  Modes of Intervention:  Discussion  Additional Comments:  Pt stated his goal for the day is to meet with doctors and develop a plan.  Wynema Birch D 04/02/2021, 10:39 AM

## 2021-04-02 NOTE — Progress Notes (Signed)
Pt has been pleasant and cooperative this shift.  She brightens easily on approach and has been observed smiling and interacting with her peers.  She denies SI/HI/AVH and has attended groups on and off the unit. She is continuing to breast pump with no problems this shift.   04/02/21 1000  Psych Admission Type (Psych Patients Only)  Admission Status Voluntary  Psychosocial Assessment  Patient Complaints Apathy  Eye Contact Fair  Facial Expression Anxious;Animated  Affect Anxious;Depressed  Speech Logical/coherent;Soft  Interaction Assertive  Motor Activity Other (Comment) (WDL)  Appearance/Hygiene Unremarkable  Behavior Characteristics Cooperative;Appropriate to situation;Anxious  Mood Depressed;Anxious  Thought Process  Coherency WDL  Content WDL  Delusions None reported or observed  Perception WDL  Hallucination None reported or observed  Judgment WDL  Confusion None  Danger to Self  Current suicidal ideation? Denies  Danger to Others  Danger to Others None reported or observed      COVID-19 Daily Checkoff  Have you had a fever (temp > 37.80C/100F)  in the past 24 hours?  No  If you have had runny nose, nasal congestion, sneezing in the past 24 hours, has it worsened? No  COVID-19 EXPOSURE  Have you traveled outside the state in the past 14 days? No  Have you been in contact with someone with a confirmed diagnosis of COVID-19 or PUI in the past 14 days without wearing appropriate PPE? No  Have you been living in the same home as a person with confirmed diagnosis of COVID-19 or a PUI (household contact)? No  Have you been diagnosed with COVID-19? No

## 2021-04-02 NOTE — Progress Notes (Signed)
Patient ID: NNEOMA HARRAL, female   DOB: 01-04-1986, 35 y.o.   MRN: [id number]  Admission Note:  Pt admitted from [facility] as a Vol admit for increasing depression and SI with plan to overdose on "pills and alcohol." She stated that "I have not been myself since the birth of my 68 month old baby." She was ref for mental eval by her OBG-YN for increasing SI. She currently denies SI/HI/AVH or self harm thoughts and is pleasant and cooperative with admission process. Pt stated that she brought her breast pump because she is still breast feeding. Pt was allowed time to ask questions and was offered nourishment.  Admission paperwork completed and signed.  Belongings searched and with no contraband found.  Skin search completed and no skin issues noted.  Pt oriented to unit and escorted to her room. Q15 minute checks initiated for safety and support provided as needed.

## 2021-04-02 NOTE — Progress Notes (Signed)
Recreation Therapy Notes  Date:  6.24.22 Time: 0930 Location: [address] Hall Dayroom   Group Topic: Stress Management   Goal Area(s) Addresses: Patient will identify positive stress management techniques. Patient will identify benefits of using stress management post d/c.   Intervention: Stress Management    Activity: Meditation.  LRT play a meditation for patients to calm and relax the body.     Education:  Stress Management, Discharge Planning.   Education Outcome: Acknowledges Education   Clinical Observations/Feedback:  LRT unable to do group due to fire drill and goals group running over time.     Dat Derksen, LRT/CTRS         Neeko Pharo A 04/02/2021 10:52 AM 

## 2021-04-02 NOTE — Tx Team (Signed)
Initial Treatment Plan 04/02/2021 12:26 AM Morgan Nielsen [id number]    PATIENT STRESSORS: Other: Birth of new born child and depression   PATIENT STRENGTHS: Ability for insight Average or above average intelligence Capable of independent living Advertising account executive for treatment/growth Supportive family/friends   PATIENT IDENTIFIED PROBLEMS: Risk of Suicide  Alteration in mood /  Ineffective coping  "I need help with coping with the new born baby"                 DISCHARGE CRITERIA:  Improved stabilization in mood, thinking, and/or behavior Motivation to continue treatment in a less acute level of care Need for constant or close observation no longer present  PRELIMINARY DISCHARGE PLAN: Attend aftercare/continuing care group Return to previous living arrangement  PATIENT/FAMILY INVOLVEMENT: This treatment plan has been presented to and reviewed with the patient, [name].  The patient  has been given the opportunity to ask questions and make suggestions.  [name], RN 04/02/2021, 12:26 AM

## 2021-04-03 LAB — PROLACTIN: Prolactin: 186 ng/mL — ABNORMAL HIGH (ref 4.8–23.3)

## 2021-04-03 MED ORDER — LEVOTHYROXINE SODIUM 150 MCG PO TABS
150.0000 ug | ORAL_TABLET | Freq: Every day | ORAL | Status: DC
  Administered 2021-04-03 – 2021-04-04 (×2): 150 ug via ORAL
  Filled 2021-04-03 (×4): qty 1

## 2021-04-03 NOTE — [facility] Group Notes (Signed)
LCSW Group Therapy Note  04/03/2021   10:00-11:00am   Type of Therapy and Topic:  Group Therapy: Anger Cues and Responses  Participation Level:  Active   Description of Group:   In this group, patients learned how to recognize the physical, cognitive, emotional, and behavioral responses they have to anger-provoking situations.  They identified a recent time they became angry and how they reacted.  They analyzed how their reaction was possibly beneficial and how it was possibly unhelpful.  The group discussed a variety of healthier coping skills that could help with such a situation in the future.  Focus was placed on how helpful it is to recognize the underlying emotions to our anger, because working on those can lead to a more permanent solution as well as our ability to focus on the important rather than the urgent.  Therapeutic Goals: Patients will remember their last incident of anger and how they felt emotionally and physically, what their thoughts were at the time, and how they behaved. Patients will identify how their behavior at that time worked for them, as well as how it worked against them. Patients will explore possible new behaviors to use in future anger situations. Patients will learn that anger itself is normal and cannot be eliminated, and that healthier reactions can assist with resolving conflict rather than worsening situations.  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  Therapeutic Modalities:   Cognitive Behavioral Therapy  Evorn Gong

## 2021-04-03 NOTE — Progress Notes (Signed)
Patient has been up and active on the unit, attended group this evening and has voiced no complaints. Patient currently denies having pain, -si/hi/a/v hall. Support and encouragement offered, safety maintained on unit, will continue to monitor.  

## 2021-04-03 NOTE — Progress Notes (Signed)
Patient expressed in group that she was pleased that she has a nice roommate to talk to. Her goal for tomorrow is to manage her expectations.

## 2021-04-03 NOTE — [facility] Group Notes (Signed)
Adult Psychoeducational Group Note  Date:  04/03/2021 Time:  11:17 AM  Group Topic/Focus:  Coping With Mental Health Crisis:   The purpose of this group is to help patients identify strategies for coping with mental health crisis.  Group discusses possible causes of crisis and ways to manage them effectively.  Participation Level:  Active  Participation Quality:  Appropriate  Affect:  Appropriate  Cognitive:  Appropriate  Insight: Appropriate  Engagement in Group:  Engaged  Modes of Intervention:  Discussion  Additional Comments:  Patient attended coping skills group and participated.   Nassim Cosma W Janari Yamada 04/03/2021, 11:17 AM

## 2021-04-03 NOTE — Progress Notes (Signed)
[facility] MD Progress Note  04/03/2021 1:11 PM Morgan Nielsen  MRN:  [id number] Subjective:  "I feel really good today. I slept well and feel like I can cope."   Objective: Morgan Nielsen is a 35 year old female who presented to [facility] with worsening depression, increased anxiety, suicidal thoughts with a plan to overdose.  Evaluation on the unit: Patient was seen and evaluated, chart reviewed and case discussed with the treatment team. Patient stated she feels really good today. She stated she slept very well last night and her appetite is good. She slept 4.75 hours last night. She stated she was feeling very overwhelmed with a 80 month old (breast feeding), a 44 and 34 year old and being at home with the children all day. Her husband works outside the home. She stated she was so overwhelmed she started having suicidal thoughts to drive her car into a tree. She stated the only thing that stopped her was that she had her girls with her. Patient stated she has never had post-partum depression before. She stated she has not been letting on to her husband or family members that she is not okay. She stated she was trying to manage without asking for help. She stated she realizes this was a mistake. She denies suicidal ideation or plan today. She denies homicidal ideation and auditory or visual hallucinations. She does not appear to be responding to internal stimuli. She is taking her medications and does not report any side effects. She stated she hopes to be discharged tomorrow. She is breastfeeding her infant and her frozen milk supply is running very low. Will continue to monitor and provide encouragement.   Principal Problem: MDD (major depressive disorder), single episode, severe ([place]) Diagnosis: Principal Problem:   MDD (major depressive disorder), single episode, severe ([place])  Total Time spent with patient:  25 minutes  Past Psychiatric History: See H&P  Past Medical  History:  Past Medical History:  Diagnosis Date   Allergy    Anemia    Asthma    Clotting disorder ([place])    per bloodwork not clotting disorder   Family history of blood clots 09/13/2011   Family history of blood disorder 09/13/2011   Headache(784.0)    migraines   Hypertension    Hypothyroidism 09/13/2011   Obese 09/13/2011   Pregnancy induced hypertension    Pulmonary embolism ([place]) 05/09/2011   Religious or spiritual beliefs affecting medical care 09/13/2011   doesnt accept blood    Past Surgical History:  Procedure Laterality Date   BARIATRIC SURGERY  2016   CESAREAN SECTION N/A 12/07/2013   Procedure: CESAREAN SECTION;  Surgeon: Alwyn Pea, MD;  Location: [facility];  Service: Obstetrics;  Laterality: N/A;   WISDOM TOOTH EXTRACTION     WRIST SURGERY     left torn cartilage 2007   Family History:  Family History  Problem Relation Age of Onset   Hyperlipidemia Mother    Hypertension Mother    Pulmonary embolism Mother    Diabetes Father    Hyperlipidemia Father    Hypertension Father    Cancer Maternal Grandfather        prostate   Pulmonary embolism Maternal Aunt    Deep vein thrombosis Maternal Uncle    Cancer Paternal Aunt        uterine   Pulmonary embolism Maternal Aunt    Pulmonary embolism Maternal Aunt    Congestive Heart Failure Maternal Grandmother    Alcohol abuse  Paternal Grandfather    Family Psychiatric  History: See H&P Social History:  Social History   Substance and Sexual Activity  Alcohol Use No   Alcohol/week: 1.0 - 2.0 standard drink   Types: 1 - 2 Standard drinks or equivalent per week     Social History   Substance and Sexual Activity  Drug Use No    Social History   Socioeconomic History   Marital status: Married    Spouse name: Not on file   Number of children: Not on file   Years of education: Not on file   Highest education level: Not on file  Occupational History   Not on file  Tobacco Use   Smoking status: Never    Smokeless tobacco: Never  Vaping Use   Vaping Use: Never used  Substance and Sexual Activity   Alcohol use: No    Alcohol/week: 1.0 - 2.0 standard drink    Types: 1 - 2 Standard drinks or equivalent per week   Drug use: No   Sexual activity: Not Currently    Partners: Male    Birth control/protection: None  Other Topics Concern   Not on file  Social History Narrative   Not on file   Social Determinants of Health   Financial Resource Strain: Low Risk    Difficulty of Paying Living Expenses: Not hard at all  Food Insecurity: No Food Insecurity   Worried About Charity fundraiser in the Last Year: Never true   [place] in the Last Year: Never true  Transportation Needs: No Transportation Needs   Lack of Transportation (Medical): No   Lack of Transportation (Non-Medical): No  Physical Activity: Insufficiently Active   Days of Exercise per Week: 3 days   Minutes of Exercise per Session: 20 min  Stress: No Stress Concern Present   Feeling of Stress : Only a little  Social Connections: Engineer, building services of Communication with Friends and Family: More than three times a week   Frequency of Social Gatherings with Friends and Family: Twice a week   Attends Religious Services: More than 4 times per year   Active Member of [employer] or Organizations: Yes   Attends Music therapist: More than 4 times per year   Marital Status: Married   Additional Social History:       Sleep: Good  Appetite:  Good  Current Medications: Current Facility-Administered Medications  Medication Dose Route Frequency Provider Last Rate Last Admin   acetaminophen (TYLENOL) tablet 650 mg  650 mg Oral Q6H PRN Lucky Rathke, FNP       alum & mag hydroxide-simeth (MAALOX/MYLANTA) 200-200-20 MG/5ML suspension 30 mL  30 mL Oral Q4H PRN Lucky Rathke, FNP       levothyroxine (SYNTHROID) tablet 150 mcg  150 mcg Oral Q0600 Harlow Asa, MD   150 mcg at 04/03/21 1154   magnesium  hydroxide (MILK OF MAGNESIA) suspension 30 mL  30 mL Oral Daily PRN Lucky Rathke, FNP       melatonin tablet 3 mg  3 mg Oral QHS PRN Margorie John W, PA-C       [START ON 04/04/2021] sertraline (ZOLOFT) tablet 50 mg  50 mg Oral Daily Suella Broad, FNP        Lab Results:  Results for orders placed or performed during the hospital encounter of 04/01/21 (from the past 48 hour(s))  CBC with Differential/Platelet     Status:  None   Collection Time: 04/01/21  4:59 PM  Result Value Ref Range   WBC 7.2 4.0 - 10.5 K/uL   RBC 4.72 3.87 - 5.11 MIL/uL   Hemoglobin 12.7 12.0 - 15.0 g/dL   HCT 39.6 36.0 - 46.0 %   MCV 83.9 80.0 - 100.0 fL   MCH 26.9 26.0 - 34.0 pg   MCHC 32.1 30.0 - 36.0 g/dL   RDW 15.1 11.5 - 15.5 %   Platelets 214 150 - 400 K/uL   nRBC 0.0 0.0 - 0.2 %   Neutrophils Relative % 51 %   Neutro Abs 3.7 1.7 - 7.7 K/uL   Lymphocytes Relative 39 %   Lymphs Abs 2.8 0.7 - 4.0 K/uL   Monocytes Relative 6 %   Monocytes Absolute 0.4 0.1 - 1.0 K/uL   Eosinophils Relative 3 %   Eosinophils Absolute 0.2 0.0 - 0.5 K/uL   Basophils Relative 1 %   Basophils Absolute 0.0 0.0 - 0.1 K/uL   Immature Granulocytes 0 %   Abs Immature Granulocytes 0.02 0.00 - 0.07 K/uL    Comment: Performed at [facility], [address] [place], [place] [address]  Comprehensive metabolic panel     Status: None   Collection Time: 04/01/21  4:59 PM  Result Value Ref Range   Sodium 138 135 - 145 mmol/L   Potassium 3.6 3.5 - 5.1 mmol/L   Chloride 104 98 - 111 mmol/L   CO2 26 22 - 32 mmol/L   Glucose, Bld 88 70 - 99 mg/dL    Comment: Glucose reference range applies only to samples taken after fasting for at least 8 hours.   BUN 15 6 - 20 mg/dL   Creatinine, Ser 0.74 0.44 - 1.00 mg/dL   Calcium 9.5 8.9 - 10.3 mg/dL   Total Protein 7.2 6.5 - 8.1 g/dL   Albumin 4.0 3.5 - 5.0 g/dL   AST 18 15 - 41 U/L   ALT 23 0 - 44 U/L   Alkaline Phosphatase 81 38 - 126 U/L   Total Bilirubin 0.4 0.3 - 1.2  mg/dL   GFR, Estimated >60 >60 mL/min    Comment: (NOTE) Calculated using the CKD-EPI Creatinine Equation (2021)    Anion gap 8 5 - 15    Comment: Performed at [facility] [address] [place], [place] [address]  Hemoglobin A1c     Status: Abnormal   Collection Time: 04/01/21  4:59 PM  Result Value Ref Range   Hgb A1c MFr Bld 5.8 (H) 4.8 - 5.6 %    Comment: (NOTE) Pre diabetes:          5.7%-6.4%  Diabetes:              >6.4%  Glycemic control for   <7.0% adults with diabetes    Mean Plasma Glucose 119.76 mg/dL    Comment: Performed at [facility] [address], [place], [place] [address]  Magnesium     Status: None   Collection Time: 04/01/21  4:59 PM  Result Value Ref Range   Magnesium 2.1 1.7 - 2.4 mg/dL    Comment: Performed at [facility] [address] [place], [place] [address]  Ethanol     Status: None   Collection Time: 04/01/21  4:59 PM  Result Value Ref Range   Alcohol, Ethyl (B) <10 <10 mg/dL    Comment: (NOTE) Lowest detectable limit for serum alcohol is 10 mg/dL.  For medical purposes only. Performed at [facility], [place] [address] [place], [place] [address]   Lipid panel     Status: Abnormal   Collection Time: 04/01/21  4:59 PM  Result Value Ref Range   Cholesterol 212 (H) 0 - 200 mg/dL   Triglycerides 56 <150 mg/dL   HDL 72 >40 mg/dL   Total CHOL/HDL Ratio 2.9 RATIO   VLDL 11 0 - 40 mg/dL   LDL Cholesterol 129 (H) 0 - 99 mg/dL    Comment:        Total Cholesterol/HDL:CHD Risk Coronary Heart Disease Risk Table                     Men   Women  1/2 Average Risk   3.4   3.3  Average Risk       5.0   4.4  2 X Average Risk   9.6   7.1  3 X Average Risk  23.4   11.0        Use the calculated Patient Ratio above and the CHD Risk Table to determine the patient's CHD Risk.        ATP III CLASSIFICATION (LDL):  <100     mg/dL   Optimal  100-129  mg/dL   Near or Above                    Optimal  130-159  mg/dL    Borderline  160-189  mg/dL   High  >190     mg/dL   Very High Performed at [facility] [address], [place], [place] [address]   TSH     Status: None   Collection Time: 04/01/21  4:59 PM  Result Value Ref Range   TSH 3.467 0.350 - 4.500 uIU/mL    Comment: Performed by a 3rd Generation assay with a functional sensitivity of <=0.01 uIU/mL. Performed at [facility], [place] [address] [place], [place] [address]   Prolactin     Status: Abnormal   Collection Time: 04/01/21  4:59 PM  Result Value Ref Range   Prolactin 186.0 (H) 4.8 - 23.3 ng/mL    Comment: (NOTE) Performed At: [facility] [place], [place] [id number] Rush Farmer MD [id number]   POCT Urine Drug Screen - (ICup)     Status: Normal   Collection Time: 04/01/21  4:59 PM  Result Value Ref Range   POC Amphetamine UR None Detected NONE DETECTED (Cut Off Level 1000 ng/mL)   POC Secobarbital (BAR) None Detected NONE DETECTED (Cut Off Level 300 ng/mL)   POC Buprenorphine (BUP) None Detected NONE DETECTED (Cut Off Level 10 ng/mL)   POC Oxazepam (BZO) None Detected NONE DETECTED (Cut Off Level 300 ng/mL)   POC Cocaine UR None Detected NONE DETECTED (Cut Off Level 300 ng/mL)   POC Methamphetamine UR None Detected NONE DETECTED (Cut Off Level 1000 ng/mL)   POC Morphine None Detected NONE DETECTED (Cut Off Level 300 ng/mL)   POC Oxycodone UR None Detected NONE DETECTED (Cut Off Level 100 ng/mL)   POC Methadone UR None Detected NONE DETECTED (Cut Off Level 300 ng/mL)   POC Marijuana UR None Detected NONE DETECTED (Cut Off Level 50 ng/mL)  POC SARS Coronavirus 2 Ag-ED - Nasal Swab (BD Veritor Kit)     Status: None   Collection Time: 04/01/21  4:59 PM  Result Value Ref Range   SARS Coronavirus 2 Ag  Negative Negative  Resp Panel by RT-PCR (Flu A&B, Covid) Nasopharyngeal Swab     Status: None   Collection Time: 04/01/21  5:00 PM   Specimen: Nasopharyngeal Swab; Nasopharyngeal(NP) swabs in vial  transport medium  Result Value Ref Range   SARS Coronavirus 2 by RT PCR NEGATIVE NEGATIVE    Comment: (NOTE) SARS-CoV-2 target nucleic acids are NOT DETECTED.  The SARS-CoV-2 RNA is generally detectable in upper respiratory specimens during the acute phase of infection. The lowest concentration of SARS-CoV-2 viral copies this assay can detect is 138 copies/mL. A negative result does not preclude SARS-Cov-2 infection and should not be used as the sole basis for treatment or other patient management decisions. A negative result may occur with  improper specimen collection/handling, submission of specimen other than nasopharyngeal swab, presence of viral mutation(s) within the areas targeted by this assay, and inadequate number of viral copies(<138 copies/mL). A negative result must be combined with clinical observations, patient history, and epidemiological information. The expected result is Negative.  Fact Sheet for Patients:  EntrepreneurPulse.com.au  Fact Sheet for Healthcare Providers:  IncredibleEmployment.be  This test is no t yet approved or cleared by the [place] FDA and  has been authorized for detection and/or diagnosis of SARS-CoV-2 by FDA under an Emergency Use Authorization (EUA). This EUA will remain  in effect (meaning this test can be used) for the duration of the COVID-19 declaration under Section 564(b)(1) of the Act, 21 U.S.C.section 360bbb-3(b)(1), unless the authorization is terminated  or revoked sooner.       Influenza A by PCR NEGATIVE NEGATIVE   Influenza B by PCR NEGATIVE NEGATIVE    Comment: (NOTE) The Xpert Xpress SARS-CoV-2/FLU/RSV plus assay is intended as an aid in the diagnosis of influenza from Nasopharyngeal swab specimens and should not be used as a sole basis for treatment. Nasal washings and aspirates are unacceptable for Xpert Xpress SARS-CoV-2/FLU/RSV testing.  Fact Sheet for  Patients: EntrepreneurPulse.com.au  Fact Sheet for Healthcare Providers: IncredibleEmployment.be  This test is not yet approved or cleared by the [place] FDA and has been authorized for detection and/or diagnosis of SARS-CoV-2 by FDA under an Emergency Use Authorization (EUA). This EUA will remain in effect (meaning this test can be used) for the duration of the COVID-19 declaration under Section 564(b)(1) of the Act, 21 U.S.C. section 360bbb-3(b)(1), unless the authorization is terminated or revoked.  Performed at [facility], [place] [address] [place], [place] [address]   POC SARS Coronavirus 2 Ag     Status: None   Collection Time: 04/01/21  5:06 PM  Result Value Ref Range   SARSCOV2ONAVIRUS 2 AG NEGATIVE NEGATIVE    Comment: (NOTE) SARS-CoV-2 antigen NOT DETECTED.   Negative results are presumptive.  Negative results do not preclude SARS-CoV-2 infection and should not be used as the sole basis for treatment or other patient management decisions, including infection  control decisions, particularly in the presence of clinical signs and  symptoms consistent with COVID-19, or in those who have been in contact with the virus.  Negative results must be combined with clinical observations, patient history, and epidemiological information. The expected result is Negative.  Fact Sheet for Patients: HandmadeRecipes.com.cy  Fact Sheet for Healthcare Providers: FuneralLife.at  This test is not yet approved or cleared by the [place] FDA and  has been authorized for detection and/or diagnosis of SARS-CoV-2 by FDA under an Emergency Use Authorization (EUA).  This EUA will remain in effect (meaning this test  can be used) for the duration of  the COV ID-19 declaration under Section 564(b)(1) of the Act, 21 U.S.C. section 360bbb-3(b)(1), unless the authorization is terminated or revoked  sooner.    Pregnancy, urine POC     Status: None   Collection Time: 04/01/21  5:09 PM  Result Value Ref Range   Preg Test, Ur NEGATIVE NEGATIVE    Comment:        THE SENSITIVITY OF THIS METHODOLOGY IS >24 mIU/mL     Blood Alcohol level:  Lab Results  Component Value Date   ETH <10 82/99/3716    Metabolic Disorder Labs: Lab Results  Component Value Date   HGBA1C 5.8 (H) 04/01/2021   MPG 119.76 04/01/2021   Lab Results  Component Value Date   PROLACTIN 186.0 (H) 04/01/2021   Lab Results  Component Value Date   CHOL 212 (H) 04/01/2021   TRIG 56 04/01/2021   HDL 72 04/01/2021   CHOLHDL 2.9 04/01/2021   VLDL 11 04/01/2021   LDLCALC 129 (H) 04/01/2021    Physical Findings: AIMS:  , ,  ,  ,    CIWA:    COWS:     Musculoskeletal: Strength & Muscle Tone: within normal limits Gait & Station: normal Patient leans: N/A  Psychiatric Specialty Exam:  Presentation  General Appearance: Appropriate for Environment; Casual  Eye Contact:Good  Speech:Clear and Coherent; Normal Rate  Speech Volume:Normal  Handedness:Right  Mood and Affect  Mood:Depressed  Affect:Congruent; Depressed  Thought Process  Thought Processes:Coherent; Goal Directed  Descriptions of Associations:Intact  Orientation:Full (Time, Place and Person)  Thought Content:Logical; WDL  History of Schizophrenia/Schizoaffective disorder:No data recorded Duration of Psychotic Symptoms:No data recorded Hallucinations:No data recorded Ideas of Reference:None  Suicidal Thoughts:No data recorded Homicidal Thoughts:No data recorded  Sensorium  Memory:Immediate Good; Recent Good; Remote Good  Judgment:Good  Insight:Good  Executive Functions  Concentration:Good  Attention Span:Good  [place] of Knowledge:Good  Language:Good  Psychomotor Activity  Psychomotor Activity: No data recorded  Assets  Assets:Communication Skills; Desire for Improvement; Financial  Resources/Insurance; Housing; Intimacy; Leisure Time; Physical Health; Resilience; Social Support; Talents/Skills; Transportation  Sleep  Sleep: No data recorded  Physical Exam: Physical Exam Vitals and nursing note reviewed.  Constitutional:      Appearance: Normal appearance.  HENT:     Head: Normocephalic.  Pulmonary:     Effort: Pulmonary effort is normal.  Musculoskeletal:        General: Normal range of motion.     Cervical back: Normal range of motion.  Neurological:     General: No focal deficit present.     Mental Status: She is alert and oriented to person, place, and time.  Psychiatric:        Attention and Perception: Attention normal. She does not perceive auditory or visual hallucinations.        Mood and Affect: Mood is depressed.        Speech: Speech normal.        Behavior: Behavior normal. Behavior is cooperative.        Thought Content: Thought content is not paranoid or delusional. Thought content does not include homicidal or suicidal ideation. Thought content does not include homicidal or suicidal plan.        Cognition and Memory: Cognition normal.   Review of Systems  Constitutional:  Negative for fever.  HENT:  Negative for congestion, sinus pain and sore throat.   Respiratory:  Negative for cough and shortness of breath.  Cardiovascular:  Negative for chest pain.  Gastrointestinal: Negative.   Genitourinary: Negative.   Musculoskeletal: Negative.   Neurological: Negative.   Blood pressure 113/83, pulse 68, temperature 97.9 F (36.6 C), temperature source Oral, resp. rate 16, height _0  (1.753 m), weight 110 kg, SpO2 100 %, currently breastfeeding. Body mass index is 35.81 kg/m.   Treatment Plan Summary: Daily contact with patient to assess and evaluate symptoms and progress in treatment and Medication management  Depression:  -Increase Zoloft to 50 mg daily starting 04/04/2021  Insomnia: -Continue melatonin 3 mg PO at bedtime  PRN  Hypothyroidism: -Restart home medication Synthroid 150 mcg PO daily  Continue every 15 minute safety checks Encourage participation in the therapeutic milieu Discharge planning in progress-patient will likely be discharged on Sunday after safety planning is completed.    Ethelene Hal, NP 04/03/2021, 1:11 PM

## 2021-04-03 NOTE — Progress Notes (Signed)
   04/03/21 1100  Psych Admission Type (Psych Patients Only)  Admission Status Voluntary  Psychosocial Assessment  Patient Complaints None  Eye Contact Fair  Facial Expression Flat  Affect Depressed;Appropriate to circumstance  Speech Logical/coherent;Soft  Interaction Assertive  Motor Activity Other (Comment) (WDL)  Appearance/Hygiene Unremarkable  Behavior Characteristics Cooperative  Mood Pleasant  Thought Process  Coherency WDL  Content WDL  Delusions None reported or observed  Perception WDL  Hallucination None reported or observed  Judgment WDL  Confusion None  Danger to Self  Current suicidal ideation? Denies  Danger to Others  Danger to Others None reported or observed

## 2021-04-04 MED ORDER — SERTRALINE HCL 50 MG PO TABS: 50.0000 mg | ORAL_TABLET | Freq: Every day | ORAL | 0 refills | Status: AC

## 2021-04-04 MED ORDER — MELATONIN 3 MG PO TABS: 3.0000 mg | ORAL_TABLET | Freq: Every evening | ORAL | 0 refills | Status: AC | PRN

## 2021-04-04 NOTE — [facility] Suicide Risk Assessment (Signed)
[facility] INPATIENT:  Family/Significant Other Suicide Prevention Education  Suicide Prevention Education:  Education Completed; Morgan Nielsen (husband) [phone], has been identified by the patient as the family member/significant other with whom the patient will be residing, and identified as the person(s) who will aid the patient in the event of Morgan mental health crisis (suicidal ideations/suicide attempt).  With written consent from the patient, the family member/significant other has been provided the following suicide prevention education, prior to the and/or following the discharge of the patient.   CSW spoke with pt's husband who reported he would be picking this pt up at discharge.  Pt's husband stated he has hidden all of the medications and will be hiding his gun and ammunition in different places before he picks this pt up.    The suicide prevention education provided includes the following: Suicide risk factors Suicide prevention and interventions National Suicide Hotline telephone number [facility] assessment telephone number [facility] Emergency Assistance 911 [facility] and/or Residential Mobile Crisis Unit telephone number  Request made of family/significant other to: Remove weapons (e.g., guns, rifles, knives), all items previously/currently identified as safety concern.   Remove drugs/medications (over-the-counter, prescriptions, illicit drugs), all items previously/currently identified as Morgan safety concern.  The family member/significant other verbalizes understanding of the suicide prevention education information provided.  The family member/significant other agrees to remove the items of safety concern listed above.  Morgan Nielsen Morgan Nielsen 04/04/2021, 10:02 AM

## 2021-04-04 NOTE — Progress Notes (Signed)
Discharge Note:  Patient denies SI/HI at this time. Discharge instructions, AVS, prescriptions gone over with patient and family. Patient agrees to comply with medication management, follow-up visit, and outpatient therapy. Patient and family questions and concerns addressed and answered. Patient discharged to home with parents.     

## 2021-04-04 NOTE — [facility] Suicide Risk Assessment (Signed)
[facility] INPATIENT:  Family/Significant Other Suicide Prevention Education  Suicide Prevention Education:  Contact Attempts: [name] (husband) [phone], has been identified by the patient as the family member/significant other with whom the patient will be residing, and identified as the person(s) who will aid the patient in the event of a mental health crisis.  With written consent from the patient, two attempts were made to provide suicide prevention education, prior to and/or following the patient's discharge.  We were unsuccessful in providing suicide prevention education.  A suicide education pamphlet was given to the patient to share with family/significant other.  Date and time of first attempt: 04/04/2021/9:40am CSW left HIPAA compliant voicemail for this pt's spouse.  Date and time of second attempt: Second attempt will be made before this pt discharges.      [name] 04/04/2021, 9:43 AM

## 2021-04-04 NOTE — Discharge Summary (Signed)
Physician Discharge Summary Note  Patient:  Morgan Nielsen is an 35 y.o., female MRN:  [id number] DOB:  04-11-1986 Patient phone:  [phone] (home)  Patient address:   [address] [place] [id number],  Total Time spent with patient: 30 minutes  Date of Admission:  04/01/2021 Date of Discharge: 04/04/2021  Reason for Admission:  (From MD's admission note): Patient is a 35 year old female who [facility] with worsening depression, increased anxiety, and suicidal thoughts with a plan to overdose on phenobarb (has access to med due to dog having seizures), valium, and other medications available in the home. She reports that she is 4 months postpartum and has been having depressive symptoms since the birth of her child. She reports that for the last 10-14 days she has been feeling progressively more depressed. On admission she reported associate neurovegetative symptoms of depression including decreased appetite, poor focus, increased desire to sleep, anhedonia, and hopelessness. The patient denies AVH or h/o mania/hypomania. She denies drug or alcohol use. She denies previous psychiatric inpatient admissions or previous suicide attempts. She reportedly took Lexapro in 2018 for an acute stress reaction but had sexual side-effects and discontinued the medication. She reported has a h/o alcoholism and mental health issues in the family. She is breastfeeding.   Principal Problem: MDD (major depressive disorder), single episode, severe (HCC) Discharge Diagnoses: Principal Problem:   MDD (major depressive disorder), single episode, severe (HCC)   Past Psychiatric History: See H&P  Past Medical History:  Past Medical History:  Diagnosis Date   Allergy    Anemia    Asthma    Clotting disorder (HCC)    per bloodwork not clotting disorder   Family history of blood clots 09/13/2011   Family history of blood disorder 09/13/2011    Headache(784.0)    migraines   Hypertension    Hypothyroidism 09/13/2011   Obese 09/13/2011   Pregnancy induced hypertension    Pulmonary embolism (HCC) 05/09/2011   Religious or spiritual beliefs affecting medical care 09/13/2011   doesnt accept blood    Past Surgical History:  Procedure Laterality Date   BARIATRIC SURGERY  2016   CESAREAN SECTION N/A 12/07/2013   Procedure: CESAREAN SECTION;  Surgeon: Esmeralda ArthurSandra A Rivard, MD;  Location: [facility];  Service: Obstetrics;  Laterality: N/A;   WISDOM TOOTH EXTRACTION     WRIST SURGERY     left torn cartilage 2007   Family History:  Family History  Problem Relation Age of Onset   Hyperlipidemia Mother    Hypertension Mother    Pulmonary embolism Mother    Diabetes Father    Hyperlipidemia Father    Hypertension Father    Cancer Maternal Grandfather        prostate   Pulmonary embolism Maternal Aunt    Deep vein thrombosis Maternal Uncle    Cancer Paternal Aunt        uterine   Pulmonary embolism Maternal Aunt    Pulmonary embolism Maternal Aunt    Congestive Heart Failure Maternal Grandmother    Alcohol abuse Paternal Grandfather    Family Psychiatric  History: See H&P Social History:  Social History   Substance and Sexual Activity  Alcohol Use No   Alcohol/week: 1.0 - 2.0 standard drink   Types: 1 - 2 Standard drinks or equivalent per week     Social History   Substance and Sexual Activity  Drug Use No    Social History   Socioeconomic  History   Marital status: Married    Spouse name: Not on file   Number of children: Not on file   Years of education: Not on file   Highest education level: Not on file  Occupational History   Not on file  Tobacco Use   Smoking status: Never   Smokeless tobacco: Never  Vaping Use   Vaping Use: Never used  Substance and Sexual Activity   Alcohol use: No    Alcohol/week: 1.0 - 2.0 standard drink    Types: 1 - 2 Standard drinks or equivalent per week   Drug use: No   Sexual  activity: Not Currently    Partners: Male    Birth control/protection: None  Other Topics Concern   Not on file  Social History Narrative   Not on file   Social Determinants of Health   Financial Resource Strain: Low Risk    Difficulty of Paying Living Expenses: Not hard at all  Food Insecurity: No Food Insecurity   Worried About Programme researcher, broadcasting/film/video in the Last Year: Never true   Ran Out of Food in the Last Year: Never true  Transportation Needs: No Transportation Needs   Lack of Transportation (Medical): No   Lack of Transportation (Non-Medical): No  Physical Activity: Insufficiently Active   Days of Exercise per Week: 3 days   Minutes of Exercise per Session: 20 min  Stress: No Stress Concern Present   Feeling of Stress : Only a little  Social Connections: Press photographer of Communication with Friends and Family: More than three times a week   Frequency of Social Gatherings with Friends and Family: Twice a week   Attends Religious Services: More than 4 times per year   Active Member of [employer] or Organizations: Yes   Attends Engineer, structural: More than 4 times per year   Marital Status: Married    Hospital Course:  After the above admission evaluation, Morgan Nielsen's  presenting symptoms were noted. She was recommended for mood stabilization treatments. The medication regimen targeting those presenting symptoms were discussed with her & initiated with her consent. She was started on Zoloft for depression. The risks and benefits of taking an antidepressant and breastfeeding were discussed with her. Her UDS on arrival to the ED was negative, BAL was negative. She was however medicated, stabilized & discharged on the medications as listed on her discharge medication list below. Besides the mood stabilization treatments, Morgan Nielsen was also enrolled & participated in the group counseling sessions being offered & held on this unit. She learned coping skills. She  presented no other significant pre-existing medical issues that required treatment. She tolerated his treatment regimen without any adverse effects or reactions reported.   During the course of her hospitalization, the 15-minute checks were adequate to ensure patient's safety. Morgan Nielsen did not display any dangerous, violent or suicidal behavior on the unit.  She interacted with patients & staff appropriately, participated appropriately in the group sessions/therapies. Her medications were addressed & adjusted to meet her needs. She was recommended for outpatient follow-up care & medication management upon discharge to assure continuity of care & mood stability.  At the time of discharge patient is not reporting any acute suicidal/homicidal ideations. She feels more confident about her self-care & in managing his mental health. She currently denies any new issues or concerns. Education and supportive counseling provided throughout her hospital stay & upon discharge.   Today upon her discharge evaluation with the attending  psychiatrist, Morgan Nielsen shares she is doing well and feels ready for discharge. She denies any other specific concerns. Her is sleep has improved. Her appetite is good. She denies other physical complaints. She denies AH/VH, delusional thoughts or paranoia. She does not appear to be responding to any internal stimuli. She feels that her medications have been helpful & is in agreement to continue her current treatment regimen as recommended. She was able to engage in safety planning including plan to return to [facility] or contact emergency services if she feels unable to maintain his/her own safety or the safety of others. Pt had no further questions, comments, or concerns. She left [facility] with all personal belongings in no apparent distress. Transportation per private vehicle with her husband.    Physical Findings: AIMS:  , ,  ,  ,    CIWA:    COWS:     Musculoskeletal: Strength & Muscle Tone:  within normal limits Gait & Station: normal Patient leans: N/A  Psychiatric Specialty Exam:  Presentation  General Appearance: Appropriate for Environment; Casual; Fairly Groomed  Eye Contact:Good  Speech:Clear and Coherent; Normal Rate  Speech Volume:Normal  Handedness:Right  Mood and Affect  Mood:Depressed  Affect:Appropriate; Congruent  Thought Process  Thought Processes:Coherent; Goal Directed; Linear  Descriptions of Associations:Intact  Orientation:Full (Time, Place and Person)  Thought Content:Logical  History of Schizophrenia/Schizoaffective disorder:No data recorded Duration of Psychotic Symptoms:No data recorded Hallucinations:Hallucinations: None  Ideas of Reference:None  Suicidal Thoughts:Suicidal Thoughts: No  Homicidal Thoughts:Homicidal Thoughts: No  Sensorium  Memory:Immediate Good; Recent Good; Remote Good  Judgment:Good  Insight:Good  Executive Functions  Concentration:Good  Attention Span:Good  Recall:Good  Fund of Knowledge:Good  Language:Good  Psychomotor Activity  Psychomotor Activity:Psychomotor Activity: Normal  Assets  Assets:Communication Skills; Desire for Improvement; Financial Resources/Insurance; Housing; Leisure Time; Physical Health; Social Support; English as a second language teacher; Vocational/Educational; Resilience  Sleep  Sleep:Sleep: Fair Number of Hours of Sleep: 5.25  Physical Exam: Physical Exam Vitals and nursing note reviewed.  Constitutional:      Appearance: Normal appearance.  HENT:     Head: Normocephalic.  Pulmonary:     Effort: Pulmonary effort is normal.  Musculoskeletal:        General: Normal range of motion.     Cervical back: Normal range of motion.  Neurological:     General: No focal deficit present.     Mental Status: She is alert and oriented to person, place, and time.  Psychiatric:        Attention and Perception: Attention and perception normal.        Mood and Affect: Mood normal.         Speech: Speech normal.        Behavior: Behavior normal. Behavior is cooperative.        Thought Content: Thought content normal. Thought content does not include suicidal ideation. Thought content does not include suicidal plan.        Cognition and Memory: Cognition normal.   Review of Systems  Constitutional:  Negative for fever.  HENT:  Negative for congestion, sinus pain and sore throat.   Respiratory: Negative.  Negative for cough and shortness of breath.   Cardiovascular: Negative.  Negative for chest pain.  Gastrointestinal: Negative.   Genitourinary: Negative.   Musculoskeletal: Negative.   Neurological: Negative.   Blood pressure 124/82, pulse (!) 59, temperature 97.9 F (36.6 C), temperature source Oral, resp. rate 16, height 5\' 9"  (1.753 m), weight 110 kg, SpO2 100 %, currently breastfeeding. Body mass index is  35.81 kg/m.   Social History   Tobacco Use  Smoking Status Never  Smokeless Tobacco Never   Tobacco Cessation:  N/A, patient does not currently use tobacco products   Blood Alcohol level:  Lab Results  Component Value Date   ETH <10 04/01/2021    Metabolic Disorder Labs:  Lab Results  Component Value Date   HGBA1C 5.8 (H) 04/01/2021   MPG 119.76 04/01/2021   Lab Results  Component Value Date   PROLACTIN 186.0 (H) 04/01/2021   Lab Results  Component Value Date   CHOL 212 (H) 04/01/2021   TRIG 56 04/01/2021   HDL 72 04/01/2021   CHOLHDL 2.9 04/01/2021   VLDL 11 04/01/2021   LDLCALC 129 (H) 04/01/2021    See Psychiatric Specialty Exam and Suicide Risk Assessment completed by Attending Physician prior to discharge.  Discharge destination:  Home  Is patient on multiple antipsychotic therapies at discharge:  No   Has Patient had three or more failed trials of antipsychotic monotherapy by history:  No  Recommended Plan for Multiple Antipsychotic Therapies: NA  Discharge Instructions     Diet - low sodium heart healthy   Complete by: As  directed    Increase activity slowly   Complete by: As directed       Allergies as of 04/04/2021       Reactions   Shellfish Allergy Swelling        Medication List     STOP taking these medications    acetaminophen 325 MG tablet Commonly known as: TYLENOL   COLACE PO       TAKE these medications      Indication  albuterol 108 (90 Base) MCG/ACT inhaler Commonly known as: VENTOLIN HFA Inhale 2 puffs into the lungs every 6 (six) hours as needed for wheezing or shortness of breath.  Indication: Exercise-Induced Bronchospastic Disease   levothyroxine 150 MCG tablet Commonly known as: SYNTHROID Take 1 tablet (150 mcg total) by mouth daily before breakfast.  Indication: Underactive Thyroid   MAGNESIUM PO Take 400 mg by mouth 3 (three) times daily.  Indication: supplement   melatonin 3 MG Tabs tablet Take 1 tablet (3 mg total) by mouth at bedtime as needed.  Indication: Trouble Sleeping   prenatal multivitamin Tabs tablet Take 1 tablet by mouth at bedtime.  Indication: Vitamin Deficiency   sertraline 50 MG tablet Commonly known as: ZOLOFT Take 1 tablet (50 mg total) by mouth daily. Start taking on: April 05, 2021  Indication: Major Depressive Disorder   VITAMIN A PO Take by mouth.  Indication: supplement   VITAMIN D PO Take 2,000 Units by mouth daily.  Indication: supplement   ZYRTEC PO Take by mouth.  Indication: seasonal allergies        Follow-up Information     Services, [facility]. Go to.   Why: Walk-in or call to schedule intake appointment for medication management and therapy services. Contact information: [address] [place] [address] [phone]         Health, [facility]. Call.   Specialty: Behavioral Health Why: Call to schedule an appointment for PHP or IOP. Contact information: [address] [place] [address] [phone]                 Follow-up recommendations:   Activity:  as tolerated Diet:  heart healthy  Comments:  Prescriptions given at discharge.  Patient is agreeable with the discharge  plan.  She was given an  opportunity to ask questions.  She appears to feel comfortable with discharge and denies any current suicidal or homicidal thoughts.   Patient is instructed prior to discharge to: Take all medications as prescribed by her mental healthcare provider. Report any adverse effects and or reactions from the medicines to her outpatient provider promptly. Patient has been instructed & cautioned: To not engage in alcohol and or illegal drug use while on prescription medicines. In the event of worsening symptoms, patient is instructed to call the crisis hotline, 911 and or go to the nearest ED for appropriate evaluation and treatment of symptoms. To follow-up with her primary care provider for your other medical issues, concerns and or health care needs.   Signed: Laveda Abbe, NP 04/04/2021, 10:52 AM

## 2021-04-04 NOTE — BHH Suicide Risk Assessment (Signed)
[facility] Discharge Suicide Risk Assessment   Principal Problem: MDD (major depressive disorder), single episode, severe (HCC) Discharge Diagnoses: Principal Problem:   MDD (major depressive disorder), single episode, severe (HCC)  Total Time Spent in Direct Patient Care:  I personally spent 30 minutes on the unit in direct patient care. The direct patient care time included face-to-face time with the patient, reviewing the patient's chart, communicating with other professionals, and coordinating care. Greater than 50% of this time was spent in counseling or coordinating care with the patient regarding goals of hospitalization, psycho-education, and discharge planning needs.  Subjective: Patient was seen on rounds. She denies SI, HI, AVH or paranoia. She denies medication side-effects and voices no physical complaints. She specifically denies GI side-effects with her Zoloft. She states her mood is stable and improved and she requests discharge today. She can articulate a safety plan and states her husband has removed pills from the home and secured firearms. She reports fair sleep overnight and improved appetite. She agrees to have her mother or husband stay with her after discharge. Time was spent reviewing her use of Zoloft in breastfeeding. She was advised to let her baby's pediatrician know she is now on Zoloft and to watch baby for signs of difficulty feeding or gaining weight, fussiness/agitation, sedation/changes in sleep, or GI symptoms, and to monitor her milk production while on the medication. Time was given for questions.   Musculoskeletal: Strength & Muscle Tone: within normal limits Gait & Station: normal, steady Patient leans: N/A   Psychiatric Specialty Exam: Physical Exam Vitals reviewed. HENT:    Head: Normocephalic. Pulmonary:    Effort: Pulmonary effort is normal. Neurological:    General: No focal deficit present.    Mental Status: She is alert.      General Appearance:   casually dressed, well groomed  Eye Contact:  Good  Speech:  Clear and Coherent and Normal Rate  Volume:  Normal  Mood:  described as improving - appears more euthymic  Affect:  brighter, moderate, stable  Thought Process:  Goal Directed and Linear  Orientation:  Full (Time, Place, and Person)  Thought Content:  Logical and no evidence of AVH, paranoia or delusions  Suicidal Thoughts:  Denied  Homicidal Thoughts:  Denied  Memory:  Recent;   Good  Judgement:  Good  Insight:  Present  Psychomotor Activity:  Normal  Concentration:  Concentration: Good and Attention Span: Good  Recall:  Good  Fund of Knowledge:  Good  Language:  Good  Akathisia:  Negative  Assets:  Communication Skills Desire for Improvement Housing Resilience Social Support Vocational/Educational  ADL's:  Intact  Cognition:  WNL    Blood pressure 124/82, pulse (!) 59, temperature 97.9 F (36.6 C), temperature source Oral, resp. rate 16, height 5\' 9"  (1.753 m), weight 110 kg, SpO2 100 %, currently breastfeeding. Body mass index is 35.81 kg/m.  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 May be classified as minimal risk based  on the severity of the depressive symptoms  Plan Of Care/Follow-up recommendations:  Activity:  as tolerated Diet:  heart healthy Other:  Patient advised to keep scheduled outpatient follow up appointments for mental  health care and to comply with medication. She was advised to monitor her milk production while on Zoloft and to notify her baby's pediatrician that she is now breastfeeding on Zoloft. She was advised to watch baby for difficulty feeding/difficulty gaining weight, fussiness/agitation, sleep changes, or GI symptoms.   Comer Locket, MD, FAPA 04/04/2021, 7:15 AM

## 2021-04-04 NOTE — Progress Notes (Signed)
Patient has been up and active on the unit, attended group this evening and has voiced no complaints. Patient currently denies having pain, -si/hi/a/v hall. Support and encouragement offered, safety maintained on unit, will continue to monitor.  

## 2021-04-04 NOTE — Progress Notes (Signed)
  [facility] Adult Case Management Discharge Plan :  Will you be returning to the same living situation after discharge:  Yes,  to home At discharge, do you have transportation home?: Yes,  husband is to pick this pt up Do you have the ability to pay for your medications: Yes,  has insurance  Release of information consent forms completed and in the chart;  Patient's signature needed at discharge.  Patient to Follow up at:  Follow-up Information     Services, [facility]. Go to.   Why: Walk-in or call to schedule intake appointment for medication management and therapy services. Contact information: [address] [place] [address] [phone]         Health, [facility]. Call.   Specialty: Behavioral Health Why: Call to schedule an appointment for PHP or IOP. Contact information: [address] [place] [address] [phone]                 Next level of care provider has access to [facility] Link:no  Safety Planning and Suicide Prevention discussed: Yes,  with husband     Has patient been referred to the Quitline?: N/A patient is not a smoker  Patient has been referred for addiction treatment: N/A  Otelia Santee, LCSW 04/04/2021, 9:58 AM

## 2021-04-04 NOTE — Plan of Care (Signed)
  Problem: Education: Goal: Emotional status will improve Outcome: Progressing Goal: Mental status will improve Outcome: Progressing   

## 2021-04-04 NOTE — BHH Group Notes (Signed)
Patient did not attend morning goals group, although she was made aware of it.  

## 2021-04-14 NOTE — H&P (Signed)
Physical Exam Constitutional:      Appearance: Normal appearance. HENT:    Head: Normocephalic and atraumatic.    Mouth/Throat:    Mouth: Mucous membranes are moist. Cardiovascular:    Rate and Rhythm: Normal rate and regular rhythm.    Pulses: Normal pulses.    Heart sounds: Normal heart sounds. Pulmonary:    Effort: Pulmonary effort is normal.    Breath sounds: Normal breath sounds. Abdominal:    General: Abdomen is flat. Bowel sounds are normal.    Palpations: Abdomen is soft. Skin:    General: Skin is warm.   Neurological:    General: No focal deficit present.    Mental Status: He is alert. Psychiatric:        Mood and Affect: Mood normal.        Behavior: Behavior normal.   ROS: Negative  Physical Exam and ROS missing or incomplete. Please see HPI submitted on 04/01/2021 for additional intake information.

## 2021-06-17 ENCOUNTER — Other Ambulatory Visit: Payer: Self-pay | Admitting: Women's Health

## 2022-03-12 ENCOUNTER — Other Ambulatory Visit: Payer: Self-pay | Admitting: Women's Health

## 2022-03-25 ENCOUNTER — Ambulatory Visit
Admission: EM | Admit: 2022-03-25 | Discharge: 2022-03-25 | Disposition: A | Discharge status: Home / Self Care | Payer: Medicaid Other | Attending: Family Medicine | Admitting: Family Medicine

## 2022-03-25 ENCOUNTER — Ambulatory Visit (INDEPENDENT_AMBULATORY_CARE_PROVIDER_SITE_OTHER): Payer: Medicaid Other

## 2022-03-25 DIAGNOSIS — M7731 Calcaneal spur, right foot: Secondary | ICD-10-CM | POA: Diagnosis not present

## 2022-03-25 DIAGNOSIS — M79671 Pain in right foot: Secondary | ICD-10-CM | POA: Diagnosis not present

## 2022-03-25 NOTE — ED Provider Notes (Signed)
[facility]    CSN: [id number] Arrival date & time: 03/25/22  1639      History   Chief Complaint Chief Complaint  Patient presents with   Foot Pain    HPI Morgan Nielsen is a 36 y.o. female.   Presenting today with 1 week of persistent right lateral foot pain radiating up toward ankle at times after stepping off of the porch onto an uneven rock and twisting the ankle inward.  She denies significant swelling, discoloration, numbness, tingling, loss of range of motion.  Has been icing, elevating, ibuprofen and Tylenol with no relief and states pain may be even be worse now than it was initially.    Past Medical History:  Diagnosis Date   Allergy    Anemia    Asthma    Clotting disorder (HCC)    per bloodwork not clotting disorder   Family history of blood clots 09/13/2011   Family history of blood disorder 09/13/2011   Headache(784.0)    migraines   Hypertension    Hypothyroidism 09/13/2011   Obese 09/13/2011   Pregnancy induced hypertension    Pulmonary embolism (HCC) 05/09/2011   Religious or spiritual beliefs affecting medical care 09/13/2011   doesnt accept blood    Patient Active Problem List   Diagnosis Date Noted   MDD (major depressive disorder), single episode, severe (HCC) 04/01/2021   Postpartum hypertension 01/11/2021   History of vaginal delivery following previous cesarean delivery 05/20/2020   Patient is Jehovah's Witness 10/26/2017   History of bariatric surgery 06/23/2017   Rh negative state in antepartum period 05/16/2017   History of gestational hypertension 12/07/2013   Hypothyroidism 09/13/2011   Obese 09/13/2011   Family history of blood disorder 09/13/2011   Pulmonary embolism (HCC) 05/09/2011    Past Surgical History:  Procedure Laterality Date   BARIATRIC SURGERY  2016   CESAREAN SECTION N/A 12/07/2013   Procedure: CESAREAN SECTION;  Surgeon: Esmeralda Arthur, MD;  Location: [facility];  Service: Obstetrics;  Laterality: N/A;    WISDOM TOOTH EXTRACTION     WRIST SURGERY     left torn cartilage 2007    OB History     Gravida  3   Para  3   Term  3   Preterm  0   AB  0   Living  3      SAB  0   IAB  0   Ectopic  0   Multiple  0   Live Births  3            Home Medications    Prior to Admission medications   Medication Sig Start Date End Date Taking? Authorizing Provider  albuterol (VENTOLIN HFA) 108 (90 Base) MCG/ACT inhaler Inhale 2 puffs into the lungs every 6 (six) hours as needed for wheezing or shortness of breath.    [provider]  Cetirizine HCl (ZYRTEC PO) Take by mouth.    [provider]  MAGNESIUM PO Take 400 mg by mouth 3 (three) times daily.    [provider]  melatonin 3 MG TABS tablet Take 1 tablet (3 mg total) by mouth at bedtime as needed. 04/04/21   Laveda Abbe, NP  Prenatal Vit-Fe Fumarate-FA (PRENATAL MULTIVITAMIN) TABS tablet Take 1 tablet by mouth at bedtime.    [provider]  sertraline (ZOLOFT) 50 MG tablet Take 1 tablet (50 mg total) by mouth daily. 04/05/21   Laveda Abbe, NP  SYNTHROID  150 MCG tablet TAKE 1 TABLET BY MOUTH ONCE DAILY BEFORE BREAKFAST. 03/14/22   Cheral Marker, CNM  VITAMIN A PO Take by mouth.    [provider]  VITAMIN D PO Take 2,000 Units by mouth daily.    [provider]    Family History Family History  Problem Relation Age of Onset   Hyperlipidemia Mother    Hypertension Mother    Pulmonary embolism Mother    Diabetes Father    Hyperlipidemia Father    Hypertension Father    Cancer Maternal Grandfather        prostate   Pulmonary embolism Maternal Aunt    Deep vein thrombosis Maternal Uncle    Cancer Paternal Aunt        uterine   Pulmonary embolism Maternal Aunt    Pulmonary embolism Maternal Aunt    Congestive Heart Failure Maternal Grandmother    Alcohol abuse Paternal Grandfather     Social History Social History   Tobacco Use    Smoking status: Never   Smokeless tobacco: Never  Vaping Use   Vaping Use: Never used  Substance Use Topics   Alcohol use: Yes    Alcohol/week: 1.0 - 2.0 standard drink of alcohol    Types: 1 - 2 Standard drinks or equivalent per week    Comment: Occas   Drug use: No     Allergies   Shellfish allergy   Review of Systems Review of Systems Per HPI  Physical Exam Triage Vital Signs ED Triage Vitals  Enc Vitals Group     BP 03/25/22 1712 131/76     Pulse Rate 03/25/22 1712 89     Resp 03/25/22 1712 18     Temp 03/25/22 1712 98.5 F (36.9 C)     Temp Source 03/25/22 1712 Oral     SpO2 03/25/22 1712 99 %     Weight --      Height --      Head Circumference --      Peak Flow --      Pain Score 03/25/22 1715 8     Pain Loc --      Pain Edu? --      Excl. in GC? --    No data found.  Updated Vital Signs BP 131/76 (BP Location: Right Arm)   Pulse 89   Temp 98.5 F (36.9 C) (Oral)   Resp 18   LMP 03/09/2022 (Exact Date)   SpO2 99%   Breastfeeding No   Visual Acuity Right Eye Distance:   Left Eye Distance:   Bilateral Distance:    Right Eye Near:   Left Eye Near:    Bilateral Near:     Physical Exam Vitals and nursing note reviewed.  Constitutional:      Appearance: Normal appearance. She is not ill-appearing.  HENT:     Head: Atraumatic.  Eyes:     Extraocular Movements: Extraocular movements intact.     Conjunctiva/sclera: Conjunctivae normal.  Cardiovascular:     Rate and Rhythm: Normal rate and regular rhythm.     Heart sounds: Normal heart sounds.  Pulmonary:     Effort: Pulmonary effort is normal.     Breath sounds: Normal breath sounds.  Musculoskeletal:        General: Tenderness and signs of injury present. No swelling or deformity. Normal range of motion.     Cervical back: Normal range of motion and neck supple.     Comments: Right  lateral foot tender to palpation diffusely.  Range of motion full and intact.  Mildly antalgic gait   Skin:    General: Skin is warm and dry.     Findings: No bruising or erythema.  Neurological:     Mental Status: She is alert and oriented to person, place, and time.     Motor: No weakness.     Gait: Gait normal.     Comments: Right foot neurovascularly intact  Psychiatric:        Mood and Affect: Mood normal.        Thought Content: Thought content normal.        Judgment: Judgment normal.      UC Treatments / Results  Labs (all labs ordered are listed, but only abnormal results are displayed) Labs Reviewed - No data to display  EKG   Radiology DG Foot Complete Right  Result Date: 03/25/2022 CLINICAL DATA:  Right lateral foot pain for 1 week after twisting injury. EXAM: RIGHT FOOT COMPLETE - 3+ VIEW COMPARISON:  Right foot radiographs 03/30/2009 FINDINGS: Normal bone mineralization. Normal-variant type 2 os naviculare. Small plantar calcaneal heel spur is new from prior. Minimal early spurring at the Achilles insertion on the calcaneus. No acute fracture or dislocation. IMPRESSION: 1. No acute fracture. 2. Small plantar calcaneal heel spur. Electronically Signed   By: Neita Garnet M.D.   On: 03/25/2022 17:36    Procedures Procedures (including critical care time)  Medications Ordered in UC Medications - No data to display  Initial Impression / Assessment and Plan / UC Course  I have reviewed the triage vital signs and the nursing notes.  Pertinent labs & imaging results that were available during my care of the patient were reviewed by me and considered in my medical decision making (see chart for details).     X-ray of the right foot negative for acute fracture in the area of her pain.  She does have a calcaneal spur which I noted resulted patient and that this was an incidental finding.  Ace wrap applied in clinic prior to discharge, discussed RICE protocol, over-the-counter pain relievers.  Follow-up with podiatry if worsening or not resolving.  Final Clinical  Impressions(s) / UC Diagnoses   Final diagnoses:  Right foot pain  Calcaneal spur of right foot   Discharge Instructions   None    ED Prescriptions   None    PDMP not reviewed this encounter.   Particia Nearing, [place] 03/25/22 1747

## 2022-03-25 NOTE — ED Triage Notes (Signed)
Pt states that last Thursday she twisted her right foot and ankle stepping off of the porch  Pt states that she has tried Tylenol and Ibuprofen without any relief

## 2022-04-20 DIAGNOSIS — Z6841 Body Mass Index (BMI) 40.0 and over, adult: Secondary | ICD-10-CM | POA: Diagnosis not present

## 2022-05-10 DIAGNOSIS — E039 Hypothyroidism, unspecified: Secondary | ICD-10-CM | POA: Diagnosis not present

## 2022-05-10 DIAGNOSIS — K2950 Unspecified chronic gastritis without bleeding: Secondary | ICD-10-CM | POA: Diagnosis not present

## 2022-05-10 DIAGNOSIS — Z6841 Body Mass Index (BMI) 40.0 and over, adult: Secondary | ICD-10-CM | POA: Diagnosis not present

## 2022-05-10 DIAGNOSIS — Z86711 Personal history of pulmonary embolism: Secondary | ICD-10-CM | POA: Diagnosis not present

## 2022-05-10 DIAGNOSIS — K2100 Gastro-esophageal reflux disease with esophagitis, without bleeding: Secondary | ICD-10-CM | POA: Diagnosis not present

## 2022-05-10 DIAGNOSIS — Z9884 Bariatric surgery status: Secondary | ICD-10-CM | POA: Diagnosis not present

## 2022-05-10 DIAGNOSIS — K259 Gastric ulcer, unspecified as acute or chronic, without hemorrhage or perforation: Secondary | ICD-10-CM | POA: Diagnosis not present

## 2022-05-10 DIAGNOSIS — E669 Obesity, unspecified: Secondary | ICD-10-CM | POA: Diagnosis not present

## 2022-06-20 DIAGNOSIS — M9904 Segmental and somatic dysfunction of sacral region: Secondary | ICD-10-CM | POA: Diagnosis not present

## 2022-06-20 DIAGNOSIS — M9905 Segmental and somatic dysfunction of pelvic region: Secondary | ICD-10-CM | POA: Diagnosis not present

## 2022-06-27 DIAGNOSIS — M9904 Segmental and somatic dysfunction of sacral region: Secondary | ICD-10-CM | POA: Diagnosis not present

## 2022-06-27 DIAGNOSIS — M9905 Segmental and somatic dysfunction of pelvic region: Secondary | ICD-10-CM | POA: Diagnosis not present
